# Patient Record
Sex: Female | Born: 1955
Health system: Southern US, Community
[De-identification: ages and names within clinical notes are randomized; demographics above are authoritative.]

## PROBLEM LIST (undated history)

## (undated) DIAGNOSIS — M62830 Muscle spasm of back: Secondary | ICD-10-CM

## (undated) DIAGNOSIS — I10 Essential (primary) hypertension: Secondary | ICD-10-CM

---

## 1984-11-30 DIAGNOSIS — R011 Cardiac murmur, unspecified: Secondary | ICD-10-CM

## 1984-11-30 HISTORY — DX: Cardiac murmur, unspecified: R01.1

## 1999-05-05 ENCOUNTER — Other Ambulatory Visit: Admission: RE | Admit: 1999-05-05 | Discharge: 1999-05-05 | Payer: Self-pay | Admitting: Gynecology

## 1999-07-08 ENCOUNTER — Ambulatory Visit (HOSPITAL_COMMUNITY): Admission: RE | Admit: 1999-07-08 | Discharge: 1999-07-08 | Payer: Self-pay | Admitting: Gynecology

## 1999-07-08 ENCOUNTER — Encounter (INDEPENDENT_AMBULATORY_CARE_PROVIDER_SITE_OTHER): Payer: Self-pay | Admitting: Specialist

## 1999-09-30 ENCOUNTER — Encounter: Payer: Self-pay | Admitting: Cardiology

## 1999-09-30 ENCOUNTER — Encounter: Payer: Self-pay | Admitting: Emergency Medicine

## 1999-09-30 ENCOUNTER — Inpatient Hospital Stay (HOSPITAL_COMMUNITY): Admission: EM | Admit: 1999-09-30 | Discharge: 1999-10-01 | Payer: Self-pay | Admitting: Emergency Medicine

## 2002-05-31 ENCOUNTER — Emergency Department (HOSPITAL_COMMUNITY): Admission: EM | Admit: 2002-05-31 | Discharge: 2002-06-01 | Payer: Self-pay

## 2002-06-22 ENCOUNTER — Other Ambulatory Visit: Admission: RE | Admit: 2002-06-22 | Discharge: 2002-06-22 | Payer: Self-pay | Admitting: Gynecology

## 2002-11-30 DIAGNOSIS — I10 Essential (primary) hypertension: Secondary | ICD-10-CM | POA: Insufficient documentation

## 2003-11-30 ENCOUNTER — Encounter: Payer: Self-pay | Admitting: Internal Medicine

## 2003-11-30 LAB — CONVERTED CEMR LAB

## 2004-10-07 ENCOUNTER — Encounter: Admission: RE | Admit: 2004-10-07 | Discharge: 2005-01-05 | Payer: Self-pay | Admitting: Internal Medicine

## 2004-10-28 ENCOUNTER — Ambulatory Visit: Payer: Self-pay | Admitting: Internal Medicine

## 2004-11-20 ENCOUNTER — Ambulatory Visit: Payer: Self-pay | Admitting: Internal Medicine

## 2005-01-06 ENCOUNTER — Ambulatory Visit: Payer: Self-pay | Admitting: Internal Medicine

## 2005-01-20 ENCOUNTER — Ambulatory Visit: Payer: Self-pay | Admitting: Internal Medicine

## 2005-01-29 ENCOUNTER — Encounter: Admission: RE | Admit: 2005-01-29 | Discharge: 2005-04-29 | Payer: Self-pay | Admitting: Internal Medicine

## 2005-05-19 ENCOUNTER — Ambulatory Visit: Payer: Self-pay | Admitting: Internal Medicine

## 2005-07-01 ENCOUNTER — Ambulatory Visit: Payer: Self-pay | Admitting: Internal Medicine

## 2005-10-05 ENCOUNTER — Ambulatory Visit: Payer: Self-pay | Admitting: Internal Medicine

## 2005-11-18 ENCOUNTER — Ambulatory Visit: Payer: Self-pay | Admitting: Internal Medicine

## 2005-11-30 DIAGNOSIS — E119 Type 2 diabetes mellitus without complications: Secondary | ICD-10-CM | POA: Insufficient documentation

## 2006-03-15 ENCOUNTER — Ambulatory Visit: Payer: Self-pay | Admitting: Internal Medicine

## 2006-04-06 ENCOUNTER — Ambulatory Visit: Payer: Self-pay | Admitting: Internal Medicine

## 2006-05-19 ENCOUNTER — Ambulatory Visit: Payer: Self-pay | Admitting: Internal Medicine

## 2006-05-27 ENCOUNTER — Ambulatory Visit: Payer: Self-pay | Admitting: Professional

## 2006-06-03 ENCOUNTER — Ambulatory Visit (HOSPITAL_COMMUNITY): Admission: RE | Admit: 2006-06-03 | Discharge: 2006-06-03 | Payer: Self-pay | Admitting: Internal Medicine

## 2006-06-03 ENCOUNTER — Ambulatory Visit: Payer: Self-pay | Admitting: Internal Medicine

## 2006-06-14 ENCOUNTER — Ambulatory Visit: Payer: Self-pay | Admitting: Professional

## 2006-06-28 ENCOUNTER — Ambulatory Visit: Payer: Self-pay | Admitting: Professional

## 2006-07-21 ENCOUNTER — Ambulatory Visit: Payer: Self-pay | Admitting: Internal Medicine

## 2006-08-05 ENCOUNTER — Encounter: Admission: RE | Admit: 2006-08-05 | Discharge: 2006-08-05 | Payer: Self-pay | Admitting: Occupational Medicine

## 2006-10-15 ENCOUNTER — Ambulatory Visit: Payer: Self-pay | Admitting: Internal Medicine

## 2006-11-30 DIAGNOSIS — Z780 Asymptomatic menopausal state: Secondary | ICD-10-CM | POA: Insufficient documentation

## 2006-12-01 ENCOUNTER — Ambulatory Visit: Payer: Self-pay | Admitting: Internal Medicine

## 2006-12-09 ENCOUNTER — Ambulatory Visit: Payer: Self-pay | Admitting: Professional

## 2007-01-07 ENCOUNTER — Ambulatory Visit: Payer: Self-pay | Admitting: Internal Medicine

## 2007-01-14 ENCOUNTER — Ambulatory Visit: Payer: Self-pay | Admitting: Internal Medicine

## 2007-01-14 LAB — CONVERTED CEMR LAB: Influenza B Ag: NEGATIVE

## 2007-03-02 ENCOUNTER — Ambulatory Visit: Payer: Self-pay | Admitting: Endocrinology

## 2007-03-02 LAB — CONVERTED CEMR LAB
Basophils Relative: 1.2 % — ABNORMAL HIGH (ref 0.0–1.0)
CO2: 32 meq/L (ref 19–32)
Calcium: 9.7 mg/dL (ref 8.4–10.5)
Chloride: 107 meq/L (ref 96–112)
Creatinine, Ser: 0.4 mg/dL (ref 0.4–1.2)
Eosinophils Relative: 3.2 % (ref 0.0–5.0)
Glucose, Bld: 112 mg/dL — ABNORMAL HIGH (ref 70–99)
Lymphocytes Relative: 40 % (ref 12.0–46.0)
Neutro Abs: 3.3 10*3/uL (ref 1.4–7.7)
Platelets: 274 10*3/uL (ref 150–400)
RBC: 4.45 M/uL (ref 3.87–5.11)
RDW: 13.6 % (ref 11.5–14.6)
TSH: 0.86 microintl units/mL (ref 0.35–5.50)
WBC: 6.8 10*3/uL (ref 4.5–10.5)

## 2007-03-22 ENCOUNTER — Ambulatory Visit: Payer: Self-pay | Admitting: Cardiology

## 2007-04-06 ENCOUNTER — Ambulatory Visit: Payer: Self-pay | Admitting: Internal Medicine

## 2007-05-23 ENCOUNTER — Ambulatory Visit: Payer: Self-pay | Admitting: Internal Medicine

## 2007-05-23 LAB — CONVERTED CEMR LAB
ALT: 15 units/L (ref 0–40)
AST: 24 units/L (ref 0–37)
Albumin: 3.9 g/dL (ref 3.5–5.2)
Bacteria, UA: NEGATIVE
Basophils Relative: 1.4 % — ABNORMAL HIGH (ref 0.0–1.0)
Bilirubin Urine: NEGATIVE
Crystals: NEGATIVE
Eosinophils Relative: 3.3 % (ref 0.0–5.0)
HCT: 36.9 % (ref 36.0–46.0)
Hemoglobin: 12.6 g/dL (ref 12.0–15.0)
Lymphocytes Relative: 43.4 % (ref 12.0–46.0)
Monocytes Absolute: 0.6 10*3/uL (ref 0.2–0.7)
Mucus, UA: NEGATIVE
Neutro Abs: 2.4 10*3/uL (ref 1.4–7.7)
Nitrite: NEGATIVE
RDW: 13.1 % (ref 11.5–14.6)
Specific Gravity, Urine: 1.02 (ref 1.000–1.03)
Total Bilirubin: 1.2 mg/dL (ref 0.3–1.2)
Urine Glucose: NEGATIVE mg/dL
Urobilinogen, UA: 1 (ref 0.0–1.0)
WBC: 5.8 10*3/uL (ref 4.5–10.5)

## 2007-06-07 ENCOUNTER — Encounter: Payer: Self-pay | Admitting: Internal Medicine

## 2007-06-07 DIAGNOSIS — K219 Gastro-esophageal reflux disease without esophagitis: Secondary | ICD-10-CM | POA: Insufficient documentation

## 2007-06-17 ENCOUNTER — Ambulatory Visit: Payer: Self-pay | Admitting: Internal Medicine

## 2007-07-01 ENCOUNTER — Emergency Department (HOSPITAL_COMMUNITY): Admission: EM | Admit: 2007-07-01 | Discharge: 2007-07-02 | Payer: Self-pay | Admitting: Emergency Medicine

## 2007-07-07 ENCOUNTER — Ambulatory Visit: Payer: Self-pay | Admitting: Professional

## 2007-09-07 ENCOUNTER — Encounter: Admission: RE | Admit: 2007-09-07 | Discharge: 2007-09-07 | Payer: Self-pay | Admitting: Family Medicine

## 2007-10-04 ENCOUNTER — Ambulatory Visit: Payer: Self-pay | Admitting: Internal Medicine

## 2007-10-04 DIAGNOSIS — F438 Other reactions to severe stress: Secondary | ICD-10-CM | POA: Insufficient documentation

## 2007-10-06 ENCOUNTER — Ambulatory Visit: Payer: Self-pay | Admitting: Gastroenterology

## 2007-10-07 ENCOUNTER — Telehealth (INDEPENDENT_AMBULATORY_CARE_PROVIDER_SITE_OTHER): Payer: Self-pay | Admitting: *Deleted

## 2007-10-07 LAB — CONVERTED CEMR LAB
BUN: 6 mg/dL (ref 6–23)
Calcium: 10.5 mg/dL (ref 8.4–10.5)
Chloride: 103 meq/L (ref 96–112)
Creatinine,U: 161.2 mg/dL
Direct LDL: 119.6 mg/dL
GFR calc Af Amer: 216 mL/min
GFR calc non Af Amer: 179 mL/min
Glucose, Bld: 98 mg/dL (ref 70–99)
Hgb A1c MFr Bld: 6.5 % — ABNORMAL HIGH (ref 4.6–6.0)
Microalb Creat Ratio: 18 mg/g (ref 0.0–30.0)
Microalb, Ur: 2.9 mg/dL — ABNORMAL HIGH (ref 0.0–1.9)
Total CHOL/HDL Ratio: 3
Triglycerides: 70 mg/dL (ref 0–149)
VLDL: 14 mg/dL (ref 0–40)

## 2007-10-20 ENCOUNTER — Ambulatory Visit: Payer: Self-pay | Admitting: Internal Medicine

## 2007-10-20 DIAGNOSIS — E785 Hyperlipidemia, unspecified: Secondary | ICD-10-CM | POA: Insufficient documentation

## 2007-10-20 DIAGNOSIS — E876 Hypokalemia: Secondary | ICD-10-CM | POA: Insufficient documentation

## 2008-01-19 ENCOUNTER — Emergency Department (HOSPITAL_COMMUNITY): Admission: EM | Admit: 2008-01-19 | Discharge: 2008-01-20 | Payer: Self-pay | Admitting: Emergency Medicine

## 2008-03-02 ENCOUNTER — Encounter: Payer: Self-pay | Admitting: Internal Medicine

## 2008-03-06 ENCOUNTER — Encounter: Payer: Self-pay | Admitting: Internal Medicine

## 2008-04-02 ENCOUNTER — Telehealth: Payer: Self-pay | Admitting: Internal Medicine

## 2008-04-03 ENCOUNTER — Inpatient Hospital Stay (HOSPITAL_COMMUNITY): Admission: EM | Admit: 2008-04-03 | Discharge: 2008-04-05 | Payer: Self-pay | Admitting: Emergency Medicine

## 2008-04-04 ENCOUNTER — Encounter (INDEPENDENT_AMBULATORY_CARE_PROVIDER_SITE_OTHER): Payer: Self-pay | Admitting: Cardiovascular Disease

## 2008-04-04 ENCOUNTER — Ambulatory Visit: Payer: Self-pay | Admitting: Vascular Surgery

## 2008-04-04 ENCOUNTER — Encounter (INDEPENDENT_AMBULATORY_CARE_PROVIDER_SITE_OTHER): Payer: Self-pay | Admitting: Cardiology

## 2008-04-13 ENCOUNTER — Encounter: Payer: Self-pay | Admitting: Internal Medicine

## 2008-06-19 ENCOUNTER — Encounter: Payer: Self-pay | Admitting: Internal Medicine

## 2008-08-23 ENCOUNTER — Ambulatory Visit: Payer: Self-pay | Admitting: Nurse Practitioner

## 2008-08-23 ENCOUNTER — Encounter (INDEPENDENT_AMBULATORY_CARE_PROVIDER_SITE_OTHER): Payer: Self-pay | Admitting: Internal Medicine

## 2008-08-23 DIAGNOSIS — G5602 Carpal tunnel syndrome, left upper limb: Secondary | ICD-10-CM | POA: Insufficient documentation

## 2008-08-23 DIAGNOSIS — G5600 Carpal tunnel syndrome, unspecified upper limb: Secondary | ICD-10-CM | POA: Insufficient documentation

## 2008-08-23 LAB — CONVERTED CEMR LAB: Blood Glucose, Fingerstick: 76

## 2008-08-24 DIAGNOSIS — H269 Unspecified cataract: Secondary | ICD-10-CM | POA: Insufficient documentation

## 2008-08-24 DIAGNOSIS — R011 Cardiac murmur, unspecified: Secondary | ICD-10-CM | POA: Insufficient documentation

## 2008-10-05 ENCOUNTER — Ambulatory Visit: Payer: Self-pay | Admitting: Nurse Practitioner

## 2008-10-05 LAB — CONVERTED CEMR LAB
ALT: 14 units/L (ref 0–35)
Albumin: 4.6 g/dL (ref 3.5–5.2)
Alkaline Phosphatase: 56 units/L (ref 39–117)
Basophils Absolute: 0 10*3/uL (ref 0.0–0.1)
Blood Glucose, Fingerstick: 123
CO2: 26 meq/L (ref 19–32)
Cholesterol, target level: 200 mg/dL
Cholesterol: 152 mg/dL (ref 0–200)
Eosinophils Relative: 4 % (ref 0–5)
Glucose, Bld: 114 mg/dL — ABNORMAL HIGH (ref 70–99)
HCT: 40.8 % (ref 36.0–46.0)
HDL goal, serum: 40 mg/dL
LDL Cholesterol: 71 mg/dL (ref 0–99)
LDL Goal: 100 mg/dL
Lymphocytes Relative: 39 % (ref 12–46)
Lymphs Abs: 1.9 10*3/uL (ref 0.7–4.0)
Neutrophils Relative %: 45 % (ref 43–77)
Platelets: 273 10*3/uL (ref 150–400)
Potassium: 3.6 meq/L (ref 3.5–5.3)
RDW: 14.4 % (ref 11.5–15.5)
Sodium: 142 meq/L (ref 135–145)
TSH: 0.775 microintl units/mL (ref 0.350–4.50)
Total Bilirubin: 1.3 mg/dL — ABNORMAL HIGH (ref 0.3–1.2)
Total Protein: 7.7 g/dL (ref 6.0–8.3)
VLDL: 14 mg/dL (ref 0–40)
WBC: 4.8 10*3/uL (ref 4.0–10.5)

## 2008-10-06 ENCOUNTER — Encounter (INDEPENDENT_AMBULATORY_CARE_PROVIDER_SITE_OTHER): Payer: Self-pay | Admitting: Nurse Practitioner

## 2008-10-06 LAB — CONVERTED CEMR LAB
Creatinine, Urine: 56.7 mg/dL
Microalb, Ur: 0.82 mg/dL (ref 0.00–1.89)

## 2008-12-05 ENCOUNTER — Ambulatory Visit: Payer: Self-pay | Admitting: Nurse Practitioner

## 2008-12-05 ENCOUNTER — Encounter (INDEPENDENT_AMBULATORY_CARE_PROVIDER_SITE_OTHER): Payer: Self-pay | Admitting: Nurse Practitioner

## 2008-12-05 LAB — CONVERTED CEMR LAB
Nitrite: NEGATIVE
Protein, U semiquant: NEGATIVE
Urobilinogen, UA: 0.2
WBC Urine, dipstick: NEGATIVE

## 2008-12-10 ENCOUNTER — Encounter (INDEPENDENT_AMBULATORY_CARE_PROVIDER_SITE_OTHER): Payer: Self-pay | Admitting: Nurse Practitioner

## 2009-01-07 ENCOUNTER — Ambulatory Visit: Payer: Self-pay | Admitting: *Deleted

## 2009-01-14 ENCOUNTER — Ambulatory Visit: Payer: Self-pay | Admitting: Nurse Practitioner

## 2009-01-14 LAB — CONVERTED CEMR LAB
Magnesium: 1.7 mg/dL (ref 1.5–2.5)
Potassium: 4.1 meq/L (ref 3.5–5.3)

## 2009-03-05 ENCOUNTER — Ambulatory Visit: Payer: Self-pay | Admitting: Nurse Practitioner

## 2009-03-05 DIAGNOSIS — M25539 Pain in unspecified wrist: Secondary | ICD-10-CM | POA: Insufficient documentation

## 2009-03-05 LAB — CONVERTED CEMR LAB
Blood Glucose, Fingerstick: 103
Hgb A1c MFr Bld: 7.3 %

## 2009-04-18 ENCOUNTER — Ambulatory Visit: Payer: Self-pay | Admitting: Nurse Practitioner

## 2009-04-18 LAB — CONVERTED CEMR LAB: Potassium: 4.5 meq/L (ref 3.5–5.3)

## 2009-04-22 ENCOUNTER — Encounter (INDEPENDENT_AMBULATORY_CARE_PROVIDER_SITE_OTHER): Payer: Self-pay | Admitting: Nurse Practitioner

## 2009-06-07 ENCOUNTER — Telehealth (INDEPENDENT_AMBULATORY_CARE_PROVIDER_SITE_OTHER): Payer: Self-pay | Admitting: Nurse Practitioner

## 2009-07-08 ENCOUNTER — Ambulatory Visit: Payer: Self-pay | Admitting: Nurse Practitioner

## 2009-07-17 ENCOUNTER — Ambulatory Visit (HOSPITAL_COMMUNITY): Admission: RE | Admit: 2009-07-17 | Discharge: 2009-07-17 | Payer: Self-pay | Admitting: Internal Medicine

## 2009-11-08 ENCOUNTER — Telehealth (INDEPENDENT_AMBULATORY_CARE_PROVIDER_SITE_OTHER): Payer: Self-pay | Admitting: Nurse Practitioner

## 2009-11-13 ENCOUNTER — Ambulatory Visit: Payer: Self-pay | Admitting: Nurse Practitioner

## 2009-11-13 LAB — CONVERTED CEMR LAB
ALT: 13 units/L (ref 0–35)
Blood in Urine, dipstick: NEGATIVE
CO2: 22 meq/L (ref 19–32)
Calcium: 9.7 mg/dL (ref 8.4–10.5)
Chloride: 104 meq/L (ref 96–112)
Cholesterol: 163 mg/dL (ref 0–200)
Creatinine, Ser: 0.39 mg/dL — ABNORMAL LOW (ref 0.40–1.20)
Eosinophils Absolute: 0.2 10*3/uL (ref 0.0–0.7)
Glucose, Urine, Semiquant: NEGATIVE
Hemoglobin: 13.7 g/dL (ref 12.0–15.0)
Lymphs Abs: 2.2 10*3/uL (ref 0.7–4.0)
MCV: 87.8 fL (ref 78.0–100.0)
Monocytes Relative: 6 % (ref 3–12)
Neutrophils Relative %: 50 % (ref 43–77)
Nitrite: NEGATIVE
Protein, U semiquant: 30
RBC: 4.74 M/uL (ref 3.87–5.11)
Sodium: 143 meq/L (ref 135–145)
Total Protein: 7.9 g/dL (ref 6.0–8.3)
Urobilinogen, UA: 0.2
WBC Urine, dipstick: NEGATIVE
WBC: 5.5 10*3/uL (ref 4.0–10.5)
pH: 8

## 2009-12-02 ENCOUNTER — Encounter (INDEPENDENT_AMBULATORY_CARE_PROVIDER_SITE_OTHER): Payer: Self-pay | Admitting: Nurse Practitioner

## 2009-12-16 ENCOUNTER — Ambulatory Visit: Payer: Self-pay | Admitting: Nurse Practitioner

## 2010-01-28 ENCOUNTER — Ambulatory Visit: Payer: Self-pay | Admitting: Nurse Practitioner

## 2010-01-28 LAB — CONVERTED CEMR LAB
Bilirubin Urine: NEGATIVE
Blood Glucose, Fingerstick: 97
Glucose, Urine, Semiquant: NEGATIVE
KOH Prep: NEGATIVE
Ketones, urine, test strip: NEGATIVE
Specific Gravity, Urine: 1.01

## 2010-01-29 LAB — CONVERTED CEMR LAB: OCCULT 1: NEGATIVE

## 2010-01-30 ENCOUNTER — Encounter (INDEPENDENT_AMBULATORY_CARE_PROVIDER_SITE_OTHER): Payer: Self-pay | Admitting: Nurse Practitioner

## 2010-01-30 LAB — CONVERTED CEMR LAB: Pap Smear: NEGATIVE

## 2010-03-27 ENCOUNTER — Telehealth (INDEPENDENT_AMBULATORY_CARE_PROVIDER_SITE_OTHER): Payer: Self-pay | Admitting: Nurse Practitioner

## 2010-06-24 ENCOUNTER — Ambulatory Visit: Payer: Self-pay | Admitting: Nurse Practitioner

## 2010-06-26 ENCOUNTER — Ambulatory Visit: Payer: Self-pay | Admitting: Internal Medicine

## 2010-08-05 ENCOUNTER — Ambulatory Visit: Payer: Self-pay | Admitting: Nurse Practitioner

## 2010-08-05 LAB — CONVERTED CEMR LAB: Rapid HIV Screen: NEGATIVE

## 2010-08-06 LAB — CONVERTED CEMR LAB
Albumin: 4.4 g/dL (ref 3.5–5.2)
CO2: 29 meq/L (ref 19–32)
Calcium: 9.6 mg/dL (ref 8.4–10.5)
Chloride: 103 meq/L (ref 96–112)
Cholesterol: 159 mg/dL (ref 0–200)
Eosinophils Relative: 4 % (ref 0–5)
Glucose, Bld: 115 mg/dL — ABNORMAL HIGH (ref 70–99)
HCT: 39.4 % (ref 36.0–46.0)
Hemoglobin: 13 g/dL (ref 12.0–15.0)
Hgb A1c MFr Bld: 7.3 % — ABNORMAL HIGH (ref <5.7)
Lymphocytes Relative: 38 % (ref 12–46)
Lymphs Abs: 2.2 10*3/uL (ref 0.7–4.0)
Neutro Abs: 2.9 10*3/uL (ref 1.7–7.7)
Platelets: 289 10*3/uL (ref 150–400)
Sodium: 144 meq/L (ref 135–145)
Total Bilirubin: 1.6 mg/dL — ABNORMAL HIGH (ref 0.3–1.2)
Total Protein: 7.1 g/dL (ref 6.0–8.3)
Triglycerides: 87 mg/dL (ref <150)
VLDL: 17 mg/dL (ref 0–40)
WBC: 6 10*3/uL (ref 4.0–10.5)

## 2010-08-07 ENCOUNTER — Encounter (INDEPENDENT_AMBULATORY_CARE_PROVIDER_SITE_OTHER): Payer: Self-pay | Admitting: Nurse Practitioner

## 2010-08-12 ENCOUNTER — Ambulatory Visit (HOSPITAL_COMMUNITY): Admission: RE | Admit: 2010-08-12 | Discharge: 2010-08-12 | Payer: Self-pay | Admitting: Internal Medicine

## 2010-08-18 LAB — CONVERTED CEMR LAB
Bilirubin, Direct: 0.3 mg/dL (ref 0.0–0.3)
Indirect Bilirubin: 1.2 mg/dL — ABNORMAL HIGH (ref 0.0–0.9)
Total Bilirubin: 1.5 mg/dL — ABNORMAL HIGH (ref 0.3–1.2)

## 2010-08-19 ENCOUNTER — Ambulatory Visit: Payer: Self-pay | Admitting: Nurse Practitioner

## 2010-08-20 ENCOUNTER — Encounter (INDEPENDENT_AMBULATORY_CARE_PROVIDER_SITE_OTHER): Payer: Self-pay | Admitting: Nurse Practitioner

## 2010-08-20 LAB — CONVERTED CEMR LAB: Retic Ct Pct: 1.1 % (ref 0.4–3.1)

## 2010-08-27 ENCOUNTER — Telehealth (INDEPENDENT_AMBULATORY_CARE_PROVIDER_SITE_OTHER): Payer: Self-pay | Admitting: Nurse Practitioner

## 2010-10-27 ENCOUNTER — Telehealth (INDEPENDENT_AMBULATORY_CARE_PROVIDER_SITE_OTHER): Payer: Self-pay | Admitting: *Deleted

## 2010-10-29 ENCOUNTER — Encounter (INDEPENDENT_AMBULATORY_CARE_PROVIDER_SITE_OTHER): Payer: Self-pay | Admitting: Nurse Practitioner

## 2010-12-04 ENCOUNTER — Telehealth (INDEPENDENT_AMBULATORY_CARE_PROVIDER_SITE_OTHER): Payer: Self-pay | Admitting: Nurse Practitioner

## 2010-12-21 ENCOUNTER — Encounter: Payer: Self-pay | Admitting: Internal Medicine

## 2010-12-30 NOTE — Letter (Signed)
Summary: *HSN Results Follow up  [facility]  [address] [place], [place] [address]   Phone: [phone]  Fax: [phone]      12/02/2009   Sharon Munoz [address] [place], [place]  [address]   Dear  Ms. [facility],                            ____S.Drinkard,FNP   ____D. Gore,FNP       ____B. McPherson,MD   ____V. Rankins,MD    ____E. Mulberry,MD    __X__N. Daphine Deutscher, FNP  ____D. Reche Dixon, MD    ____K. Philipp Deputy, MD    ____Other     This letter is to inform you that your recent test(s):  _______Pap Smear    ___X____Lab Test     _______X-ray    ____X___ is within acceptable limits  _______ requires a medication change  _______ requires a follow-up lab visit  _______ requires a follow-up visit with your provider   Comments: Labs ok done during recent office visit.       _________________________________________________________ If you have any questions, please contact our office [phone].                    Sincerely,    Lehman Prom FNP [facility]

## 2010-12-30 NOTE — Letter (Signed)
Summary: [place] [employer] Francia Greaves CERTIFICATE/PT TO PICK UP  [facility] [employer] /EXAM CERTIFICATE/PT TO PICK UP   Imported By: Arta Bruce 10/29/2010 09:57:07  _____________________________________________________________________  External Attachment:    Type:   Image     Comment:   External Document

## 2010-12-30 NOTE — Assessment & Plan Note (Signed)
Summary: Complete Physical Exam   Vital Signs:  Patient profile:   55 year old female Menstrual status:  perimenopausal Weight:      143.5 pounds BMI:     26.34 BSA:     1.66 Temp:     97.3 degrees F oral Pulse rate:   59 / minute Pulse rhythm:   regular Resp:     16 per minute BP sitting:   150 / 92  (left arm) Cuff size:   regular  Vitals Entered By: Levon Hedger (January 28, 2010 9:46 AM) CC: CPP, Hypertension Management, Lipid Management Is Patient Diabetic? Yes Pain Assessment Patient in pain? yes     Location: hand CBG Result 97 CBG Device ID A  Does patient need assistance? Functional Status Self care Ambulation Normal  Vision Screening:      Vision Comments: 12/2010     Menstrual Status perimenopausal Last PAP Result  Specimen Adequacy: Satisfactory for evaluation.   Interpretation/Result:Negative for intraepithelial Lesion or Malignancy.      CC:  CPP, Hypertension Management, and Lipid Management.  History of Present Illness:  Pt into the office for a complete physical exam  PAP - done last 1 year ago.  All previous pap have been normal. Postmenopausal. No cervical cancer or ovarian cancer Pt is sexually active.  She has a new partner.  Mammogram - last done on 07/17/2009.   self breast at home no family hx of breast cancer  Optho - last done 2 weeks ago.  Pt has received her Rx for glasses but has not been able to afford them.  no diabetic retinopathy.  Dental - pt has some dentures but she has not been to the dentist recently  Tdap - last done in 2005  Diabetes - doing well.  Checks once daily before meals.  Diabetes Management History:      The patient is a 55 years old female who comes in for evaluation of DM Type 2.  She has not been enrolled in the "Diabetic Education Program".  She states understanding of dietary principles and is following her diet appropriately.  No sensory loss is reported.  Self foot exams are being performed.   She is checking home blood sugars.  She says that she is exercising.  Type of exercise includes: bike.  Duration of exercise is estimated to be 30-60  She is doing this 3 times per week.        Hypoglycemic symptoms are not occurring.  No hyperglycemic symptoms are reported.        There are no symptoms to suggest diabetic complications.  No changes have been made to her treatment plan since last visit.    Hypertension History:      She denies headache, chest pain, and palpitations.  She notes no problems with any antihypertensive medication side effects.  Pt has taken her meds today but is fasting.  Last BP check was ok.        Positive major cardiovascular risk factors include diabetes, hyperlipidemia, and hypertension.  Negative major cardiovascular risk factors include female age less than 21 years old and non-tobacco-user status.        Further assessment for target organ damage reveals no history of ASHD, cardiac end-organ damage (CHF/LVH), stroke/TIA, peripheral vascular disease, renal insufficiency, or hypertensive retinopathy.    Lipid Management History:      Positive NCEP/ATP III risk factors include diabetes and hypertension.  Negative NCEP/ATP III risk factors include female age less  than 45 years old, HDL cholesterol greater than 60, non-tobacco-user status, no ASHD (atherosclerotic heart disease), no prior stroke/TIA, no peripheral vascular disease, and no history of aortic aneurysm.        The patient states that she does not know about the "Therapeutic Lifestyle Change" diet.  The patient does not know about adjunctive measures for cholesterol lowering.  Adjunctive measures started by the patient include ASA.  She expresses no side effects from her lipid-lowering medication.  The patient denies any symptoms to suggest myopathy or liver disease.      Habits & Providers  Alcohol-Tobacco-Diet     Alcohol drinks/day: 0     Tobacco Status: never  Exercise-Depression-Behavior      Does Patient Exercise: yes     Exercise Counseling: not indicated; exercise is adequate     Type of exercise: bike     Exercise (avg: min/session): 30-60     Times/week: 3     Have you felt down or hopeless? no     Have you felt little pleasure in things? no     Depression Counseling: not indicated; screening negative for depression     Drug Use: never  Comments: PHQ- 9 score = 3  Medications Prior to Update: 1)  Norvasc 10 Mg  Tabs (Amlodipine Besylate) .Marland Kitchen.. 1 Tablet By Mouth Daily For Blood Pressure 2)  Aspirin Ec Low Strength 81 Mg  Tbec (Aspirin) .... On By Mouth Once Daily 3)  Glucophage 500 Mg  Tabs (Metformin Hcl) .... One Tablet By Mouth Daily For Blood Sugar 4)  Klor-Con 10 10 Meq Cr-Tabs (Potassium Chloride) .... One Tablet By Mouth Daily 5)  Lisinopril 10 Mg Tabs (Lisinopril) .Marland Kitchen.. 1 Tablet By Mouth Daily For Blood Pressure/kidneys 6)  Atenolol 50 Mg Tabs (Atenolol) .... 1/2 Tablet By Mouth Two Times A Day 7)  Triamcinolone Acetonide 0.1 %  Crea (Triamcinolone Acetonide) .... Apply To Affected Area As Needed Two Times A Day 8)  Glucometer Elite Classic  Kit (Blood Glucose Monitoring Suppl) .... Check Blood Sugar Daily Before Breakfast Dx 250.00 Dispense Glucometer, Lancets and Test Strips 9)  Lac-Hydrin 12 % Lotn (Ammonium Lactate) .... Apply To Dry Skin Daily As Needed  Allergies (verified): 1)  ! Codeine  Review of Systems General:  Denies fever and sleep disorder. Eyes:  Denies blurring. ENT:  Denies earache. CV:  Denies fatigue. Resp:  Denies cough. GI:  Denies abdominal pain, nausea, and vomiting. GU:  Denies discharge; elevated sexual drive. MS:  Denies joint pain. Derm:  Denies rash. Neuro:  Denies headaches. Psych:  Denies anxiety and depression. Endo:  Denies excessive urination.  Physical Exam  General:  alert.   Head:  normocephalic.   Eyes:  vision grossly intact, pupils equal, and pupils round.   Ears:  bil TM with bony landmarks present Nose:  no  nasal discharge.   Mouth:  upper partial pharynx pink and moist.   Neck:  supple.   Chest Wall:  no mass.   Breasts:  skin/areolae normal, no masses, and no abnormal thickening.   Lungs:  normal breath sounds.   Heart:  normal rate and regular rhythm.   Abdomen:  soft, non-tender, and normal bowel sounds.   Rectal:  external hemorrhoid(s).   Msk:  up to the exam table Pulses:  R radial normal and R dorsalis pedis normal.   Extremities:  no edema Neurologic:  alert & oriented X3.   Skin:  color normal.   Psych:  Oriented X3.  Pelvic Exam  Vulva:      normal appearance.   Urethra and Bladder:      Urethra--normal.   Vagina:      physiologic discharge.   Cervix:      midposition.   Adnexa:      nontender bilaterally.   Rectum:      heme negative stool, + external hemorrhoids.    Diabetes Management Exam:    Foot Exam (with socks and/or shoes not present):       Inspection:          Left foot: normal          Right foot: normal       Nails:          Left foot: normal          Right foot: normal    Eye Exam:       Eye Exam done elsewhere          Date: 01/13/2010          Results: normal          Done by: Dr. Benedetto Goad    Impression & Recommendations:  Problem # 1:  ROUTINE GYNECOLOGICAL EXAMINATION (ICD-V72.31) labs reviewed from previous visit PAP done optho up to date rec dental exam colonscopy indications given guaiac negative PHQ-9 score = 3 Orders: KOH/ WET Mount [phone]) Pap Smear, Thin Prep ( Collection of) (O1308) UA Dipstick w/o Micro (manual) (65784) T- GC Chlamydia (69629) Hemoccult Guaiac-1 spec.(in office) (82270)  Problem # 2:  OTHER SCREENING BREAST EXAMINATION (ICD-V76.19) continue self breast exam mammogram not due until august 2011  Problem # 3:  DIABETES MELLITUS, TYPE II (ICD-250.00) stable pt to continue with meds Her updated medication list for this problem includes:    Aspirin Ec Low Strength 81 Mg Tbec (Aspirin) ..... On by  mouth once daily    Glucophage 500 Mg Tabs (Metformin hcl) ..... One tablet by mouth daily for blood sugar    Lisinopril 10 Mg Tabs (Lisinopril) .Marland Kitchen... 1 tablet by mouth daily for blood pressure/kidneys  Orders: Capillary Blood Glucose/CBG (82948) Hemoglobin A1C (83036) T-Urine Microalbumin w/creat. ratio [phone])  Problem # 4:  HYPERTENSION (ICD-401.9) DASH diet continue current meds Her updated medication list for this problem includes:    Norvasc 10 Mg Tabs (Amlodipine besylate) .Marland Kitchen... 1 tablet by mouth daily for blood pressure    Lisinopril 10 Mg Tabs (Lisinopril) .Marland Kitchen... 1 tablet by mouth daily for blood pressure/kidneys    Atenolol 50 Mg Tabs (Atenolol) .Marland Kitchen... 1/2 tablet by mouth two times a day  Orders: T-Urine Microalbumin w/creat. ratio [phone])  Problem # 5:  HYPERLIPIDEMIA (ICD-272.4) stable  Complete Medication List: 1)  Norvasc 10 Mg Tabs (Amlodipine besylate) .Marland Kitchen.. 1 tablet by mouth daily for blood pressure 2)  Aspirin Ec Low Strength 81 Mg Tbec (Aspirin) .... On by mouth once daily 3)  Glucophage 500 Mg Tabs (Metformin hcl) .... One tablet by mouth daily for blood sugar 4)  Klor-con 10 10 Meq Cr-tabs (Potassium chloride) .... One tablet by mouth daily 5)  Lisinopril 10 Mg Tabs (Lisinopril) .Marland Kitchen.. 1 tablet by mouth daily for blood pressure/kidneys 6)  Atenolol 50 Mg Tabs (Atenolol) .... 1/2 tablet by mouth two times a day 7)  Triamcinolone Acetonide 0.1 % Crea (Triamcinolone acetonide) .... Apply to affected area as needed two times a day 8)  Glucometer Elite Classic Kit (Blood glucose monitoring suppl) .... Check blood sugar daily before breakfast dx 250.00 dispense glucometer,  lancets and test strips 9)  Lac-hydrin 12 % Lotn (Ammonium lactate) .... Apply to dry skin daily as needed  Diabetes Management Assessment/Plan:      The following lipid goals have been established for the patient: Total cholesterol goal of 200; LDL cholesterol goal of 100; HDL  cholesterol goal of 40; Triglyceride goal of 150.  Her blood pressure goal is < 130/80.    Hypertension Assessment/Plan:      The patient's hypertensive risk group is category C: Target organ damage and/or diabetes.  Her calculated 10 year risk of coronary heart disease is 9 %.  Today's blood pressure is 150/92.  Her blood pressure goal is < 130/80.  Lipid Assessment/Plan:      Based on NCEP/ATP III, the patient's risk factor category is "history of diabetes".  The patient's lipid goals are as follows: Total cholesterol goal is 200; LDL cholesterol goal is 100; HDL cholesterol goal is 40; Triglyceride goal is 150.     Patient Instructions: 1)  Continue your current medications. 2)  Your diabetes is doing well. 3)  Blood pressure is slightly elevated today but was ok during last check. Continue current medications 4)  Follow up in 6 months for diabetes and blood sugar.  Laboratory Results   Urine Tests  Date/Time Received: January 28, 2010 10:00 AM  Date/Time Reported: January 28, 2010 10:00 AM   Routine Urinalysis   Color: yellow Appearance: Clear Glucose: negative   (Normal Range: Negative) Bilirubin: negative   (Normal Range: Negative) Ketone: negative   (Normal Range: Negative) Spec. Gravity: 1.010   (Normal Range: 1.003-1.035) Blood: negative   (Normal Range: Negative) pH: 7.0   (Normal Range: 5.0-8.0) Protein: 30   (Normal Range: Negative) Urobilinogen: 0.2   (Normal Range: 0-1) Nitrite: negative   (Normal Range: Negative) Leukocyte Esterace: negative   (Normal Range: Negative)     Blood Tests     CBG Random:: 97    Wet Mount/KOH Source: vaginal WBC/hpf: 1-5 Bacteria/hpf: rare Clue cells/hpf: none Yeast/hpf: none Trichomonas/hpf: none  Stool - Occult Blood Hemmoccult #1: negative Date: 01/29/2010   Diabetic Foot Exam Foot Inspection Is there a history of a foot ulcer?              No Is there a foot ulcer now?              No Can the patient see the  bottom of their feet?          Yes Are the shoes appropriate in style and fit?          Yes Is there swelling or an abnormal foot shape?          No Are the toenails long?                No Are the toenails thick?                No Are the toenails ingrown?              No Is there heavy callous build-up?              No Is there pain in the calf muscle (Intermittent claudication) when walking?    NoIs there a claw toe deformity?              No Is there elevated skin temperature?            No Is there limited ankle dorsiflexion?  No Is there foot or ankle muscle weakness?            No  Diabetic Foot Care Education Pulse Check          Right Foot          Left Foot Dorsalis Pedis:        normal            normal  Set Next Diabetic Foot Exam here: 04/30/2010   Prevention & Chronic Care Immunizations   Influenza vaccine: Fluvax 3+  (12/05/2008)    Tetanus booster: 12/01/2003: historical per pt    Pneumococcal vaccine: Pneumovax  (10/05/2008)  Colorectal Screening   Hemoccult: Done  (11/30/2003)   Hemoccult action/deferral: Ordered  (01/28/2010)   Hemoccult due: 01/29/2011    Colonoscopy: Not documented   Colonoscopy action/deferral: patient defers today but will consider in the future  (12/05/2008)  Other Screening   Pap smear:  Specimen Adequacy: Satisfactory for evaluation.   Interpretation/Result:Negative for intraepithelial Lesion or Malignancy.     (12/05/2008)   Pap smear action/deferral: PAP smear done  (12/05/2008)    Mammogram: ASSESSMENT: Negative - BI-RADS 1^MM DIGITAL SCREENING  (07/17/2009)   Mammogram action/deferral: mammogram ordered  (12/05/2008)   Smoking status: never  (01/28/2010)  Diabetes Mellitus   HgbA1C: 6.1  (11/13/2009)   HgbA1C action/deferral: Ordered  (01/28/2010)    Eye exam: normal  (01/13/2010)    Foot exam: yes  (01/28/2010)   Foot exam action/deferral: Do today   High risk foot: Not documented   Foot care education:  Not documented   Foot exam due: 04/30/2010    Urine microalbumin/creatinine ratio: 18.0  (10/04/2007)   Urine microalbumin action/deferral: Ordered  Lipids   Total Cholesterol: 163  (11/13/2009)   LDL: 84  (11/13/2009)   LDL Direct: 119.6  (10/04/2007)   HDL: 64  (11/13/2009)   Triglycerides: 74  (11/13/2009)    SGOT (AST): 21  (11/13/2009)   SGPT (ALT): 13  (11/13/2009)   Alkaline phosphatase: 61  (11/13/2009)   Total bilirubin: 1.3  (11/13/2009)  Hypertension   Last Blood Pressure: 150 / 92  (01/28/2010)   Serum creatinine: 0.39  (11/13/2009)   Serum potassium 3.7  (11/13/2009)  Self-Management Support :    Diabetes self-management support: Not documented    Hypertension self-management support: Not documented    Lipid self-management support: Not documented    Nursing Instructions: CBG today (see order)    Laboratory Results   Urine Tests    Routine Urinalysis   Color: yellow Appearance: Clear Glucose: negative   (Normal Range: Negative) Bilirubin: negative   (Normal Range: Negative) Ketone: negative   (Normal Range: Negative) Spec. Gravity: 1.010   (Normal Range: 1.003-1.035) Blood: negative   (Normal Range: Negative) pH: 7.0   (Normal Range: 5.0-8.0) Protein: 30   (Normal Range: Negative) Urobilinogen: 0.2   (Normal Range: 0-1) Nitrite: negative   (Normal Range: Negative) Leukocyte Esterace: negative   (Normal Range: Negative)     Blood Tests     CBG Random:: 97mg /dL    DIRECTV KOH: Negative

## 2010-12-30 NOTE — Assessment & Plan Note (Signed)
Summary: HTN/Diabetes   Vital Signs:  Patient profile:   55 year old female Menstrual status:  perimenopausal Height:      62 inches Weight:      145.8 pounds Temp:     98.1 degrees F oral Pulse rate:   74 / minute Pulse rhythm:   regular Resp:     18 per minute BP sitting:   140 / 80  (right arm) Cuff size:   regular  Vitals Entered By: Michelle Nasuti (August 05, 2010 8:40 AM) CC: htn and dm follow up, Hypertension Management, Lipid Management, Abdominal Pain Is Patient Diabetic? Yes Pain Assessment Patient in pain? no      CBG Result 113 CBG Device ID [email or web address]  Does patient need assistance? Functional Status Self care Ambulation Normal    CC:  htn and dm follow up, Hypertension Management, Lipid Management, and Abdominal Pain.  History of Present Illness:  Pt into the office for 3 month f/u.  Diabetes Management History:      The patient is a 55 years old female who comes in for evaluation of Type 2 Diabetes Mellitus.  She has not been enrolled in the "Diabetic Education Program".  She states lack of understanding of dietary principles and is not following her diet appropriately.  No sensory loss is reported.  Self foot exams are not being performed.  She is checking home blood sugars.  She says that she is exercising.  Type of exercise includes: bike.  Duration of exercise is estimated to be 30-60  She is doing this 3 times per week.        Hypoglycemic symptoms are not occurring.  No hyperglycemic symptoms are reported.        There are no symptoms to suggest diabetic complications.  No changes have been made to her treatment plan since last visit.    Dyspepsia History:      She has no alarm features of dyspepsia including no history of melena, hematochezia, dysphagia, persistent vomiting, or involuntary weight loss > 5%.  There is a prior history of GERD.  The patient does not have a prior history of documented ulcer disease.  The dominant symptom is not  heartburn or acid reflux.  An H-2 blocker medication is not currently being taken.  She has no history of a positive H. Pylori serology.  No previous upper endoscopy has been done.    Hypertension History:      She denies headache, chest pain, and palpitations.  She notes no problems with any antihypertensive medication side effects.        Positive major cardiovascular risk factors include diabetes, hyperlipidemia, and hypertension.  Negative major cardiovascular risk factors include female age less than 21 years old and non-tobacco-Ibrohim Simmers status.        Further assessment for target organ damage reveals no history of ASHD, cardiac end-organ damage (CHF/LVH), stroke/TIA, peripheral vascular disease, renal insufficiency, or hypertensive retinopathy.    Lipid Management History:      Positive NCEP/ATP III risk factors include diabetes and hypertension.  Negative NCEP/ATP III risk factors include female age less than 70 years old, HDL cholesterol greater than 60, non-tobacco-Hamdan Toscano status, no ASHD (atherosclerotic heart disease), no prior stroke/TIA, no peripheral vascular disease, and no history of aortic aneurysm.        The patient states that she does not know about the "Therapeutic Lifestyle Change" diet.  The patient does not know about adjunctive measures for cholesterol lowering.  Adjunctive measures started by the patient include ASA.  She expresses no side effects from her lipid-lowering medication.  The patient denies any symptoms to suggest myopathy or liver disease.       Habits & Providers  Alcohol-Tobacco-Diet     Alcohol drinks/day: 0     Tobacco Status: never  Exercise-Depression-Behavior     Does Patient Exercise: yes     Exercise Counseling: not indicated; exercise is adequate     Type of exercise: bike     Exercise (avg: min/session): 30-60     Times/week: 3     Have you felt down or hopeless? no     Have you felt little pleasure in things? no     Depression Counseling: not  indicated; screening negative for depression     Drug Use: never  Current Medications (verified): 1)  Norvasc 10 Mg  Tabs (Amlodipine Besylate) .Marland Kitchen.. 1 Tablet By Mouth Daily For Blood Pressure 2)  Aspirin Ec Low Strength 81 Mg  Tbec (Aspirin) .... On By Mouth Once Daily 3)  Glucophage 500 Mg  Tabs (Metformin Hcl) .... One Tablet By Mouth Daily For Blood Sugar 4)  Lisinopril 10 Mg Tabs (Lisinopril) .Marland Kitchen.. 1 Tablet By Mouth Daily For Blood Pressure/kidneys 5)  Atenolol 50 Mg Tabs (Atenolol) .... 1/2 Tablet By Mouth Two Times A Day 6)  Triamcinolone Acetonide 0.1 %  Crea (Triamcinolone Acetonide) .... Apply To Affected Area As Needed Two Times A Day 7)  Glucometer Elite Classic  Kit (Blood Glucose Monitoring Suppl) .... Check Blood Sugar Daily Before Breakfast Dx 250.00 Dispense Glucometer, Lancets and Test Strips 8)  Lac-Hydrin 12 % Lotn (Ammonium Lactate) .... Apply To Dry Skin Daily As Needed 9)  Loratadine 10 Mg Tabs (Loratadine) .... One Tablet By Mouth Daily For Allergies  Allergies (verified): 1)  ! Codeine  Review of Systems General:  Denies fever. CV:  Denies chest pain or discomfort. Resp:  Denies cough. GI:  Denies abdominal pain, nausea, and vomiting. MS:  Denies joint pain.  Physical Exam  General:  alert.   Head:  normocephalic.   Lungs:  normal breath sounds.   Heart:  normal rate and regular rhythm.   holosystolic murmur Abdomen:  normal bowel sounds.   Msk:  normal ROM.   Neurologic:  alert & oriented X3.   Skin:  multiple nevi to face and back Psych:  Oriented X3.    Diabetes Management Exam:    Foot Exam (with socks and/or shoes not present):       Sensory-Monofilament:          Left foot: normal          Right foot: normal       Nails:          Left foot: normal          Right foot: normal   Impression & Recommendations:  Problem # 1:  DIABETES MELLITUS, TYPE II (ICD-250.00) Will check hgba1c today pt will need strips for her glucometer Her updated  medication list for this problem includes:    Aspirin Ec Low Strength 81 Mg Tbec (Aspirin) ..... On by mouth once daily    Glucophage 500 Mg Tabs (Metformin hcl) ..... One tablet by mouth daily for blood sugar    Lisinopril 10 Mg Tabs (Lisinopril) .Marland Kitchen... 1 tablet by mouth daily for blood pressure/kidneys  Orders: T-TSH ([id number]) T-CBC w/Diff ([id number]) T-Comprehensive Metabolic Panel [id number]) T-Lipid Profile [id number]) T- Hemoglobin A1C (44010-27253) Capillary Blood Glucose/CBG (66440)  Problem # 2:  HYPERTENSION (ICD-401.9)  BP stable Continue  DASH diet Her updated medication list for this problem includes:    Norvasc 10 Mg Tabs (Amlodipine besylate) .Marland Kitchen... 1 tablet by mouth daily for blood pressure    Lisinopril 10 Mg Tabs (Lisinopril) .Marland Kitchen... 1 tablet by mouth daily for blood pressure/kidneys    Atenolol 50 Mg Tabs (Atenolol) .Marland Kitchen... 1/2 tablet by mouth two times a day  Orders: Rapid HIV  ([address])  Problem # 3:  HYPERLIPIDEMIA (ICD-272.4) will check labs today Orders: T-Lipid Profile ([id number])  Complete Medication List: 1)  Norvasc 10 Mg Tabs (Amlodipine besylate) .Marland Kitchen.. 1 tablet by mouth daily for blood pressure 2)  Aspirin Ec Low Strength 81 Mg Tbec (Aspirin) .... On by mouth once daily 3)  Glucophage 500 Mg Tabs (Metformin hcl) .... One tablet by mouth daily for blood sugar 4)  Lisinopril 10 Mg Tabs (Lisinopril) .Marland Kitchen.. 1 tablet by mouth daily for blood pressure/kidneys 5)  Atenolol 50 Mg Tabs (Atenolol) .... 1/2 tablet by mouth two times a day 6)  Triamcinolone Acetonide 0.1 % Crea (Triamcinolone acetonide) .... Apply to affected area as needed two times a day 7)  Glucometer Elite Classic Kit (Blood glucose monitoring suppl) .... Check blood sugar daily before breakfast dx 250.00 dispense glucometer, lancets and test strips 8)  Lac-hydrin 12 % Lotn (Ammonium lactate) .... Apply to dry skin daily as needed 9)  Loratadine 10 Mg Tabs (Loratadine) .... One tablet by  mouth daily for allergies 10)  Blood Glucose Test Strp (Glucose blood) .... Use to check blood sugar daily before breakfast  Other Orders: Mammogram (Screening) (Mammo)  Diabetes Management Assessment/Plan:      The following lipid goals have been established for the patient: Total cholesterol goal of 200; LDL cholesterol goal of 100; HDL cholesterol goal of 40; Triglyceride goal of 150.  Her blood pressure goal is < 130/80.    Hypertension Assessment/Plan:      The patient's hypertensive risk group is category C: Target organ damage and/or diabetes.  Her calculated 10 year risk of coronary heart disease is 9 %.  Today's blood pressure is 140/80.  Her blood pressure goal is < 130/80.  Lipid Assessment/Plan:      Based on NCEP/ATP III, the patient's risk factor category is "history of diabetes".  The patient's lipid goals are as follows: Total cholesterol goal is 200; LDL cholesterol goal is 100; HDL cholesterol goal is 40; Triglyceride goal is 150.  Her LDL cholesterol goal has not been met.    Patient Instructions: 1)  Diabetes - your hgba1c will be checked today.  You will be notified of the results.  This is not available today in house. 2)  Keep your appointment for a mammogram 3)  Follow up in 2 months for a nurse visit - Flu vaccine 4)  Follow up in March 2011 for diabetes and high blood pressure.  Diabetic Foot Exam Foot Inspection Is there a history of a foot ulcer?              No Is there a foot ulcer now?              No Can the patient see the bottom of their feet?          Yes Are the shoes appropriate in style and fit?          Yes Is there swelling or an abnormal foot shape?  No Are the toenails long?                No Are the toenails thick?                No Are the toenails ingrown?              No Is there heavy callous build-up?              No Is there pain in the calf muscle (Intermittent claudication) when walking?    NoIs there a claw toe deformity?               No Is there elevated skin temperature?            No Is there limited ankle dorsiflexion?            No Is there foot or ankle muscle weakness?            No  Diabetic Foot Care Education Patient educated on appropriate care of diabetic feet.  Pulse Check          Right Foot          Left Foot Dorsalis Pedis:        normal            normal    10-g (5.07) Semmes-Weinstein Monofilament Test Performed by: Michelle Nasuti          Right Foot          Left Foot Visual Inspection               Prescriptions: LAC-HYDRIN 12 % LOTN (AMMONIUM LACTATE) apply to dry skin daily as needed  #241ml x 1   Entered and Authorized by:   Lehman Prom FNP   Signed by:   Lehman Prom FNP on 08/05/2010   Method used:   Faxed to ...       [facility] (retail)       [address] [place], [place]  [address]       Ph: [phone]       Fax: [phone]   RxID:   [id number] BLOOD GLUCOSE TEST  STRP (GLUCOSE BLOOD) Use to check blood sugar daily before breakfast  #100 x 5   Entered and Authorized by:   Lehman Prom FNP   Signed by:   Lehman Prom FNP on 08/05/2010   Method used:   Faxed to ...       [facility] (retail)       [address] [place], [place]  [address]       Ph: [phone]       Fax: [phone]   RxID:   [id number]    Last LDL:                                                 84 (11/13/2009 9:18:00 PM)        Diabetic Foot Exam Diabetic Foot Care Education :Patient educated on appropriate care of diabetic feet.  Pulse Check          Right Foot          Left Foot Dorsalis Pedis:        normal  normal    10-g (5.07) Semmes-Weinstein Monofilament Test Performed by: Michelle Nasuti          Right Foot          Left Foot Visual Inspection               Test Control      normal         normal Site 1         normal         normal Site 2          normal         normal Site 3         normal         normal Site 4         normal         normal Site 5         normal         normal Site 6         normal         normal Site 7         normal         normal Site 8         normal         normal Site 9         normal         normal Site 10         normal         normal  Impression      normal         normal   Prevention & Chronic Care Immunizations   Influenza vaccine: Fluvax 3+  (12/05/2008)    Tetanus booster: 12/01/2003: historical per pt    Pneumococcal vaccine: Pneumovax  (10/05/2008)  Colorectal Screening   Hemoccult: Done  (11/30/2003)   Hemoccult action/deferral: Ordered  (01/28/2010)   Hemoccult due: 01/29/2011    Colonoscopy: Not documented   Colonoscopy action/deferral: patient defers today but will consider in the future  (12/05/2008)  Other Screening   Pap smear:  Specimen Adequacy: Satisfactory for evaluation.   Interpretation/Result:Negative for intraepithelial Lesion or Malignancy.     (01/28/2010)   Pap smear action/deferral: PAP smear done  (12/05/2008)   Pap smear due: 01/2011    Mammogram: ASSESSMENT: Negative - BI-RADS 1^MM DIGITAL SCREENING  (07/17/2009)   Mammogram action/deferral: mammogram ordered  (12/05/2008)   Mammogram due: 07/17/2010   Smoking status: never  (08/05/2010)  Diabetes Mellitus   HgbA1C: 6.1  (11/13/2009)   HgbA1C action/deferral: Ordered  (01/28/2010)   Hemoglobin A1C due: 11/04/2010    Eye exam: normal  (01/13/2010)    Foot exam: yes  (08/05/2010)   Foot exam action/deferral: Do today   High risk foot: Not documented   Foot care education: Done  (08/05/2010)   Foot exam due: 04/30/2010    Urine microalbumin/creatinine ratio: 18.0  (10/04/2007)   Urine microalbumin action/deferral: Ordered  Lipids   Total Cholesterol: 163  (11/13/2009)   LDL: 84  (11/13/2009)   LDL Direct: 119.6  (10/04/2007)   HDL: 64  (11/13/2009)   Triglycerides: 74  (11/13/2009)    SGOT  (AST): 21  (11/13/2009)   SGPT (ALT): 13  (11/13/2009) CMP ordered    Alkaline phosphatase: 61  (11/13/2009)   Total bilirubin: 1.3  (11/13/2009)  Hypertension   Last Blood Pressure: 140 / 80  (08/05/2010)   Serum creatinine: 0.39  (  11/13/2009)   Serum potassium 3.7  (11/13/2009) CMP ordered   Self-Management Support :   Personal Goals (by the next clinic visit) :     Personal A1C goal: 7  (08/05/2010)     Personal blood pressure goal: 140/90  (08/05/2010)     Personal LDL goal: 70  (08/05/2010)    Patient will work on the following items until the next clinic visit to reach self-care goals:     Medications and monitoring: take my medicines every day, check my blood sugar, check my blood pressure  (08/05/2010)     Eating: eat foods that are low in salt  (08/05/2010)    Diabetes self-management support: Not documented    Hypertension self-management support: Not documented    Lipid self-management support: Not documented    Laboratory Results   Blood Tests     CBG Random:: 113mg /dL  Date/Time Received: August 05, 2010 9:48 AM   Other Tests  Rapid HIV: negative   Appended Document: HTN/Diabetes     Allergies: 1)  ! Codeine   Complete Medication List: 1)  Norvasc 10 Mg Tabs (Amlodipine besylate) .Marland Kitchen.. 1 tablet by mouth daily for blood pressure 2)  Aspirin Ec Low Strength 81 Mg Tbec (Aspirin) .... On by mouth once daily 3)  Glucophage 500 Mg Tabs (Metformin hcl) .... One tablet by mouth daily for blood sugar 4)  Lisinopril 10 Mg Tabs (Lisinopril) .Marland Kitchen.. 1 tablet by mouth daily for blood pressure/kidneys 5)  Atenolol 50 Mg Tabs (Atenolol) .... 1/2 tablet by mouth two times a day 6)  Triamcinolone Acetonide 0.1 % Crea (Triamcinolone acetonide) .... Apply to affected area as needed two times a day 7)  Glucometer Elite Classic Kit (Blood glucose monitoring suppl) .... Check blood sugar daily before breakfast dx 250.00 dispense glucometer, lancets and test  strips 8)  Lac-hydrin 12 % Lotn (Ammonium lactate) .... Apply to dry skin daily as needed 9)  Loratadine 10 Mg Tabs (Loratadine) .... One tablet by mouth daily for allergies 10)  Blood Glucose Test Strp (Glucose blood) .... Use to check blood sugar daily before breakfast   Laboratory Results  Date/Time Received: August 05, 2010 9:55 AM   Other Tests  Rapid HIV: negative

## 2010-12-30 NOTE — Progress Notes (Signed)
Summary: DROPPED OFF CPP FORM  Phone Note Call from Patient Call back at [facility] Phone [phone]   Summary of Call: Sharon Munoz DROPPED OFF HER WORK CPP FORM TO BE FILLED OUT. ITS ON THE SHELVE W/YOUR REFILLS Initial call taken by: Leodis Rains,  October 27, 2010 1:00 PM  Follow-up for Phone Call        form completed  notify pt to pick up make copy for EMR Follow-up by: Lehman Prom FNP,  October 28, 2010 4:09 PM  Additional Follow-up for Phone Call Additional follow up Details #1::        CALLED PT LEFT MESSAGE/FORM READY FOR PICK UP /IN DRAWER FOR PT PICK UP Additional Follow-up by: Arta Bruce,  October 29, 2010 10:02 AM

## 2010-12-30 NOTE — Progress Notes (Signed)
Summary: Not feeling well  Medications Added LORATADINE 10 MG TABS (LORATADINE) One tablet by mouth daily for allergies       Phone Note Call from Patient Call back at [facility] Phone [phone] Call back at [phone]   Summary of Call: The pt don't have voice since saturday and she is under a lot of sore throat and headache.  The early appointment available is on May 6 but she would like to know if the provider can call in some antibiotic. ( [facility] [address]) Daphine Deutscher FNP  Initial call taken by: Manon Hilding,  March 27, 2010 4:15 PM  Follow-up for Phone Call        spoke with pt she states that she got wet on Friday and got sick on Saturday and lost her voice and could not talk sunday she says she started to feel real bad on Monday with cough and headache.  She has had no fever her throat is real scratchy.  She says she has not been around anyone that is sick.  I asked her had she been running her air conditioner and she said yes.  Additional Follow-up for Phone Call Additional follow up Details #1::        pt can call on next week to see if there are earlier appts available advise her to gargle with warm salt water AND  drink tea.  will send loratadine 10mg by mouth to [facility] [address]. will be $4 so pt can start taking as this will help some with congestion and throat irritation Additional Follow-up by: Dmari Schubring Martin FNP,  March 27, 2010 5:22 PM    Additional Follow-up for Phone Call Additional follow up Details #2::    pt was scheduled for and appt today but called and cancelled.  pt states that she is feeling much better.pt said that she would call when she needs to be seen. Follow-up by: Brenda Craddock,  Apr 04, 2010 11:17 AM  New/Updated Medications: LORATADINE 10 MG TABS (LORATADINE) One tablet by mouth daily for allergies Prescriptions: LORATADINE 10 MG TABS (LORATADINE) One tablet by mouth daily for allergies  #30 x 3   Entered and Authorized by:    Adylin Hankey Martin FNP   Signed by:   Achille Xiang Martin FNP on 03/27/2010   Method used:   Electronically to        [facility] [address] [id number] (retail)       [address]       [place], [place]  [address]       Ph: [phone]       Fax: [phone]   RxID:   [id number]

## 2010-12-30 NOTE — Letter (Signed)
Summary: *HSN Results Follow up  [facility]  [address] [place], [place] [address]   Phone: [phone]  Fax: [phone]      01/30/2010   JENEAN ESCANDON [address] [place], [place]  [address]   Dear  Ms. [facility] Tomasetti,                            ____S.Drinkard,FNP   ____D. Gore,FNP       ____B. McPherson,MD   ____V. Rankins,MD    ____E. Mulberry,MD    _X___N. Daphine Deutscher, FNP  ____D. Reche Dixon, MD    ____K. Philipp Deputy, MD    ____Other     This letter is to inform you that your recent test(s):  ___X____Pap Smear    _______Lab Test     _______X-ray    ____X___ is within acceptable limits  _______ requires a medication change  _______ requires a follow-up lab visit  _______ requires a follow-up visit with your provider   Comments: Pap Smear results are normal.       _________________________________________________________ If you have any questions, please contact our office [phone].                    Sincerely,    Lehman Prom FNP [facility]

## 2010-12-30 NOTE — Progress Notes (Signed)
Summary: Office Visit//DEPRESSION SCREENING  Office Visit//DEPRESSION SCREENING   Imported By: Arta Bruce 03/26/2010 15:53:06  _____________________________________________________________________  External Attachment:    Type:   Image     Comment:   External Document

## 2010-12-30 NOTE — Progress Notes (Signed)
Summary: Lab results  Phone Note Outgoing Call   Summary of Call: most recent lab results are ok so her earlier abnormal labs will be something that we will follow.   other labs being ok confirm that there is not a problem with the production of bilirubin she just likely has at times too much in her system. nothing to do about this.  will monitor Initial call taken by: Lehman Prom FNP,  August 27, 2010 2:11 PM  Follow-up for Phone Call        pt informed of above information. Follow-up by: Levon Hedger,  August 27, 2010 3:46 PM

## 2011-01-01 NOTE — Progress Notes (Signed)
Summary: refill on norvasc  Phone Note Call from Patient Call back at Home Phone [phone]   Reason for Call: Refill Medication Summary of Call: MARTIN PT. MS Ho[place]and NEEDS [place] TO CALL [facility] ON [address] FOR HER NORVASC BECAUSE SHE CANNOT USE [facility] UNTIL SHE GET HER CARD RENEWED. Initial call taken by: Leodis Rains,  December 04, 2010 12:55 PM  Follow-up for Phone Call        Refill completed.  Dutch Quint RN  December 04, 2010 4:46 PM     Prescriptions: NORVASC 10 MG  TABS (AMLODIPINE BESYLATE) 1 tablet by mouth daily for blood pressure  #30 x 3   Entered by:   Dutch Quint RN   Authorized by:   Lehman Prom FNP   Signed by:   Dutch Quint RN on 12/04/2010   Method used:   Electronically to        [facility] (retail)       [address]       [place], [place]  [address]       Ph: [phone]       Fax: [phone]   RxID:   [id number]

## 2011-01-22 ENCOUNTER — Telehealth (INDEPENDENT_AMBULATORY_CARE_PROVIDER_SITE_OTHER): Payer: Self-pay | Admitting: Nurse Practitioner

## 2011-01-26 ENCOUNTER — Telehealth (INDEPENDENT_AMBULATORY_CARE_PROVIDER_SITE_OTHER): Payer: Self-pay | Admitting: Nurse Practitioner

## 2011-02-05 NOTE — Progress Notes (Signed)
Summary: LEFT SIDE PAIN   Phone Note Call from Patient Call back at [phone]    Caller: Patient Summary of Call: PT IS BEEN HAVING DISCOMFORT IN [facility] LEFT SIDE Wynelle Link CALL HER  [phone]  OR [phone] THANK YOU  Initial call taken by: Cheryll Dessert,  January 22, 2011 2:54 PM  Follow-up for Phone Call        Cold symptoms started last Wednesday, sore throat, went away after gargling,   Has been drinking hot tea and ate a whole lemon by itself, left side pain started the next morning.  Pain came and went during the day, thought it might have been gas.  Had pain similar a while back. Was on left side only, nonradiating.  Took pepcid and pain resolved, had been there most of day.    Now has cough, productive white or light color mucus, is better than it was.  Cough had been keeping her up at night, better now. Has not been taking loratadine, not aware of available Rx. Denies fever, SOB, headache, nasal drainage or congestion.  Voice is hoarse -- went to church yesterday, is going to meetings.  Advised per cold protocol, to take Coricidan HBP for cough, mucinex for congestion. Can call pharmacy to refill loratadine to use for symptoms if desired  to try before Coricidan and mucinex.  Humidify home, drink plenty of water, not just tea, to take OTC ibuprofen or tylenol for discomfort.  Advised to remain at home until she feels better, limit exposure to large crowds.  Verbalized understanding and agreement.     Appt. for six-month f/u scheduled 02/12/11.  Dutch Quint RN  January 26, 2011 11:31 AM

## 2011-02-05 NOTE — Progress Notes (Signed)
Summary: Med refills needed  Phone Note Call from Patient Call back at [phone]   Summary of Call: pt called pharm and no refills were at pharmacy. Was tols fax would be sent over.requesting refill on meds for allergies [facility] Initial call taken by: Michelle Nasuti,  January 26, 2011 11:59 AM  Follow-up for Phone Call        Rx was originally filled at [facility] -- Erick Alley notified to transfer and fill Rx for pt. per her request.  Dutch Quint RN  January 26, 2011 3:32 PM

## 2011-02-24 ENCOUNTER — Encounter (INDEPENDENT_AMBULATORY_CARE_PROVIDER_SITE_OTHER): Payer: Self-pay | Admitting: Nurse Practitioner

## 2011-02-24 ENCOUNTER — Encounter: Payer: Self-pay | Admitting: Nurse Practitioner

## 2011-02-24 LAB — CONVERTED CEMR LAB
Nitrite: NEGATIVE
Protein, U semiquant: NEGATIVE
Urobilinogen, UA: 0.2
WBC Urine, dipstick: NEGATIVE
pH: 7

## 2011-03-03 NOTE — Assessment & Plan Note (Signed)
Summary: Diabetes   Vital Signs:  Patient profile:   55 year old female Menstrual status:  perimenopausal Weight:      143.3 pounds BMI:     26.30 Temp:     98.0 degrees F oral Pulse rate:   65 / minute Pulse rhythm:   regular Resp:     16 per minute BP sitting:   127 / 86  (left arm) Cuff size:   regular  Vitals Entered By: Levon Hedger (February 24, 2011 9:19 AM)  Nutrition Counseling: Patient's BMI is greater than 25 and therefore counseled on weight management options. CC: follow-up visit DM, Hypertension Management, Lipid Management, Abdominal Pain Is Patient Diabetic? Yes Pain Assessment Patient in pain? no      CBG Result 99  Does patient need assistance? Functional Status Self care Ambulation Normal   CC:  follow-up visit DM, Hypertension Management, Lipid Management, and Abdominal Pain.  History of Present Illness:  Pt into the office for diabetes f/u  Right ear - pt was cleaning ears out about 3-4 days ago with a q-tip and the end of the q-tip dislodged in her ear.  She is requesting provider to look in her ears today   Diabetes Management History:      The patient is a 55 years old female who comes in for evaluation of Type 2 Diabetes Mellitus.  She has not been enrolled in the "Diabetic Education Program".  She states understanding of dietary principles and is following her diet appropriately.  No sensory loss is reported.  Self foot exams are being performed.  She is checking home blood sugars.  She says that she is exercising.  Type of exercise includes: bike.  Duration of exercise is estimated to be 30-60  She is doing this 3 times per week.        Hypoglycemic symptoms are not occurring.  No hyperglycemic symptoms are reported.        No changes have been made to her treatment plan since last visit.    Dyspepsia History:      She has no alarm features of dyspepsia including no history of melena, hematochezia, dysphagia, persistent vomiting, or  involuntary weight loss > 5%.  There is a prior history of GERD.  The patient does not have a prior history of documented ulcer disease.  The dominant symptom is heartburn or acid reflux.  An H-2 blocker medication is not currently being taken.    Hypertension History:      She denies headache, chest pain, and palpitations.  She notes no problems with any antihypertensive medication side effects.        Positive major cardiovascular risk factors include diabetes, hyperlipidemia, and hypertension.  Negative major cardiovascular risk factors include female age less than 30 years old and non-tobacco-user status.        Further assessment for target organ damage reveals no history of ASHD, cardiac end-organ damage (CHF/LVH), stroke/TIA, peripheral vascular disease, renal insufficiency, or hypertensive retinopathy.    Lipid Management History:      Positive NCEP/ATP III risk factors include diabetes and hypertension.  Negative NCEP/ATP III risk factors include female age less than 79 years old, no history of early menopause without estrogen hormone replacement, HDL cholesterol greater than 60, non-tobacco-user status, no ASHD (atherosclerotic heart disease), no prior stroke/TIA, no peripheral vascular disease, and no history of aortic aneurysm.        The patient states that she knows about the "Therapeutic Lifestyle Change" diet.  Her compliance with the TLC diet is fair.  She expresses no side effects from her lipid-lowering medication.  The patient denies any symptoms to suggest myopathy or liver disease.       Habits & Providers  Alcohol-Tobacco-Diet     Alcohol drinks/day: 0     Tobacco Status: never  Exercise-Depression-Behavior     Does Patient Exercise: yes     Exercise Counseling: not indicated; exercise is adequate     Type of exercise: bike     Exercise (avg: min/session): 30-60     Times/week: 3     Depression Counseling: not indicated; screening negative for depression     Drug  Use: never  Allergies (verified): 1)  ! Codeine  Review of Systems ENT:  Complains of earache; ? tip of q-tip in her right ear. GI:  Denies indigestion; Bout with indigestion about 4 weeks ago.  She admits that she had been drinking lots of coffee and peanuts.  She decreased consumption of these foods and symptoms decreased.  Physical Exam  General:  alert.   Head:  normocephalic.   Ears:  right ear - TM intact, no evidence of Q-tip Left ear - TM intact, no inflammation Mouth:  fair dentition.   Lungs:  normal breath sounds.   Heart:  normal rate and regular rhythm.    Diabetes Management Exam:    Foot Exam (with socks and/or shoes not present):       Sensory-Monofilament:          Left foot: normal          Right foot: normal       Nails:          Left foot: normal          Right foot: normal   Impression & Recommendations:  Problem # 1:  DIABETES MELLITUS, TYPE II (ICD-250.00)  Her updated medication list for this problem includes:    Aspirin Ec Low Strength 81 Mg Tbec (Aspirin) ..... On by mouth once daily    Glucophage 500 Mg Tabs (Metformin hcl) ..... One tablet by mouth two times a day for blood sugar **note change in dose**    Lisinopril 10 Mg Tabs (Lisinopril) .Marland Kitchen... 1 tablet by mouth daily for blood pressure/kidneys  Orders: Capillary Blood Glucose/CBG (82948) Hemoglobin A1C (83036)  Problem # 2:  HYPERTENSION (ICD-401.9) Bp is doing well. DASH diet. Her updated medication list for this problem includes:    Norvasc 10 Mg Tabs (Amlodipine besylate) .Marland Kitchen... 1 tablet by mouth daily for blood pressure    Lisinopril 10 Mg Tabs (Lisinopril) .Marland Kitchen... 1 tablet by mouth daily for blood pressure/kidneys    Atenolol 50 Mg Tabs (Atenolol) .Marland Kitchen... 1/2 tablet by mouth two times a day  Orders: UA Dipstick w/o Micro (manual) (44010)  Problem # 3:  GERD (ICD-530.81) stable advised pt of foods to avoid  Problem # 4:  HYPERLIPIDEMIA (ICD-272.4) stable  Complete Medication  List: 1)  Norvasc 10 Mg Tabs (Amlodipine besylate) .Marland Kitchen.. 1 tablet by mouth daily for blood pressure 2)  Aspirin Ec Low Strength 81 Mg Tbec (Aspirin) .... On by mouth once daily 3)  Glucophage 500 Mg Tabs (Metformin hcl) .... One tablet by mouth two times a day for blood sugar **note change in dose** 4)  Lisinopril 10 Mg Tabs (Lisinopril) .Marland Kitchen.. 1 tablet by mouth daily for blood pressure/kidneys 5)  Atenolol 50 Mg Tabs (Atenolol) .... 1/2 tablet by mouth two times a day 6)  Triamcinolone Acetonide  0.1 % Crea (Triamcinolone acetonide) .... Apply to affected area as needed two times a day 7)  Glucometer Elite Classic Kit (Blood glucose monitoring suppl) .... Check blood sugar daily before breakfast dx 250.00 dispense glucometer, lancets and test strips 8)  Lac-hydrin 12 % Lotn (Ammonium lactate) .... Apply to dry skin daily as needed 9)  Loratadine 10 Mg Tabs (Loratadine) .... One tablet by mouth daily for allergies 10)  Blood Glucose Test Strp (Glucose blood) .... Use to check blood sugar daily before breakfast  Diabetes Management Assessment/Plan:      The following lipid goals have been established for the patient: Total cholesterol goal of 200; LDL cholesterol goal of 100; HDL cholesterol goal of 40; Triglyceride goal of 150.  Her blood pressure goal is < 130/80.    Hypertension Assessment/Plan:      The patient's hypertensive risk group is category C: Target organ damage and/or diabetes.  Her calculated 10 year risk of coronary heart disease is 7 %.  Today's blood pressure is 127/86.  Her blood pressure goal is < 130/80.  Lipid Assessment/Plan:      Based on NCEP/ATP III, the patient's risk factor category is "history of diabetes".  The patient's lipid goals are as follows: Total cholesterol goal is 200; LDL cholesterol goal is 100; HDL cholesterol goal is 40; Triglyceride goal is 150.  Her LDL cholesterol goal has not been met.    Diabetic Foot Exam Foot Inspection Is there a history of a foot  ulcer?              No Is there a foot ulcer now?              No Can the patient see the bottom of their feet?          Yes Are the shoes appropriate in style and fit?          Yes Is there swelling or an abnormal foot shape?          No Are the toenails long?                No Are the toenails thick?                No Are the toenails ingrown?              No Is there heavy callous build-up?              No Is there pain in the calf muscle (Intermittent claudication) when walking?    NoIs there a claw toe deformity?              No Is there elevated skin temperature?            No Is there limited ankle dorsiflexion?            No Is there foot or ankle muscle weakness?            No  Diabetic Foot Care Education Patient educated on appropriate care of diabetic feet.  Pulse Check          Right Foot          Left Foot Dorsalis Pedis:        normal            normal  High Risk Feet? No   10-g (5.07) Semmes-Weinstein Monofilament Test Performed by: Levon Hedger          Right Foot  Left Foot Visual Inspection                Patient Instructions: 1)  You have refills on all your medications.  I have sent Glucophage to the pharmacy for you. 2)  Blood pressure - doing well.  Keep up the good work 3)  Diabetes - Hgba1c = 6.2 4)  Blood sugar is doing well.  Keep taking your medications. 5)  Schedule your next appointment for a complete physical exam. You need to come fasting for labs. Prescriptions: TRIAMCINOLONE ACETONIDE 0.1 %  CREA (TRIAMCINOLONE ACETONIDE) apply to affected area as needed two times a day  #30gm x 1   Entered and Authorized by:   Lehman Prom FNP   Signed by:   Lehman Prom FNP on 02/24/2011   Method used:   Faxed to ...       [facility] (retail)       [address] [place], [place]  [address]       Ph: [phone]       Fax: [phone]   RxID:   [id number] LAC-HYDRIN 12 % LOTN (AMMONIUM  LACTATE) apply to dry skin daily as needed  #263ml x 1   Entered and Authorized by:   Lehman Prom FNP   Signed by:   Lehman Prom FNP on 02/24/2011   Method used:   Faxed to ...       [facility] - [facility] (retail)       [address] [place], [place]  [address]       Ph: [phone]       Fax: [phone]   RxID:   [id number] GLUCOPHAGE 500 MG  TABS (METFORMIN HCL) One tablet by mouth two times a day for blood sugar **note change in dose**  #60 x 11   Entered and Authorized by:   Lehman Prom FNP   Signed by:   Lehman Prom FNP on 02/24/2011   Method used:   Faxed to ...       [facility] - [facility] (retail)       [address] [place], [place]  [address]       Ph: [phone]       Fax: [phone]   RxID:   [id number]    Orders Added: 1)  Capillary Blood Glucose/CBG [82948] 2)  Est. Patient Level III [67619] 3)  UA Dipstick w/o Micro (manual) [81002] 4)  Hemoglobin A1C [83036]     Last LDL:                                                 80 (08/05/2010 10:09:00 PM)        Diabetic Foot Exam Diabetic Foot Care Education :Patient educated on appropriate care of diabetic feet.  Pulse Check          Right Foot          Left Foot Dorsalis Pedis:        normal            normal  High Risk Feet? No   10-g (5.07) Semmes-Weinstein Monofilament Test Performed by: Levon Hedger          Right Foot  Left Foot Visual Inspection               Test Control      normal         normal Site 1         normal         normal Site 2         normal         normal Site 3         normal         normal Site 4         normal         normal Site 5         normal         normal Site 6         normal         normal Site 7         normal         normal Site 8         normal         normal Site 9         normal         normal Site 10         normal         normal  Impression       normal         normal   Laboratory Results   Urine Tests  Date/Time Received: February 24, 2011 9:36 AM   Routine Urinalysis   Color: lt. yellow Glucose: negative   (Normal Range: Negative) Bilirubin: negative   (Normal Range: Negative) Ketone: negative   (Normal Range: Negative) Spec. Gravity: 1.010   (Normal Range: 1.003-1.035) Blood: negative   (Normal Range: Negative) pH: 7.0   (Normal Range: 5.0-8.0) Protein: negative   (Normal Range: Negative) Urobilinogen: 0.2   (Normal Range: 0-1) Nitrite: negative   (Normal Range: Negative) Leukocyte Esterace: negative   (Normal Range: Negative)     Blood Tests   Date/Time Received: February 24, 2011 9:56 AM   HGBA1C: 6.2%   (Normal Range: Non-Diabetic - 3-6%   Control Diabetic - 6-8%) CBG Random:: 99mg /dL

## 2011-04-14 NOTE — Consult Note (Signed)
Sharon Munoz, Sharon Munoz              ACCOUNT NO.:  [id number]   MEDICAL RECORD NO.:  [id number]          PATIENT TYPE:  INP   LOCATION:  4743                         FACILITY:  [facility]   PHYSICIAN:  Pramod P. Pearlean Brownie, MD    DATE OF BIRTH:  September 24, 1956   DATE OF CONSULTATION:  04/03/2008  DATE OF DISCHARGE:                                 CONSULTATION   REFERRING PHYSICIAN:  Madaline Savage, M.D.   REASON FOR REFERRAL:  Speech disturbance and blurred vision.   HISTORY OF PRESENT ILLNESS:  Sharon Munoz is a 55 year old African  American lady who developed sudden onset of chest discomfort, some  speech difficulties, and blurred vision yesterday afternoon.  This  occurred in the setting of elevated blood pressure of 170/100.  When she  has seen at [facility], she however, was also found to be  tachycardiac with a heart rate of 136 per minute.  She was admitted for  evaluation for cardiac ischemia.  Cardiac workup has essentially been  negative.  She has even undergone coronary angiogram, which has shown no  significant obstructive coronary disease.  She states she has had a  similar episode about 5 years ago, at that time, she was found to have  low potassium.  She denies any other focal symptoms suggestive of stroke  or TIA.   PAST MEDICAL HISTORY:  Significant for:  1. Diabetes.  2. Hypertension.  3. Hyperlipidemia.   HOME MEDICATIONS:  1. Glucophage.  2. Lisinopril.  3. Norvasc.  4. Potassium.   MEDICATION/ALLERGIES:  CODEINE.   SOCIAL HISTORY:  The patient is single.  She does not smoke or drink.  She works part-time.   REVIEW OF SYSTEMS:  Positive for chest pain, blurred vision, and slurred  speech.  No fever, diarrhea, or shortness of breath.   PHYSICAL EXAM:  GENERAL:  Reveals a pleasant African American middle-  aged lady who is at present not in distress.  VITAL SIGNS:  She is afebrile today with a pulse rate of 82 per minute  and regular, respiratory rate  16 per minute.  Afebrile.  HEAD:  Nontraumatic.  NECK:  Supple without bruit.  ENT:  Unremarkable.  CARDIAC:  Regular heart sounds.  LUNGS:  Clear to auscultation.  ABDOMEN:  Soft and nontender.  NEUROLOGICAL EXAM:  She is pleasant, awake, alert and cooperative.  There is no aphasia, apraxia, or dysarthria.  Pupils equal and reactive.  Eye movements are full range.  Face is asymmetric.  Palatal movements  are normal.  Tongue is midline.  MOTOR SYSTEM:  No upper extremity drift, symmetric strength tone and  reflexes, coordination and sensation.  Her gait was not tested.   DATA REVIEWED:  An MRI scan of the brain done today is normal without  any acute infarct.  MRI of the brain shows no large vessel stenosis of  the anterior circulation.  There is a congenitally small posterior  circulation.   IMPRESSION:  A 55 year old patient with transient episode of blurred  vision and some speech difficulties in the setting of acute chest pain.  I  doubt this represents a transient ischemic attack or stroke.  The  history is also not suggestive of her typical migraine either.   PLAN:  I would recommend antiplatelet agents, aspirin, as well as  aggressive treatment of diabetes, hypertension and hyperlipidemia.  Check carotid ultrasound, fasting lipid profile, hemoglobin A1c and  homocysteine.  I would be happy to follow up the patient on consult.  Kindly call for further questions.           ______________________________  Sunny Schlein. Pearlean Brownie, MD     PPS/MEDQ  D:  04/03/2008  T:  04/04/2008  Job:  [id number]

## 2011-04-14 NOTE — Assessment & Plan Note (Signed)
[facility]                                 ON-CALL NOTE   Sharon Munoz, Sharon Munoz                       MRN:          [id number]  DATE:01/19/2008                            DOB:          05-03-1956    Phone number [phone].  Age 55.  Date of call is January 19, 2008.   Patient of Dr. Artist Pais   SUBJECTIVE:  New onset chest pain. The patient is a 55 year old  diabetic with high blood pressure who began having chest pain earlier  this evening.  She reports some gas and bloating.  She denies any past  heart disease history.   ASSESSMENT/PLAN:  Given chest pain and risk factors, recommended  evaluation in the emergency room.  If she is unable to have someone take  her to the emergency room, it was recommended for her to call EMS.     Kerby Nora, MD  Electronically Signed    AB/MedQ  DD: 01/19/2008  DT: 01/20/2008  Job #: [id number]

## 2011-04-17 NOTE — Discharge Summary (Signed)
NAMECAYLYNN, MINCHEW NO.:  [id number]   MEDICAL RECORD NO.:  [id number]          PATIENT TYPE:  INP   LOCATION:  4743                         FACILITY:  [facility]   PHYSICIAN:  Nicki Guadalajara, M.D.     DATE OF BIRTH:  09-25-1956   DATE OF ADMISSION:  04/02/2008  DATE OF DISCHARGE:  04/05/2008                               DISCHARGE SUMMARY   Ms. Strada is a 55 year old African-American female with prior history  of hypertension and dyslipidemia.  She came to the emergency room with  complaints of chest pain, slurred speech, and elevated blood pressure.  She experienced some fleeting anterior substernal chest pain about 4  a.m.  She apparently had been feeling ill the previous day.  She went to  [facility].  Her blood pressure was 170/100, heart rate was 136.  She had  continued mild chest fullness.  She was given some sublingual  nitroglycerin and transferred to [facility].  Her EKG showed sinus  tachycardia, questionable old anterior infarct.  IV nitroglycerin was  started.  Initial point-of-care markers showed an elevated troponin.  Thus, she was admitted, put on IV heparin, and plans were made for her  to undergo cardiac catheterization.  She underwent cath on Apr 03, 2008  by Dr. Chanda Busing.  Her EF was 60%.  She had normal coronary  arteries.  It showed normal renal arteries also.  Stroke consult was  called because of her episode of blurred vision for 30 minutes and MRI  was ordered.  The MRI revealed no acute intracranial abnormalities.  She  had minimal right maxillary ethmoid sinus disease, thought to be  chronic.  She had no significant proximal stenosis, aneurysmal branch or  fascial occlusion.  It was felt that she may need a TEE to evaluate if  she has a PFO and some carotid Dopplers.  She was seen by Dr. Pearlean Brownie who  recommended aggressive treatment of her diabetes, hypertension, and  hyperlipidemia.  Thus on Apr 05, 2008, she was seen by Dr. Tresa Endo, felt  stable to be discharge to home, and she will need an outpatient TEE.   LABORATORY DATA:  Hemoglobin 12.6, hematocrit 38, platelets 239, and  WBCs 7.5.  Sodium on admission was 142, potassium 3.9.  On May 5, it was  141/2.9 and on repeat it was 141/3.7.  Her BUN was 3, creatinine 0.48.  Her magnesium was 1.7, hemoglobin A1c was 6.4.  CK-MB number one was  151/2.8, troponin of 0.31, number two 185/5.9, troponin of 1.10, and  number three 196/4.8, troponin of 0.68.  Total cholesterol was 140,  triglycerides 22, HDL 55, LDL was 81.  TSH was 1.911.  Homocystine 4.3.  Carotid Dopplers revealed mild soft plaques throughout bilaterally.  Vertebral artery flow was antegrade bilaterally.  No significant right  ICA stenosis and no significant left ICA stenosis.   DISCHARGE MEDICATIONS:  1. Glucophage.  She can restart on Thursday afternoon.  2. Lisinopril 10 mg a day.  3. Potassium chloride 10 mEq a day.  4. Amlodipine 10 mg a day.  5. Aspirin 81 mg a  day.  6. Atenolol 25 mg 3 times per day.  7. Magnesium oxide 400 mg twice per day x3 days and then daily.  8. Lopressor 50 mg twice per day.   DISCHARGE INSTRUCTIONS:  She should have a blood drawn on Apr 10, 2008.  She will follow up with Dr. Jacinto Halim.  Our office will call with an  appointment for her TEE.   DISCHARGE DIAGNOSES:  1. Elevated cardiac enzymes, specifically troponins, unknown source.  2. Questionable transient ischemic attack.  3. History of headaches.  4. Status post cardiac catheterization with normal coronary arteries      and normal left ventricular function of 60% and normal renal      arteries.  5. Normal MRI/MRA of brain.  6. Normal carotid Dopplers.  7. Hypertension.  8. Non-insulin-dependent diabetes mellitus.  9. Electrolyte abnormality, replaced in the hospital.      Lezlie Octave, N.P.    ______________________________  Nicki Guadalajara, M.D.    BB/MEDQ  D:  05/07/2008  T:  05/08/2008  Job:  [id number]   cc:    Pramod P. Pearlean Brownie, MD

## 2011-04-17 NOTE — Assessment & Plan Note (Signed)
[facility]                                 ON-CALL NOTE   LOVENIA, DEBRULER                       MRN:          [id number]  DATE:04/05/2007                            DOB:          May 30, 1956    Time received 9:24 p.m. 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   The patient reports that she has intermittent over the past 3 days of  her right temporal feeling jumping and numb.  She has had no other  symptoms whatsoever over the past week, specifically no other neurologic  deficits, no fever, no headaches, etc.  All of these sensations appear  limited to a very specific spot on her right temporal.  She has had a  history of frequent headaches recently, although not over the past week.  She has had a CT scan of her head done as well as laboratories.  She has  not heard a report back on them yet, but she is due to meet a  neurologist on Thursday of this week for a evaluation and to go over all  these results.  My response is to reassure her since her symptoms are so  mild and limited to such a small area, I think she would be fine to wait  until tomorrow.  Certainly she should see the neurologist on Thursday as  scheduled.  I did ask her to contact Dr. Artist Pais tomorrow to let him know  what was going on.  If her symptoms change or worsen in any other way  tonight she can call me back or go to the emergency room.     Tera Mater. Clent Ridges, MD  Electronically Signed    SAF/MedQ  DD: 04/05/2007  DT: 04/06/2007  Job #: [id number]

## 2011-08-21 LAB — POCT I-STAT CREATININE
Creatinine, Ser: 0.5
Operator id: 161631

## 2011-08-21 LAB — I-STAT 8, (EC8 V) (CONVERTED LAB)
Chloride: 106
Glucose, Bld: 139 — ABNORMAL HIGH
HCT: 43
Potassium: 3 — ABNORMAL LOW
Sodium: 141
pH, Ven: 7.36 — ABNORMAL HIGH

## 2011-08-21 LAB — POCT CARDIAC MARKERS: Troponin i, poc: <0.05

## 2011-08-26 LAB — CBC
HCT: 36.9
HCT: 38
Hemoglobin: 12.4
Hemoglobin: 12.6
MCV: 88.5
RBC: 4.18
RBC: 4.29
RDW: 13.6
WBC: 7.5
WBC: 8.1

## 2011-08-26 LAB — COMPREHENSIVE METABOLIC PANEL
AST: 29
Albumin: 3.7
Alkaline Phosphatase: 49
Chloride: 106
Creatinine, Ser: 0.48
Potassium: 2.9 — ABNORMAL LOW
Total Bilirubin: 2.2 — ABNORMAL HIGH

## 2011-08-26 LAB — TSH: TSH: 1.911

## 2011-08-26 LAB — CK TOTAL AND CKMB (NOT AT ARMC)
CK, MB: 4.8 — ABNORMAL HIGH
CK, MB: 5.9 — ABNORMAL HIGH
Relative Index: 1.9
Total CK: 185 — ABNORMAL HIGH

## 2011-08-26 LAB — HEMOGLOBIN A1C
Hgb A1c MFr Bld: 6.4 — ABNORMAL HIGH
Hgb A1c MFr Bld: 6.4 — ABNORMAL HIGH
Mean Plasma Glucose: 151
Mean Plasma Glucose: 151

## 2011-08-26 LAB — B-NATRIURETIC PEPTIDE (CONVERTED LAB): Pro B Natriuretic peptide (BNP): 40

## 2011-08-26 LAB — POCT CARDIAC MARKERS
Operator id: 277751
Troponin i, poc: 0.2 — ABNORMAL HIGH

## 2011-08-26 LAB — PROTIME-INR: Prothrombin Time: 14.3

## 2011-08-26 LAB — BASIC METABOLIC PANEL
CO2: 23
Chloride: 109
Creatinine, Ser: 0.38 — ABNORMAL LOW
GFR calc Af Amer: >60

## 2011-08-26 LAB — POCT I-STAT, CHEM 8
Calcium, Ion: 1.21
Chloride: 104
HCT: 42
Hemoglobin: 14.3

## 2011-08-26 LAB — TROPONIN I: Troponin I: 0.31 — ABNORMAL HIGH

## 2011-08-26 LAB — LIPID PANEL
HDL: 55
LDL Cholesterol: 67
Total CHOL/HDL Ratio: 2.5
Triglycerides: 22
VLDL: 14
VLDL: 4

## 2011-08-26 LAB — HEPARIN LEVEL (UNFRACTIONATED): Heparin Unfractionated: <0.1 — ABNORMAL LOW

## 2011-09-01 ENCOUNTER — Other Ambulatory Visit (HOSPITAL_COMMUNITY): Payer: Self-pay | Admitting: Family Medicine

## 2011-09-01 DIAGNOSIS — Z1231 Encounter for screening mammogram for malignant neoplasm of breast: Secondary | ICD-10-CM

## 2011-09-11 ENCOUNTER — Ambulatory Visit (HOSPITAL_COMMUNITY)
Admission: RE | Admit: 2011-09-11 | Discharge: 2011-09-11 | Disposition: A | Discharge status: Home / Self Care | Payer: Self-pay | Source: Ambulatory Visit | Attending: Family Medicine | Admitting: Family Medicine

## 2011-09-11 DIAGNOSIS — Z1231 Encounter for screening mammogram for malignant neoplasm of breast: Secondary | ICD-10-CM | POA: Insufficient documentation

## 2011-10-21 ENCOUNTER — Other Ambulatory Visit: Payer: Self-pay

## 2011-10-21 ENCOUNTER — Emergency Department (INDEPENDENT_AMBULATORY_CARE_PROVIDER_SITE_OTHER)
Admission: EM | Admit: 2011-10-21 | Discharge: 2011-10-21 | Disposition: A | Discharge status: Home / Self Care | Payer: Self-pay | Source: Home / Self Care | Attending: Family Medicine | Admitting: Family Medicine

## 2011-10-21 DIAGNOSIS — R51 Headache: Secondary | ICD-10-CM

## 2011-10-21 DIAGNOSIS — M542 Cervicalgia: Secondary | ICD-10-CM

## 2011-10-21 DIAGNOSIS — M79609 Pain in unspecified limb: Secondary | ICD-10-CM

## 2011-10-21 DIAGNOSIS — IMO0001 Reserved for inherently not codable concepts without codable children: Secondary | ICD-10-CM

## 2011-10-21 HISTORY — DX: Essential (primary) hypertension: I10

## 2011-10-21 MED ORDER — IBUPROFEN 800 MG PO TABS
800.0000 mg | ORAL_TABLET | Freq: Once | ORAL | Status: AC
  Administered 2011-10-21: 800 mg via ORAL

## 2011-10-21 MED ORDER — IBUPROFEN 800 MG PO TABS
ORAL_TABLET | ORAL | Status: AC
  Filled 2011-10-21: qty 1
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
## 2015-11-07 ENCOUNTER — Encounter (HOSPITAL_COMMUNITY): Payer: Self-pay | Admitting: Emergency Medicine

## 2015-11-07 ENCOUNTER — Emergency Department (HOSPITAL_COMMUNITY)
Admission: EM | Admit: 2015-11-07 | Discharge: 2015-11-07 | Disposition: A | Discharge status: Home / Self Care | Payer: Self-pay | Attending: Emergency Medicine | Admitting: Emergency Medicine

## 2015-11-07 DIAGNOSIS — E119 Type 2 diabetes mellitus without complications: Secondary | ICD-10-CM | POA: Insufficient documentation

## 2015-11-07 DIAGNOSIS — L299 Pruritus, unspecified: Secondary | ICD-10-CM | POA: Insufficient documentation

## 2015-11-07 DIAGNOSIS — I10 Essential (primary) hypertension: Secondary | ICD-10-CM | POA: Insufficient documentation

## 2015-11-07 DIAGNOSIS — Z79899 Other long term (current) drug therapy: Secondary | ICD-10-CM | POA: Insufficient documentation

## 2015-11-07 MED ORDER — DIPHENHYDRAMINE HCL 25 MG PO CAPS
25.0000 mg | ORAL_CAPSULE | Freq: Once | ORAL | Status: AC
  Administered 2015-11-07: 25 mg via ORAL
  Filled 2015-11-07: qty 1

## 2015-11-07 MED ORDER — DEXAMETHASONE 4 MG PO TABS
12.0000 mg | ORAL_TABLET | Freq: Once | ORAL | Status: AC
  Administered 2015-11-07: 12 mg via ORAL
  Filled 2015-11-07: qty 3

## 2015-11-07 NOTE — ED Provider Notes (Signed)
CSN: [id number]     Arrival date & time 11/07/15  1150 History   First MD Initiated Contact with Patient 11/07/15 1517     Chief Complaint  Patient presents with  . Facial Swelling     (Consider location/radiation/quality/duration/timing/severity/associated sxs/prior Treatment) HPI   59 year old female with intermittent swelling and itching. Ongoing for several weeks now. 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 She reports being evaluated by her PCP. She was on lisinopril and had stopped. She reports finishing a course of prednisone. Her symptoms improved while on prednisone but then began shortly after discontinuing them. Intermittent diffuse itching, worse around her eyes. Mild swelling periorbitally. Denies any rash. No cough. No wheezing. No nausea, vomiting, diarrhea or abdominal pain. No contacts with similar symptoms. No exposures that she is aware of.  Past Medical History  Diagnosis Date  . Diabetes mellitus   . Hypertension    History reviewed. No pertinent past surgical history. No family history on file. Social History  Substance Use Topics  . Smoking status: Never Smoker   . Smokeless tobacco: None  . Alcohol Use: No   OB History    No data available     Review of Systems  All systems reviewed and negative, other than as noted in HPI.   Allergies  Ace inhibitors and Codeine  Home Medications   Prior to Admission medications   Medication Sig Start Date End Date Taking? Authorizing Provider  amLODipine (NORVASC) 10 MG tablet Take 10 mg by mouth daily.      Historical Provider, MD  atenolol (TENORMIN) 25 MG tablet Take 25 mg by mouth 2 (two) times daily.      Historical Provider, MD  lisinopril (PRINIVIL,ZESTRIL) 10 MG tablet Take 10 mg by mouth daily.      Historical Provider, MD  metFORMIN (GLUCOPHAGE) 500 MG tablet Take 500 mg by mouth 2 (two) times daily with a meal.      Historical Provider, MD   BP 135/77 mmHg  Pulse 84   Temp(Src) 98.3 F (36.8 C) (Oral)  Resp 20  Ht  (1.651 m)  Wt 143 lb (64.864 kg)  BMI 23.80 kg/m2  SpO2 95% Physical Exam  Constitutional: She appears well-developed and well-nourished. No distress.  HENT:  Head: Normocephalic and atraumatic.  May be some mild facial puffiness. No overt edema. Posterior pharynx is clear. Handling secretions. Normal sounding voice. Neck is supple. No stridor.  Eyes: Conjunctivae are normal. Right eye exhibits no discharge. Left eye exhibits no discharge. No scleral icterus.  Neck: Neck supple.  Cardiovascular: Normal rate, regular rhythm and normal heart sounds.  Exam reveals no gallop and no friction rub.   No murmur heard. Pulmonary/Chest: Effort normal and breath sounds normal. No respiratory distress.  Normal work of breathing. Speaks in complete sentences.  Abdominal: Soft. She exhibits no distension. There is no tenderness.  Musculoskeletal: She exhibits no edema or tenderness.  No lower extremity edema  Neurological: She is alert.  Skin: Skin is warm and dry. Rash noted.  No rash appreciated.  Psychiatric: She has a normal mood and affect. Her behavior is normal. Thought content normal.  Nursing note and vitals reviewed.   ED Course  Procedures (including critical care time) Labs Review Labs Reviewed - No data to display  Imaging Review No results found. I have personally reviewed and evaluated these images and lab results as part of my medical decision-making.   EKG Interpretation None  MDM   Final diagnoses:  Itching    59 year old female with continued itching and swelling. Recently stopped lisinopril. I'm not convinced that this is medication induced. No icterus. Get the sense that this is more a mild generalized reaction. This may potentially be related to reaction to something in her new work environment. Of note, she is also on Norvasc which can cause peripheral edema. Her exam is pretty unremarkable though. She  has no respiratory complaints. No GI symptoms. She is hemodynamically stable. No further changes to her home medications at this time. We'll give her dose of Decadron. As needed Benadryl. Outpatient follow-up with her PCP.  Raeford RazorStephen Derisha Funderburke, MD 11/09/15 [id number]

## 2015-11-07 NOTE — ED Notes (Signed)
Pt states she seen her PCP 2 weeks ago after having swelling in her lips and face. Pt states her PCP stopped her lisinopril 2 weeks ago. Pt states seen she has still had off and on swelling. Around 3am pt states swelling in her lips and right eye returned. Pt denies any shortness of breath of trouble swallowing. No swelling in tongue. Breathing is unlabored. Pt is warm and dry.

## 2015-11-07 NOTE — ED Notes (Signed)
Pt reports swelling in hands, lip, and around her eyes for the past few weeks, despite stopping lisinopril over 2 weeks as advised by her doctor. Pt reports lower lip swelling that she woke up with this am, denies sob or feeling of swelling in her mouth or throat, reports itching on face and bilateral hands.

## 2015-11-07 NOTE — Discharge Instructions (Signed)
Pruritus °Pruritus is an itching feeling. There are many different conditions and factors that can make your skin itchy. Dry skin is one of the most common causes of itching. Most cases of itching do not require medical attention. Itchy skin can turn into a rash.  °HOME CARE INSTRUCTIONS  °Watch your pruritus for any changes. Take these steps to help with your condition:  °Skin Care °· Moisturize your skin as needed. A moisturizer that contains petroleum jelly is best for keeping moisture in your skin. °· Take or apply medicines only as directed by your health care provider. This may include: °¨ Corticosteroid cream. °¨ Anti-itch lotions. °¨ Oral anti-histamines. °· Apply cool compresses to the affected areas. °· Try taking a bath with: °¨ Epsom salts. Follow the instructions on the packaging. You can get these at your local pharmacy or grocery store. °¨ Baking soda. Pour a small amount into the bath as directed by your health care provider. °¨ Colloidal oatmeal. Follow the instructions on the packaging. You can get this at your local pharmacy or grocery store. °· Try applying baking soda paste to your skin. Stir water into baking soda until it reaches a paste-like consistency.   °· Do not scratch your skin. °· Avoid hot showers or baths, which can make itching worse. A cold shower may help with itching as long as you use a moisturizer after. °· Avoid scented soaps, detergents, and perfumes. Use gentle soaps, detergents, perfumes, and other cosmetic products. °General Instructions °· Avoid wearing tight clothes. °· Keep a journal to help track what causes your itch. Write down: °¨ What you eat. °¨ What cosmetic products you use. °¨ What you drink. °¨ What you wear. This includes jewelry. °· Use a humidifier. This keeps the air moist, which helps to prevent dry skin. °SEEK MEDICAL CARE IF: °· The itching does not go away after several days. °· You sweat at night. °· You have weight loss. °· You are unusually  thirsty. °· You urinate more than normal. °· You are more tired than normal. °· You have abdominal pain. °· Your skin tingles. °· You feel weak. °· Your skin or the whites of your eyes look yellow (jaundice). °· Your skin feels numb. °  °This information is not intended to replace advice given to you by your health care provider. Make sure you discuss any questions you have with your health care provider. °  °Document Released: 07/29/2011 Document Revised: 04/02/2015 Document Reviewed: 11/12/2014 °Elsevier Interactive Patient Education ©2016 [employer] ° °

## 2015-11-26 ENCOUNTER — Ambulatory Visit: Payer: Self-pay | Admitting: Allergy and Immunology

## 2015-12-09 ENCOUNTER — Ambulatory Visit: Payer: Self-pay | Admitting: Pediatrics

## 2015-12-09 ENCOUNTER — Ambulatory Visit: Payer: Self-pay | Admitting: Allergy and Immunology

## 2015-12-10 ENCOUNTER — Ambulatory Visit: Payer: Self-pay | Admitting: Allergy and Immunology

## 2015-12-10 ENCOUNTER — Encounter (INDEPENDENT_AMBULATORY_CARE_PROVIDER_SITE_OTHER): Payer: Self-pay

## 2015-12-10 ENCOUNTER — Ambulatory Visit (INDEPENDENT_AMBULATORY_CARE_PROVIDER_SITE_OTHER): Payer: BLUE CROSS/BLUE SHIELD | Admitting: Allergy and Immunology

## 2015-12-10 ENCOUNTER — Encounter: Payer: Self-pay | Admitting: Allergy and Immunology

## 2015-12-10 VITALS — BP 160/90 | HR 88 | Temp 98.3°F | Resp 16 | Ht 62.99 in | Wt 142.0 lb

## 2015-12-10 DIAGNOSIS — L500 Allergic urticaria: Secondary | ICD-10-CM

## 2015-12-10 DIAGNOSIS — T783XXD Angioneurotic edema, subsequent encounter: Secondary | ICD-10-CM

## 2015-12-10 DIAGNOSIS — J310 Chronic rhinitis: Secondary | ICD-10-CM | POA: Insufficient documentation

## 2015-12-10 DIAGNOSIS — T783XXA Angioneurotic edema, initial encounter: Secondary | ICD-10-CM | POA: Insufficient documentation

## 2015-12-10 DIAGNOSIS — K2970 Gastritis, unspecified, without bleeding: Secondary | ICD-10-CM

## 2015-12-10 MED ORDER — FLUTICASONE PROPIONATE 50 MCG/ACT NA SUSP: NASAL | Status: DC

## 2015-12-10 MED ORDER — LEVOCETIRIZINE DIHYDROCHLORIDE 5 MG PO TABS: ORAL_TABLET | ORAL | Status: DC

## 2015-12-10 NOTE — Assessment & Plan Note (Signed)
Angioedema occurs in up to 50% of chronic urticaria cases.  She was appropriately taken off of ACE inhibitor  Months ago, however there has been no change in symptoms.  Treatment plan as outlined above.

## 2015-12-10 NOTE — Patient Instructions (Addendum)
Chronic urticaria Chronic urticaria. There is no obvious etiology identified. Skin tests to select food allergens were negative today. NSAIDs and emotional stress commonly exacerbate urticaria but are not the underlying etiology in this case. Physical urticarias are negative by history (i.e. pressure-induced, temperature, vibration, solar, etc.). History and lesions are not consistent with urticaria pigmentosa so I am not suspicious for mastocytosis. There are no concomitant symptoms concerning for anaphylaxis or constitutional symptoms worrisome for an underlying malignancy. We will rule out other potential etiologies with labs. For symptom relief, patient is to take oral antihistamines as directed.  The following labs have been ordered: FCeRI antibody, TSH, anti-thyroglobulin antibody, thyroid peroxidase antibody, tryptase, C4, urea breath test, CBC, CMP, ESR, ANA, and galactose-alpha-1,3-galactose IgE level.  The patient will be called with further recommendations after lab results have returned.  Instructions have been provided and discussed for H1/H2 receptor blockade with titration to find lowest effective dose.  A prescription has been provided for levocetirizine, 5 mg daily as needed.  To jumpstart symptom relief, prednisone has been provided, 20 mg x 4 days, 10 mg x1 day, then stop. The patient has been asked to carefully monitor blood glucose levels while on prednisone.  She has verbalized understanding and has agreed to do so.  A journal is to be kept recording any foods eaten, beverages consumed, medications taken within a 6 hour period prior to the onset of symptoms, as well as record activities being performed, and environmental conditions. For any symptoms concerning for anaphylaxis, 911 is to be called immediately.  Angioedema Angioedema occurs in up to 50% of chronic urticaria cases.  She was appropriately taken off of ACE inhibitor  Months ago, however there has been no change in  symptoms.  Treatment plan as outlined above.  Chronic rhinitis Non-allergic rhinitis.  All seasonal and perennial aeroallergen skin tests are negative despite a positive histamine control.  Intranasal steroids and intranasal antihistamines are effective for symptoms associated with non-allergic rhinitis, whereas second generation antihistamines such as cetirizine, loratadine and fexofenadine have been found to be ineffective for this condition.  A prescription has been provided for fluticasone nasal spray, one spray per nostril 1-2 times daily as needed. Proper nasal spray technique has been discussed and demonstrated.    Return in about 4 weeks (around 01/07/2016).  Urticaria (Hives)  . Levocetirizine (Xyzal) 5 mg in morning and Cetirizine (Zyrtec) 33m at night and ranitidine (Zantac) 150 mg twice a day. If no symptoms for 7-14 days then decrease to. . Levocetirizine (Xyzal) 5 mg in morning and Cetirizine (Zyrtec) 119mat night and ranitidine (Zantac) 150 mg once a day.  If no symptoms for 7-14 days then decrease to. . Levocetirizine (Xyzal) 5 mg in morning and Cetirizine (Zyrtec) 1053mt night.  If no symptoms for 7-14 days then decrease to. . Levocetirizine (Xyzal) 5 mg once a day.  May use Benadryl (diphenhydramine) as needed for breakthrough symptoms       If symptoms return, then step up dosage

## 2015-12-10 NOTE — Progress Notes (Signed)
New Patient Note  RE: Sharon Munoz MRN: [id number] DOB: 11/07/1956 Date of Office Visit: 12/10/2015  Referring provider: Foye Spurling, MD Primary care provider: Foye Spurling, MD  Chief Complaint: Pruritis; Urticaria; and Angioedema   History of present illness: HPI Comments: Sharon Munoz is a 60 y.o. female who presents today for her initial consultation of hives. Over the past 2 months, Sharon Munoz has experienced recurrent episodes of hives. Typical distribution includes her entire body.  The lesions are described as erythematous, raised, and pruritic.  Individual hives last less than 24 hours without leaving residual pigmentation or bruising.  On a few occasions she has experienced associated eyelid, lip, and/or tongue angioedema.  However, she denies concomitant cardiopulmonary or other GI symptoms.  She had been on lisinopril which was discontinued 2 months ago without perceived benefit.  She has not experienced unexpected weight loss, recurrent fevers or drenching night sweats. No specific medication, food or environmental triggers have been identified. The symptoms do not seem to correlate with NSAIDs or emotional stress.  She does not recall having signs or symptoms of infection at the time of symptom onset. Sharon Munoz has tried to control symptoms with diphenhydramine with minimal temporary relief.  She has been evaluated and treated in the emergency department for these symptoms. Skin biopsy has not been performed.  Sharon Munoz experiences frequent nasal congestion. No significant seasonal symptom variation has been noted nor have specific environmental triggers been identified.    Assessment and plan: Chronic urticaria Chronic urticaria. There is no obvious etiology identified. Skin tests to select food allergens were negative today. NSAIDs and emotional stress commonly exacerbate urticaria but are not the underlying etiology in this case. Physical urticarias are negative by  history (i.e. pressure-induced, temperature, vibration, solar, etc.). History and lesions are not consistent with urticaria pigmentosa so I am not suspicious for mastocytosis. There are no concomitant symptoms concerning for anaphylaxis or constitutional symptoms worrisome for an underlying malignancy. We will rule out other potential etiologies with labs. For symptom relief, patient is to take oral antihistamines as directed.  The following labs have been ordered: FCeRI antibody, TSH, anti-thyroglobulin antibody, thyroid peroxidase antibody, tryptase, C4, urea breath test, CBC, CMP, ESR, ANA, and galactose-alpha-1,3-galactose IgE level.  The patient will be called with further recommendations after lab results have returned.  Instructions have been provided and discussed for H1/H2 receptor blockade with titration to find lowest effective dose.  A prescription has been provided for levocetirizine, 5 mg daily as needed.  To jumpstart symptom relief, prednisone has been provided, 20 mg x 4 days, 10 mg x1 day, then stop. The patient has been asked to carefully monitor blood glucose levels while on prednisone.  She has verbalized understanding and has agreed to do so.  A journal is to be kept recording any foods eaten, beverages consumed, medications taken within a 6 hour period prior to the onset of symptoms, as well as record activities being performed, and environmental conditions. For any symptoms concerning for anaphylaxis, 911 is to be called immediately.  Angioedema Angioedema occurs in up to 50% of chronic urticaria cases.  She was appropriately taken off of ACE inhibitor  Months ago, however there has been no change in symptoms.  Treatment plan as outlined above.  Chronic rhinitis Non-allergic rhinitis.  All seasonal and perennial aeroallergen skin tests are negative despite a positive histamine control.  Intranasal steroids and intranasal antihistamines are effective for symptoms associated  with non-allergic rhinitis, whereas second generation antihistamines such  as cetirizine, loratadine and fexofenadine have been found to be ineffective for this condition.  A prescription has been provided for fluticasone nasal spray, one spray per nostril 1-2 times daily as needed. Proper nasal spray technique has been discussed and demonstrated.    Meds ordered this encounter  Medications  . levocetirizine (XYZAL) 5 MG tablet    Sig: TAKE ONE TABLET ONCE DAILY AS DIRECTED    Dispense:  30 tablet    Refill:  5  . fluticasone (FLONASE) 50 MCG/ACT nasal spray    Sig: USE ONE SPRAY IN EACH NOSTRIL ONCE OR TWICE DAILY AS NEEDED FOR STUFFY NOSE OR DRAINAGE    Dispense:  16 g    Refill:  5    Diagnositics: Environmental skin testing: Negative despite a positive histamine control. Food allergen skin testing: Negative despite a positive histamine control.    Physical examination: Blood pressure 160/90, pulse 88, temperature 98.3 F (36.8 C), temperature source Oral, resp. rate 16, height 5' 2.99" (1.6 m), weight 141 lb 15.6 oz (64.4 kg).  General: Alert, interactive, in no acute distress. HEENT: TMs pearly gray, turbinates moderately edematous without discharge, post-pharynx mildly erythematous. Neck: Supple without lymphadenopathy. Lungs: Clear to auscultation without wheezing, rhonchi or rales. CV: Normal S1, S2 without murmurs. Abdomen: Nondistended, nontender. Skin: Scattered erythematous urticarial type lesions primarily located on the upper right arm and lower back , nonvesicular. Extremities:  No clubbing, cyanosis or edema. Neuro:   Grossly intact.  Review of systems: Review of Systems  Constitutional: Negative for fever, chills and weight loss.  HENT: Positive for congestion. Negative for nosebleeds.   Eyes: Negative for blurred vision.  Respiratory: Negative for cough, hemoptysis, shortness of breath and wheezing.   Cardiovascular: Negative for chest pain.    Gastrointestinal: Negative for diarrhea and constipation.  Genitourinary: Negative for dysuria.  Musculoskeletal: Negative for myalgias and joint pain.  Skin: Positive for itching and rash.  Neurological: Negative for dizziness.  Endo/Heme/Allergies: Does not bruise/bleed easily.    Past medical history: Past Medical History  Diagnosis Date  . Diabetes mellitus   . Hypertension     Past surgical history: History reviewed. No pertinent past surgical history.  Family history: Family History  Problem Relation Age of Onset  . Hypertension Mother   . Asthma Father   . Hypertension Father     Social history: Social History   Social History  . Marital Status: Legally Separated    Spouse Name: N/A  . Number of Children: N/A  . Years of Education: N/A   Occupational History  . Not on file.   Social History Main Topics  . Smoking status: Never Smoker   . Smokeless tobacco: Not on file  . Alcohol Use: No  . Drug Use: Not on file  . Sexual Activity: Not on file   Other Topics Concern  . Not on file   Social History Narrative   Environmental History:  Sharon Munoz lives in a 71-year-old house with carpeting in the bedroom and central air/heat.  There is a dog in the house which does not have access to her bedroom.  She is a nonsmoker.    Medication List       This list is accurate as of: 12/10/15  7:51 PM.  Always use your most recent med list.               amLODipine 10 MG tablet  Commonly known as:  NORVASC  Take 10 mg by mouth daily.  atenolol 50 MG tablet  Commonly known as:  TENORMIN  Take 50 mg by mouth 2 (two) times daily.     fluticasone 50 MCG/ACT nasal spray  Commonly known as:  FLONASE  USE ONE SPRAY IN EACH NOSTRIL ONCE OR TWICE DAILY AS NEEDED FOR STUFFY NOSE OR DRAINAGE     glimepiride 4 MG tablet  Commonly known as:  AMARYL  Take 4 mg by mouth daily with breakfast.     levocetirizine 5 MG tablet  Commonly known as:  XYZAL  TAKE ONE  TABLET ONCE DAILY AS DIRECTED     lisinopril 10 MG tablet  Commonly known as:  PRINIVIL,ZESTRIL  Take 10 mg by mouth daily.     metFORMIN 750 MG 24 hr tablet  Commonly known as:  GLUCOPHAGE-XR  Take 750 mg by mouth 2 (two) times daily.        Known medication allergies: Allergies  Allergen Reactions  . Ace Inhibitors Swelling  . Codeine     I appreciate the opportunity to take part in this Sharon Munoz's care. Please do not hesitate to contact me with questions.  Sincerely,   R. Edgar Frisk, MD

## 2015-12-10 NOTE — Assessment & Plan Note (Addendum)
Chronic urticaria. There is no obvious etiology identified. Skin tests to select food allergens were negative today. NSAIDs and emotional stress commonly exacerbate urticaria but are not the underlying etiology in this case. Physical urticarias are negative by history (i.e. pressure-induced, temperature, vibration, solar, etc.). History and lesions are not consistent with urticaria pigmentosa so I am not suspicious for mastocytosis. There are no concomitant symptoms concerning for anaphylaxis or constitutional symptoms worrisome for an underlying malignancy. We will rule out other potential etiologies with labs. For symptom relief, patient is to take oral antihistamines as directed.  The following labs have been ordered: FCeRI antibody, TSH, anti-thyroglobulin antibody, thyroid peroxidase antibody, tryptase, C4, urea breath test, CBC, CMP, ESR, ANA, and galactose-alpha-1,3-galactose IgE level.  The patient will be called with further recommendations after lab results have returned.  Instructions have been provided and discussed for H1/H2 receptor blockade with titration to find lowest effective dose.  A prescription has been provided for levocetirizine, 5 mg daily as needed.  To jumpstart symptom relief, prednisone has been provided, 20 mg x 4 days, 10 mg x1 day, then stop. The patient has been asked to carefully monitor blood glucose levels while on prednisone.  She has verbalized understanding and has agreed to do so.  A journal is to be kept recording any foods eaten, beverages consumed, medications taken within a 6 hour period prior to the onset of symptoms, as well as record activities being performed, and environmental conditions. For any symptoms concerning for anaphylaxis, 911 is to be called immediately.

## 2015-12-10 NOTE — Assessment & Plan Note (Addendum)
Non-allergic rhinitis.  All seasonal and perennial aeroallergen skin tests are negative despite a positive histamine control.  Intranasal steroids and intranasal antihistamines are effective for symptoms associated with non-allergic rhinitis, whereas second generation antihistamines such as cetirizine, loratadine and fexofenadine have been found to be ineffective for this condition.  A prescription has been provided for fluticasone nasal spray, one spray per nostril 1-2 times daily as needed. Proper nasal spray technique has been discussed and demonstrated. 

## 2015-12-11 LAB — C4 COMPLEMENT: Complement C4, Serum: 43 mg/dL (ref 14–44)

## 2015-12-12 LAB — H. PYLORI BREATH TEST: H. pylori UBiT: POSITIVE — AB

## 2015-12-16 ENCOUNTER — Ambulatory Visit: Payer: Self-pay | Admitting: Allergy and Immunology

## 2015-12-17 ENCOUNTER — Telehealth: Payer: Self-pay

## 2015-12-17 NOTE — Telephone Encounter (Signed)
Final lab results have not been returned yet.

## 2015-12-17 NOTE — Telephone Encounter (Signed)
Patient is still sick, she feels like she is getting worse with her hives. I made her for an apt for tomorrow to see Dr. Nunzio Cobbs, but she would still like to talk to a nurse. Also she is wanting to know if her lab results have come back .

## 2015-12-17 NOTE — Telephone Encounter (Signed)
Checked [facility] site several labs still pending, advised Dr Nunzio Cobbs and LM for patient to call to advised same

## 2015-12-17 NOTE — Telephone Encounter (Signed)
Patient advised having large hives on stomach yesterday and even hives on soles of feet yesterday.  I did advise her to continue meds Dr Rod Can had instructed to use and can add Benadryl every 4 hours prn.  I did advise her that her labs had some pending but Dr Nunzio Cobbs would discuss her H Pylori positive test and treatment at her appt tomorrow

## 2015-12-18 ENCOUNTER — Ambulatory Visit (INDEPENDENT_AMBULATORY_CARE_PROVIDER_SITE_OTHER): Payer: BLUE CROSS/BLUE SHIELD | Admitting: Allergy and Immunology

## 2015-12-18 ENCOUNTER — Encounter: Payer: Self-pay | Admitting: Allergy and Immunology

## 2015-12-18 VITALS — BP 128/70 | HR 80 | Temp 98.3°F | Resp 20

## 2015-12-18 DIAGNOSIS — L500 Allergic urticaria: Secondary | ICD-10-CM | POA: Diagnosis not present

## 2015-12-18 DIAGNOSIS — E0800 Diabetes mellitus due to underlying condition with hyperosmolarity without nonketotic hyperglycemic-hyperosmolar coma (NKHHC): Secondary | ICD-10-CM

## 2015-12-18 DIAGNOSIS — J310 Chronic rhinitis: Secondary | ICD-10-CM | POA: Diagnosis not present

## 2015-12-18 MED ORDER — RANITIDINE HCL 150 MG PO TABS: 150.0000 mg | ORAL_TABLET | Freq: Two times a day (BID) | ORAL | Status: DC

## 2015-12-18 NOTE — Assessment & Plan Note (Signed)
   Sharon Munoz has been asked to carefully monitor blood glucose while taking prednisone.

## 2015-12-18 NOTE — Telephone Encounter (Signed)
Noted  

## 2015-12-18 NOTE — Assessment & Plan Note (Addendum)
   Prednisone has been provided: 10 mg daily 5 days.  H1/H2 receptor blockade have been verbalized and provided in written form with instructions for titrating to the lowest effective dose.  Continue to keep a detailed symptom exposure journal.  When the remaining lab results have returned, Sharon Munoz will be called with further recommendations and follow up instructions.

## 2015-12-18 NOTE — Assessment & Plan Note (Signed)
   Continue fluticasone nasal spray, one spray per nostril 1-2 times daily as needed. 

## 2015-12-18 NOTE — Patient Instructions (Addendum)
Chronic urticaria  Prednisone has been provided: 10 mg daily 5 days.  H1/H2 receptor blockade have been verbalized and provided in written form with instructions for titrating to the lowest effective dose.  Continue to keep a detailed symptom exposure journal.  When the remaining lab results have returned, Sharon Munoz will be called with further recommendations and follow up instructions.  Diabetes mellitus (HCC)  [name] has been asked to carefully monitor blood glucose while taking prednisone.  Chronic rhinitis  Continue fluticasone nasal spray, one spray per nostril 1-2 times daily as needed.    Urticaria (Hives)  . Levocetirizine (Xyzal) 5 mg in morning and Cetirizine (Zyrtec)  at night and ranitidine (Zantac) 150 mg twice a day. If no symptoms for 7-14 days then decrease to. . Levocetirizine (Xyzal) 5 mg in morning and Cetirizine (Zyrtec)  at night and ranitidine (Zantac) 150 mg once a day.  If no symptoms for 7-14 days then decrease to. . Levocetirizine (Xyzal) 5 mg in morning and Cetirizine (Zyrtec)  at night.  If no symptoms for 7-14 days then decrease to. . Levocetirizine (Xyzal) 5 mg once a day.  May use Benadryl (diphenhydramine) as needed for breakthrough symptoms       If symptoms return, then step up dosage Return in about 6 weeks (around 01/29/2016), or if symptoms worsen or fail to improve.

## 2015-12-18 NOTE — Progress Notes (Signed)
Follow-up Note  RE: SECILIA APPS MRN: [id number] DOB: 10/17/1956 Date of Office Visit: 12/18/2015  Primary care provider: Laurena Slimmer, MD Referring provider: Laurena Slimmer, MD  History of present illness: HPI Comments: Sharon Munoz is a 60 y.o. female with urticaria with associated angioedema as well as non-allergic rhinitis who presents today for a sick visit. She reports that her hives are "out of control." She has been taking levocetirizine 5 mg daily in the morning but has not been taking cetirizine in the evening or ranitidine 150 mg twice daily as recommended when she was initially seen in this clinic last week. She has not experienced angioedema over the past week or concomitant cardiopulmonary or GI symptoms. She has no nasal symptom complaints today.    Assessment and plan: Chronic urticaria  Prednisone has been provided: 10 mg daily 5 days.  H1/H2 receptor blockade have been verbalized and provided in written form with instructions for titrating to the lowest effective dose.  Continue to keep a detailed symptom exposure journal.  When the remaining lab results have returned, Zinnia will be called with further recommendations and follow up instructions.  Diabetes mellitus (HCC)  Chantrice has been asked to carefully monitor blood glucose while taking prednisone.  Chronic rhinitis  Continue fluticasone nasal spray, one spray per nostril 1-2 times daily as needed.     Meds ordered this encounter  Medications  . ranitidine (ZANTAC) 150 MG tablet    Sig: Take 1 tablet (150 mg total) by mouth 2 (two) times daily.    Dispense:  60 tablet    Refill:  5      Physical examination: Blood pressure 128/70, pulse 80, temperature 98.3 F (36.8 C), temperature source Oral, resp. rate 20.  General: Alert, interactive, in no acute distress. HEENT: TMs pearly gray, turbinates mildly edematous without discharge, post-pharynx mildly erythematous. Neck: Supple  without lymphadenopathy. Lungs: Clear to auscultation without wheezing, rhonchi or rales. CV: Normal S1, S2 without murmurs. Skin: Scattered erythematous urticarial type lesions primarily located on the abdomen, back, and arms , nonvesicular.  The following portions of the patient's history were reviewed and updated as appropriate: allergies, current medications, past family history, past medical history, past social history, past surgical history and problem list.    Medication List       This list is accurate as of: 12/18/15  9:05 PM.  Always use your most recent med list.               amLODipine 10 MG tablet  Commonly known as:  NORVASC  Take 10 mg by mouth daily. Reported on 12/18/2015     atenolol 50 MG tablet  Commonly known as:  TENORMIN  Take 50 mg by mouth 2 (two) times daily.     fluticasone 50 MCG/ACT nasal spray  Commonly known as:  FLONASE  USE ONE SPRAY IN EACH NOSTRIL ONCE OR TWICE DAILY AS NEEDED FOR STUFFY NOSE OR DRAINAGE     glimepiride 4 MG tablet  Commonly known as:  AMARYL  Take 4 mg by mouth daily with breakfast.     levocetirizine 5 MG tablet  Commonly known as:  XYZAL  TAKE ONE TABLET ONCE DAILY AS DIRECTED     lisinopril 10 MG tablet  Commonly known as:  PRINIVIL,ZESTRIL  Take 10 mg by mouth daily. Reported on 12/18/2015     metFORMIN 750 MG 24 hr tablet  Commonly known as:  GLUCOPHAGE-XR  Take 750 mg by  mouth 2 (two) times daily.     ranitidine 150 MG tablet  Commonly known as:  ZANTAC  Take 1 tablet (150 mg total) by mouth 2 (two) times daily.        Allergies  Allergen Reactions  . Ace Inhibitors Swelling  . Codeine Nausea And Vomiting    Excessive vomiting  . Percocet [Oxycodone-Acetaminophen]     Disoriented, difficulty waking up and hard to focus, vomiting  . Propoxyphene Other (See Comments)    Darvocet (Propoxyphene-Acetaminophen).  Causes Disorientation, difficulty waking up, difficulty focusing, and vomiting.    I  appreciate the opportunity to take part in this Angelea's care. Please do not hesitate to contact me with questions.  Sincerely,   R. Jorene Guest, MD

## 2015-12-20 ENCOUNTER — Telehealth: Payer: Self-pay

## 2015-12-20 NOTE — Telephone Encounter (Signed)
Left message for to call the office.  Dr. Nunzio Cobbs wanted to make sure that the patient is monitoring her blood sugars while on the prednisone.

## 2015-12-23 NOTE — Telephone Encounter (Signed)
Patient notified. Advised patient that blood work was in but is waiting for Dr Nunzio Cobbs to review. Advised that we will notify her when we hear back from Dr Nunzio Cobbs.

## 2015-12-23 NOTE — Telephone Encounter (Signed)
Pt called about her blood work [phone].

## 2015-12-23 NOTE — Telephone Encounter (Signed)
Many lab results have not been returned, please call the lab to check on this. Also, please inform patient that urea breath test was positive. For H. pylori eradication, we will prescribe omeprazole 20 mg twice daily x 14 days, amoxicillin 1000 mg twice daily x 14 days, and clarithromycin 500 mg twice daily x 14 days.  Thanks.

## 2015-12-24 MED ORDER — CLARITHROMYCIN 500 MG PO TABS: 500.0000 mg | ORAL_TABLET | Freq: Two times a day (BID) | ORAL | Status: AC

## 2015-12-24 MED ORDER — OMEPRAZOLE 20 MG PO CPDR: 20.0000 mg | DELAYED_RELEASE_CAPSULE | Freq: Two times a day (BID) | ORAL | Status: DC

## 2015-12-24 MED ORDER — AMOXICILLIN 500 MG PO TABS: 1000.0000 mg | ORAL_TABLET | Freq: Two times a day (BID) | ORAL | Status: AC

## 2015-12-24 NOTE — Telephone Encounter (Signed)
Left message for patient to call the office and sent scripts in for H. Pylori to [facility].

## 2015-12-24 NOTE — Telephone Encounter (Signed)
Patient informed of positive H. Pylori and antibiotic sent into pharmacy. Told patient to call with update at the end of antibiotic course. Also told patient waiting on couple of results and we got those back will call her back with results.

## 2015-12-24 NOTE — Telephone Encounter (Signed)
Called [facility] and spoke with [place] about pending blood work and they state the Alpha Gal and the Chronic Urticaria panel about both send out blood test and could take longer to get back. Karen Kitchens states that they are sending out Alpha Gal and the Chronic Urticaria panel to the out source lab. She stated they don't know if they have enough blood to do the 2 panels and if not they will comment the test out and let us know.

## 2015-12-24 NOTE — Addendum Note (Signed)
Addended by: Clifton James on: 12/24/2015 02:36 PM   Modules accepted: Orders

## 2016-01-02 LAB — COMPREHENSIVE METABOLIC PANEL
ALT: 14 IU/L (ref 0–32)
AST: 20 IU/L (ref 0–40)
Albumin/Globulin Ratio: 1.4 (ref 1.1–2.5)
Albumin: 4.5 g/dL (ref 3.5–5.5)
Alkaline Phosphatase: 76 IU/L (ref 39–117)
BUN/Creatinine Ratio: 21 (ref 9–23)
BUN: 9 mg/dL (ref 6–24)
Bilirubin Total: 0.9 mg/dL (ref 0.0–1.2)
CO2: 23 mmol/L (ref 18–29)
Calcium: 10.2 mg/dL (ref 8.7–10.2)
Chloride: 96 mmol/L (ref 96–106)
Creatinine, Ser: 0.42 mg/dL — ABNORMAL LOW (ref 0.57–1.00)
GFR calc Af Amer: 130 mL/min/{1.73_m2} (ref >59)
GFR calc non Af Amer: 113 mL/min/{1.73_m2} (ref >59)
Globulin, Total: 3.2 g/dL (ref 1.5–4.5)
Glucose: 181 mg/dL — ABNORMAL HIGH (ref 65–99)
Potassium: 3.2 mmol/L — ABNORMAL LOW (ref 3.5–5.2)
Sodium: 140 mmol/L (ref 134–144)
Total Protein: 7.7 g/dL (ref 6.0–8.5)

## 2016-01-02 LAB — CBC WITH DIFFERENTIAL/PLATELET
Basophils Absolute: 0 10*3/uL (ref 0.0–0.2)
Basos: 0 %
EOS (ABSOLUTE): 0.2 10*3/uL (ref 0.0–0.4)
Eos: 2 %
Hematocrit: 40.1 % (ref 34.0–46.6)
Hemoglobin: 13.3 g/dL (ref 11.1–15.9)
Immature Grans (Abs): 0 10*3/uL (ref 0.0–0.1)
Immature Granulocytes: 0 %
Lymphocytes Absolute: 2.8 10*3/uL (ref 0.7–3.1)
Lymphs: 33 %
MCH: 28.9 pg (ref 26.6–33.0)
MCHC: 33.2 g/dL (ref 31.5–35.7)
MCV: 87 fL (ref 79–97)
Monocytes Absolute: 0.5 10*3/uL (ref 0.1–0.9)
Monocytes: 6 %
Neutrophils Absolute: 5 10*3/uL (ref 1.4–7.0)
Neutrophils: 59 %
Platelets: 338 10*3/uL (ref 150–379)
RBC: 4.6 x10E6/uL (ref 3.77–5.28)
RDW: 13.8 % (ref 12.3–15.4)
WBC: 8.4 10*3/uL (ref 3.4–10.8)

## 2016-01-02 LAB — ALPHA-GAL PANEL
Alpha Gal IgE*: <0.1 kU/L (ref <0.35)
Beef (Bos spp) IgE: <0.1 kU/L (ref <0.35)
Class Interpretation: 0
Class Interpretation: 0
Class Interpretation: 0
Lamb/Mutton (Ovis spp) IgE: <0.1 kU/L (ref <0.35)
Pork (Sus spp) IgE: <0.1 kU/L (ref <0.35)

## 2016-01-02 LAB — ANA W/REFLEX IF POSITIVE: Anti Nuclear Antibody(ANA): NEGATIVE

## 2016-01-02 LAB — CHRONIC URTICARIA: cu index: 3.9 (ref <10)

## 2016-01-02 LAB — TRYPTASE: Tryptase: 4.3 ug/L (ref 2.2–13.2)

## 2016-01-02 LAB — SEDIMENTATION RATE: Sed Rate: 40 mm/hr (ref 0–40)

## 2016-01-08 ENCOUNTER — Ambulatory Visit: Payer: BLUE CROSS/BLUE SHIELD | Admitting: Allergy and Immunology

## 2016-01-09 ENCOUNTER — Other Ambulatory Visit: Payer: Self-pay | Admitting: Neurology

## 2016-01-09 MED ORDER — OMEPRAZOLE 20 MG PO CPDR: 20.0000 mg | DELAYED_RELEASE_CAPSULE | Freq: Every day | ORAL | Status: DC

## 2016-01-21 ENCOUNTER — Other Ambulatory Visit: Payer: Self-pay | Admitting: Orthopedic Surgery

## 2016-01-21 DIAGNOSIS — M542 Cervicalgia: Secondary | ICD-10-CM

## 2016-01-29 ENCOUNTER — Ambulatory Visit (INDEPENDENT_AMBULATORY_CARE_PROVIDER_SITE_OTHER): Payer: BLUE CROSS/BLUE SHIELD | Admitting: Allergy and Immunology

## 2016-01-29 ENCOUNTER — Encounter: Payer: Self-pay | Admitting: Allergy and Immunology

## 2016-01-29 VITALS — BP 130/78 | HR 76 | Temp 97.9°F | Resp 16

## 2016-01-29 DIAGNOSIS — L500 Allergic urticaria: Secondary | ICD-10-CM | POA: Diagnosis not present

## 2016-01-29 DIAGNOSIS — B9681 Helicobacter pylori [H. pylori] as the cause of diseases classified elsewhere: Secondary | ICD-10-CM

## 2016-01-29 DIAGNOSIS — A048 Other specified bacterial intestinal infections: Secondary | ICD-10-CM | POA: Insufficient documentation

## 2016-01-29 DIAGNOSIS — K219 Gastro-esophageal reflux disease without esophagitis: Secondary | ICD-10-CM

## 2016-01-29 DIAGNOSIS — J310 Chronic rhinitis: Secondary | ICD-10-CM | POA: Diagnosis not present

## 2016-01-29 MED ORDER — OMEPRAZOLE 20 MG PO CPDR
20.0000 mg | DELAYED_RELEASE_CAPSULE | Freq: Every day | ORAL | Status: DC
Start: 2016-01-29 — End: 2020-03-08

## 2016-01-29 MED ORDER — LEVOCETIRIZINE DIHYDROCHLORIDE 5 MG PO TABS: ORAL_TABLET | ORAL | Status: DC

## 2016-01-29 MED ORDER — AMOXICILLIN 500 MG PO CAPS: 1000.0000 mg | ORAL_CAPSULE | Freq: Two times a day (BID) | ORAL | Status: DC

## 2016-01-29 MED ORDER — CLARITHROMYCIN ER 500 MG PO TB24: 500.0000 mg | ORAL_TABLET | Freq: Two times a day (BID) | ORAL | Status: DC

## 2016-01-29 NOTE — Assessment & Plan Note (Signed)
   A refill prescription has been provided for omeprazole 20 mg daily prior to meal.  Continue appropriate lifestyle modifications.

## 2016-01-29 NOTE — Assessment & Plan Note (Signed)
   Continue fluticasone nasal spray, one spray per nostril 1-2 times daily as needed. 

## 2016-01-29 NOTE — Progress Notes (Signed)
Follow-up Note  RE: Sharon Munoz MRN: [id number] DOB: June 24, 1956 Date of Office Visit: 01/29/2016  Primary care provider: Laurena Slimmer, MD Referring provider: Laurena Slimmer, MD  History of present illness: HPI Comments: Sharon Munoz is a 60 y.o. female with chronic urticaria and associated angioedema, nonallergic rhinitis, and gastroesophageal reflux disease presents today for follow up.  She reports that her urticaria had been well-controlled while on levocetirizine in the morning, cetirizine in the evening, and ranitidine 150 mg twice a day.  However, over this past week she ran out of levocetirizine and experienced recurrence of hives on her lower back and right buttock over the weekend.  She did not experience angioedema or concomitant cardiopulmonary or GI symptoms.  She believes that the urticaria flares up with the consumption of grapes or oranges, therefore she has eliminated these from her diet.  Urea breath test was positive.   Assessment and plan: Chronic urticaria Urea breath test positive.  For H. pylori eradication, we will prescribe omeprazole 20 mg twice daily x 14 days, amoxicillin 1000 mg twice daily x 14 days, and clarithromycin 500 mg twice daily x 14 days.   A refill prescription has been provided for levocetirizine 5 mg daily as needed.  Continue H1/H2 receptor blockade, titrating to the lowest effective dose necessary to suppress urticaria.  Should significant symptoms or new symptoms occur, a journal is to be kept recording any foods eaten, beverages consumed, medications taken, activities performed, and environmental conditions within a 6 hour time period prior to the onset of symptoms. For any symptoms concerning for anaphylaxis, 911 is to be called immediately.   As her symptoms seem to be exacerbated by salicylate containing food, an information sheet regarding salicylate-free diet has been provided and discussed.   GERD  A refill prescription  has been provided for omeprazole 20 mg daily prior to meal.  Continue appropriate lifestyle modifications.  Chronic rhinitis  Continue fluticasone nasal spray, one spray per nostril 1-2 times daily as needed.   H. pylori infection  Prescriptions and instructions have been provided for triple therapy (as above).    Meds ordered this encounter  Medications  . levocetirizine (XYZAL) 5 MG tablet    Sig: TAKE ONE TABLET ONCE DAILY AS DIRECTED    Dispense:  30 tablet    Refill:  5  . omeprazole (PRILOSEC) 20 MG capsule    Sig: Take 1 capsule (20 mg total) by mouth daily.    Dispense:  30 capsule    Refill:  0  . amoxicillin (AMOXIL) 500 MG capsule    Sig: Take 2 capsules (1,000 mg total) by mouth 2 (two) times daily.    Dispense:  56 capsule    Refill:  0  . clarithromycin (BIAXIN XL) 500 MG 24 hr tablet    Sig: Take 1 tablet (500 mg total) by mouth 2 (two) times daily.    Dispense:  28 tablet    Refill:  0   Diagnositcs:  Urea breath test was positive, non-fasted blood glucose was 181, serum creatinine was 0.42, and potassium was 3.2.  All other labs were negative/within normal limits.  Physical examination: Blood pressure 130/78, pulse 76, temperature 97.9 F (36.6 C), temperature source Oral, resp. rate 16.  General: Alert, interactive, in no acute distress. HEENT: TMs pearly gray, turbinates mildly edematous without discharge, post-pharynx mildly erythematous. Neck: Supple without lymphadenopathy. Lungs: Clear to auscultation without wheezing, rhonchi or rales. CV: Normal S1, S2 without murmurs. Skin:  Warm and dry, without lesions or rashes.  The following portions of the patient's history were reviewed and updated as appropriate: allergies, current medications, past family history, past medical history, past social history, past surgical history and problem list.    Medication List       This list is accurate as of: 01/29/16  7:10 PM.  Always use your most recent med  list.               amLODipine 10 MG tablet  Commonly known as:  NORVASC  Take 10 mg by mouth daily. Reported on 12/18/2015     amoxicillin 500 MG capsule  Commonly known as:  AMOXIL  Take 2 capsules (1,000 mg total) by mouth 2 (two) times daily.     atenolol 50 MG tablet  Commonly known as:  TENORMIN  Take 50 mg by mouth 2 (two) times daily.     cetirizine 10 MG chewable tablet  Commonly known as:  ZYRTEC  Chew 10 mg by mouth 2 (two) times daily.     clarithromycin 500 MG 24 hr tablet  Commonly known as:  BIAXIN XL  Take 1 tablet (500 mg total) by mouth 2 (two) times daily.     diphenhydrAMINE 25 MG tablet  Commonly known as:  BENADRYL  Take 25 mg by mouth. 1-2 tablets twice daily as needed.     fluticasone 50 MCG/ACT nasal spray  Commonly known as:  FLONASE  USE ONE SPRAY IN EACH NOSTRIL ONCE OR TWICE DAILY AS NEEDED FOR STUFFY NOSE OR DRAINAGE     glimepiride 4 MG tablet  Commonly known as:  AMARYL  Take 4 mg by mouth daily with breakfast.     levocetirizine 5 MG tablet  Commonly known as:  XYZAL  TAKE ONE TABLET ONCE DAILY AS DIRECTED     lisinopril 10 MG tablet  Commonly known as:  PRINIVIL,ZESTRIL  Take 10 mg by mouth daily. Reported on 01/29/2016     metFORMIN 750 MG 24 hr tablet  Commonly known as:  GLUCOPHAGE-XR  Take 750 mg by mouth 2 (two) times daily.     omeprazole 20 MG capsule  Commonly known as:  PRILOSEC  Take 1 capsule (20 mg total) by mouth 2 (two) times daily before a meal.     omeprazole 20 MG capsule  Commonly known as:  PRILOSEC  Take 1 capsule (20 mg total) by mouth daily.     omeprazole 20 MG capsule  Commonly known as:  PRILOSEC  Take 1 capsule (20 mg total) by mouth daily.     ranitidine 150 MG tablet  Commonly known as:  ZANTAC  Take 1 tablet (150 mg total) by mouth 2 (two) times daily.        Allergies  Allergen Reactions  . Ace Inhibitors Swelling  . Codeine Nausea And Vomiting    Excessive vomiting  . Percocet  [Oxycodone-Acetaminophen]     Disoriented, difficulty waking up and hard to focus, vomiting  . Propoxyphene Other (See Comments)    Darvocet (Propoxyphene-Acetaminophen).  Causes Disorientation, difficulty waking up, difficulty focusing, and vomiting.   Review of systems: Constitutional: Negative for fever, chills and weight loss.  HENT: Negative for nosebleeds.   Positive for nasal congestion. Eyes: Negative for blurred vision.  Respiratory: Negative for hemoptysis.   Cardiovascular: Negative for chest pain.  Gastrointestinal: Negative for diarrhea and constipation.  Genitourinary: Negative for dysuria.  Musculoskeletal: Negative for myalgias and joint pain.  Neurological: Negative for dizziness.  Endo/Heme/Allergies: Does not bruise/bleed easily.  Cutaneous: Positive for urticaria.  Past Medical History  Diagnosis Date  . Diabetes mellitus   . Hypertension     Family History  Problem Relation Age of Onset  . Hypertension Mother   . Asthma Father   . Hypertension Father   . Kidney cancer Father   . Asthma Brother   . Eczema Neg Hx   . Immunodeficiency Neg Hx   . Urticaria Neg Hx     Social History   Social History  . Marital Status: Legally Separated    Spouse Name: N/A  . Number of Children: N/A  . Years of Education: N/A   Occupational History  . Not on file.   Social History Main Topics  . Smoking status: Never Smoker   . Smokeless tobacco: Never Used  . Alcohol Use: No  . Drug Use: Not on file  . Sexual Activity: Not on file   Other Topics Concern  . Not on file   Social History Narrative    I appreciate the opportunity to take part in this Kristene's care. Please do not hesitate to contact me with questions.  Sincerely,   R. Jorene Guest, MD

## 2016-01-29 NOTE — Assessment & Plan Note (Signed)
   Prescriptions and instructions have been provided for triple therapy (as above).

## 2016-01-29 NOTE — Patient Instructions (Addendum)
Chronic urticaria Urea breath test positive.  For H. pylori eradication, we will prescribe omeprazole 20 mg twice daily x 14 days, amoxicillin 1000 mg twice daily x 14 days, and clarithromycin 500 mg twice daily x 14 days.   A refill prescription has been provided for levocetirizine 5 mg daily as needed.  Continue H1/H2 receptor blockade, titrating to the lowest effective dose necessary to suppress urticaria.  Should significant symptoms or new symptoms occur, a journal is to be kept recording any foods eaten, beverages consumed, medications taken, activities performed, and environmental conditions within a 6 hour time period prior to the onset of symptoms. For any symptoms concerning for anaphylaxis, 911 is to be called immediately.   As her symptoms seem to be exacerbated by salicylate containing food, an information sheet regarding salicylate-free diet has been provided and discussed.   GERD  A refill prescription has been provided for omeprazole 20 mg daily prior to meal.  Continue appropriate lifestyle modifications.  Chronic rhinitis  Continue fluticasone nasal spray, one spray per nostril 1-2 times daily as needed.   H. pylori infection  Prescriptions and instructions have been provided for triple therapy (as above).    Return in about 4 months (around 05/30/2016), or if symptoms worsen or fail to improve.

## 2016-01-29 NOTE — Assessment & Plan Note (Addendum)
Urea breath test positive.  For H. pylori eradication, we will prescribe omeprazole 20 mg twice daily x 14 days, amoxicillin 1000 mg twice daily x 14 days, and clarithromycin 500 mg twice daily x 14 days.   A refill prescription has been provided for levocetirizine 5 mg daily as needed.  Continue H1/H2 receptor blockade, titrating to the lowest effective dose necessary to suppress urticaria.  Should significant symptoms or new symptoms occur, a journal is to be kept recording any foods eaten, beverages consumed, medications taken, activities performed, and environmental conditions within a 6 hour time period prior to the onset of symptoms. For any symptoms concerning for anaphylaxis, 911 is to be called immediately.   As her symptoms seem to be exacerbated by salicylate containing food, an information sheet regarding salicylate-free diet has been provided and discussed.

## 2016-01-31 ENCOUNTER — Ambulatory Visit
Admission: RE | Admit: 2016-01-31 | Discharge: 2016-01-31 | Disposition: A | Discharge status: Home / Self Care | Payer: BLUE CROSS/BLUE SHIELD | Source: Ambulatory Visit | Attending: Orthopedic Surgery | Admitting: Orthopedic Surgery

## 2016-01-31 DIAGNOSIS — M542 Cervicalgia: Secondary | ICD-10-CM

## 2016-02-04 ENCOUNTER — Telehealth: Payer: Self-pay

## 2016-02-04 NOTE — Telephone Encounter (Signed)
Lm for pt to call us back to ask her what questions she has

## 2016-02-04 NOTE — Telephone Encounter (Signed)
Patient was seen on last Wednesday by Dr. Nunzio CobbsBobbitt, and she has some questions from the AVS she received.  Please Advise  Thanks

## 2016-02-05 NOTE — Telephone Encounter (Signed)
Left voice message to call office back.

## 2016-02-05 NOTE — Telephone Encounter (Signed)
Have her avoid any foods that she suspects as a trigger and see if symptoms continue to occur despite having eliminated those from the diet.  Thanks.

## 2016-02-05 NOTE — Telephone Encounter (Signed)
Pt is keeping her journal and believes she has narrowed down her reactions to peaches, eggs, grapes, and did have a reaction after eatting a protein bar. I informed her that the protein bar could contain grapes or dried peaches.

## 2016-02-10 NOTE — Telephone Encounter (Signed)
Patient had received instructions in regards to Dr. Theodoro ParmaBobbits message om [phone]

## 2016-02-12 ENCOUNTER — Ambulatory Visit (INDEPENDENT_AMBULATORY_CARE_PROVIDER_SITE_OTHER): Payer: BLUE CROSS/BLUE SHIELD | Admitting: Neurology

## 2016-02-12 DIAGNOSIS — M79641 Pain in right hand: Secondary | ICD-10-CM | POA: Diagnosis not present

## 2016-02-12 DIAGNOSIS — G5602 Carpal tunnel syndrome, left upper limb: Secondary | ICD-10-CM

## 2016-02-12 DIAGNOSIS — G5601 Carpal tunnel syndrome, right upper limb: Secondary | ICD-10-CM | POA: Diagnosis not present

## 2016-02-12 DIAGNOSIS — E0800 Diabetes mellitus due to underlying condition with hyperosmolarity without nonketotic hyperglycemic-hyperosmolar coma (NKHHC): Secondary | ICD-10-CM

## 2016-02-12 DIAGNOSIS — M79642 Pain in left hand: Secondary | ICD-10-CM | POA: Diagnosis not present

## 2016-02-12 DIAGNOSIS — G5603 Carpal tunnel syndrome, bilateral upper limbs: Secondary | ICD-10-CM

## 2016-02-12 MED ORDER — GABAPENTIN 100 MG PO CAPS: 300.0000 mg | ORAL_CAPSULE | Freq: Three times a day (TID) | ORAL | Status: AC

## 2016-02-12 NOTE — Procedures (Signed)
   NCS (NERVE CONDUCTION STUDY) WITH EMG (ELECTROMYOGRAPHY) REPORT   STUDY DATE: February 12 2016 PATIENT NAME: Sharon Munoz DOB: 12/10/1955 MRN: [id number]    TECHNOLOGIST: Gearldine ShownLorraine Jones ELECTROMYOGRAPHER: Levert FeinsteinYan, Carmencita Cusic M.D.  CLINICAL INFORMATION:  60 years old right-handed female, with history of carpal tunnel syndrome, presenting with a year history of worsening bilateral hands paresthesia, right worse than left  FINDINGS: NERVE CONDUCTION STUDY: Bilateral ulnar sensory and motor responses were normal. Bilateral median sensory response showed mildly prolonged peak latency was well preserved snap amplitude. Bilateral median motor responses showed mildly prolonged distal latency, with normal C map amplitude, conduction velocity.  NEEDLE ELECTROMYOGRAPHY: Selective needle examinations were performed at right upper extremity muscles, right cervical paraspinal muscles, left abductor pollicis brevis  Bilateral abductor pollicis brevis: Normal insertion activity, no spontaneous activity, mildly enlarged motor unit potential, with mildly decreased recruitment patterns.  Selected needle examination of right pronator teres, biceps, triceps, deltoid, first dorsal interossei was normal.  There was no spontaneous activity at right cervical paraspinal muscles, right C5-6 and 7.  IMPRESSION:   This is an abnormal study. There is electrodiagnostic evidence of median neuropathy across the wrist, consistent with moderate bilateral carpal tunnel syndromes. There is no evidence of right cervical radiculopathy.   INTERPRETING PHYSICIAN:   Levert FeinsteinYan, Hillary Schwegler M.D. Ph.D. [facility] [address], [address] [place], [place] [id number] [phone]

## 2016-02-12 NOTE — Progress Notes (Signed)
PATIENT: Sharon Munoz DOB: [birth year]  No chief complaint on file.    HISTORICAL  Titilayo R Channell 60 years old right-handed female,, seen in refer by Dr. Frederico Hamman for evaluation of bilateral hands paresthesia  She had a history of hypertension, diabetes, known history of carpal tunnel syndromes, previously was treated by wrist splint, and the wrist area injection, last injection was more than 10 years ago.  She works as a Architectural technologist now, while typing, writing on the backboard, she noticed worsening bilateral hands paresthesia in past 6 months, most involvement of bilateral first 3 fingers, right worse than left, mild subjective weakness due to pain, her bilateral hands paresthesia also triggered by driving, holding a phone, she has frequent nocturnal paresthesia, awakening, she has to take 2 weeks off because of her bilateral hand symptoms, she has tried different over-the-counter medications, ibuprofen, Tylenol, with limited help, she has been wearing wrist splint all the time with no significant improvement.  She also complains of recent development of hives, intermittent neck pain, bilateral hands pain also radiating up from the volar surface of the forearm to above elbow level, She denies bilateral feet paresthesia, no gait difficulty, no bowel and bladder incontinence.  Today's electrodiagnostic study showed no evidence of right cervical radiculopathy, there is evidence of moderate bilateral carpal tunnel syndromes.  We also personally reviewed MRI of the cervical spine in March 2017: Multilevel degenerative disc disease, most prominent abnormalities at C6-7, there is a broad-based disc herniation, with extension into both neural foraminal, with mild to moderate bilateral foraminal stenosis, no cord signal changes.  Laboratory since January 2017, normal CMP, with exception of elevated glucose 181, normal CBC, negative ANA  REVIEW OF SYSTEMS: Full 14 system review of  systems performed and notable only for as above  ALLERGIES: Allergies  Allergen Reactions  . Ace Inhibitors Swelling  . Codeine Nausea And Vomiting    Excessive vomiting  . Percocet [Oxycodone-Acetaminophen]     Disoriented, difficulty waking up and hard to focus, vomiting  . Propoxyphene Other (See Comments)    Darvocet (Propoxyphene-Acetaminophen).  Causes Disorientation, difficulty waking up, difficulty focusing, and vomiting.    HOME MEDICATIONS: Current Outpatient Prescriptions  Medication Sig Dispense Refill  . amLODipine (NORVASC) 10 MG tablet Take 10 mg by mouth daily. Reported on 12/18/2015    . amoxicillin (AMOXIL) 500 MG capsule Take 2 capsules (1,000 mg total) by mouth 2 (two) times daily. 56 capsule 0  . atenolol (TENORMIN) 50 MG tablet Take 50 mg by mouth 2 (two) times daily.    . cetirizine (ZYRTEC) 10 MG chewable tablet Chew 10 mg by mouth 2 (two) times daily.    . clarithromycin (BIAXIN XL) 500 MG 24 hr tablet Take 1 tablet (500 mg total) by mouth 2 (two) times daily. 28 tablet 0  . diphenhydrAMINE (BENADRYL) 25 MG tablet Take 25 mg by mouth. 1-2 tablets twice daily as needed.    . fluticasone (FLONASE) 50 MCG/ACT nasal spray USE ONE SPRAY IN EACH NOSTRIL ONCE OR TWICE DAILY AS NEEDED FOR STUFFY NOSE OR DRAINAGE 16 g 5  . glimepiride (AMARYL) 4 MG tablet Take 4 mg by mouth daily with breakfast.    . levocetirizine (XYZAL) 5 MG tablet TAKE ONE TABLET ONCE DAILY AS DIRECTED 30 tablet 5  . lisinopril (PRINIVIL,ZESTRIL) 10 MG tablet Take 10 mg by mouth daily. Reported on 01/29/2016    . metFORMIN (GLUCOPHAGE-XR) 750 MG 24 hr tablet Take 750 mg by mouth 2 (  two) times daily.    Marland Kitchen. omeprazole (PRILOSEC) 20 MG capsule Take 1 capsule (20 mg total) by mouth 2 (two) times daily before a meal. 28 capsule 0  . omeprazole (PRILOSEC) 20 MG capsule Take 1 capsule (20 mg total) by mouth daily. (Patient not taking: Reported on 01/29/2016) 30 capsule 5  . omeprazole (PRILOSEC) 20 MG capsule  Take 1 capsule (20 mg total) by mouth daily. 30 capsule 0  . ranitidine (ZANTAC) 150 MG tablet Take 1 tablet (150 mg total) by mouth 2 (two) times daily. 60 tablet 5   No current facility-administered medications for this visit.    PAST MEDICAL HISTORY: Past Medical History  Diagnosis Date  . Diabetes mellitus   . Hypertension     PAST SURGICAL HISTORY: No past surgical history on file.  FAMILY HISTORY: Family History  Problem Relation Age of Onset  . Hypertension Mother   . Asthma Father   . Hypertension Father   . Kidney cancer Father   . Asthma Brother   . Eczema Neg Hx   . Immunodeficiency Neg Hx   . Urticaria Neg Hx     SOCIAL HISTORY:  Social History   Social History  . Marital Status: Legally Separated    Spouse Name: N/A  . Number of Children: N/A  . Years of Education: N/A   Occupational History   teacher    Social History Main Topics  . Smoking status: Never Smoker   . Smokeless tobacco: Never Used  . Alcohol Use: No  . Drug Use: Not on file  . Sexual Activity: Not on file   Other Topics Concern  . Not on file   Social History Narrative     PHYSICAL EXAM   There were no vitals filed for this visit.  Not recorded      There is no weight on file to calculate BMI.  PHYSICAL EXAMNIATION:  Gen: NAD, conversant, well nourised, obese, well groomed                     Cardiovascular: Regular rate rhythm, no peripheral edema, warm, nontender. Eyes: Conjunctivae clear without exudates or hemorrhage Neck: Supple, no carotid bruise. Pulmonary: Clear to auscultation bilaterally   NEUROLOGICAL EXAM:  MENTAL STATUS: Speech:    Speech is normal; fluent and spontaneous with normal comprehension.  Cognition:     Orientation to time, place and person     Normal recent and remote memory     Normal Attention span and concentration     Normal Language, naming, repeating,spontaneous speech     Fund of knowledge   CRANIAL NERVES: CN II: Visual  fields are full to confrontation. Fundoscopic exam is normal with sharp discs and no vascular changes. Pupils are round equal and briskly reactive to light. CN III, IV, VI: extraocular movement are normal. No ptosis. CN V: Facial sensation is intact to pinprick in all 3 divisions bilaterally. Corneal responses are intact.  CN VII: Face is symmetric with normal eye closure and smile. CN VIII: Hearing is normal to rubbing fingers CN IX, X: Palate elevates symmetrically. Phonation is normal. CN XI: Head turning and shoulder shrug are intact CN XII: Tongue is midline with normal movements and no atrophy.  MOTOR: There is no pronator drift of out-stretched arms. Muscle bulk and tone are normal. She has bilateral wrist Tinel signs, mild bilateral abductor pollicis brevis, and opponens weakness  REFLEXES: Reflexes are 2+ and symmetric at the biceps, triceps, knees, and  ankles. Plantar responses are flexor.  SENSORY: Intact to light touch, pinprick, position sense, and vibration sense are intact in fingers and toes.  COORDINATION: Rapid alternating movements and fine finger movements are intact. There is no dysmetria on finger-to-nose and heel-knee-shin.    GAIT/STANCE: Posture is normal. Gait is steady with normal steps, base, arm swing, and turning. Heel and toe walking are normal. Tandem gait is normal.  Romberg is absent.   DIAGNOSTIC DATA (LABS, IMAGING, TESTING) - I reviewed patient records, labs, notes, testing and imaging myself where available.   ASSESSMENT AND PLAN  Peyson R Merlino is a 60 y.o. female   Bilateral carpal tunnel syndromes  Tried and failed NSAIDs, Tylenol, wrist splint  Will try gabapentin 100 mg titrating to 300 mg 3 tablets 3 times a day.   Levert Feinstein, M.D. Ph.D.  [facility] [address] [place], [place] [address] Ph: [phone] Fax: [phone]  CC: Frederico Hamman, MD

## 2016-03-02 DIAGNOSIS — I10 Essential (primary) hypertension: Secondary | ICD-10-CM | POA: Diagnosis not present

## 2016-03-02 DIAGNOSIS — M150 Primary generalized (osteo)arthritis: Secondary | ICD-10-CM | POA: Diagnosis not present

## 2016-03-02 DIAGNOSIS — E119 Type 2 diabetes mellitus without complications: Secondary | ICD-10-CM | POA: Diagnosis not present

## 2016-03-02 DIAGNOSIS — L509 Urticaria, unspecified: Secondary | ICD-10-CM | POA: Diagnosis not present

## 2016-04-02 DIAGNOSIS — J029 Acute pharyngitis, unspecified: Secondary | ICD-10-CM | POA: Diagnosis not present

## 2016-04-02 DIAGNOSIS — J309 Allergic rhinitis, unspecified: Secondary | ICD-10-CM | POA: Diagnosis not present

## 2016-04-02 DIAGNOSIS — E119 Type 2 diabetes mellitus without complications: Secondary | ICD-10-CM | POA: Diagnosis not present

## 2016-04-02 DIAGNOSIS — I10 Essential (primary) hypertension: Secondary | ICD-10-CM | POA: Diagnosis not present

## 2016-06-01 ENCOUNTER — Ambulatory Visit: Payer: BLUE CROSS/BLUE SHIELD | Admitting: Allergy and Immunology

## 2016-06-03 ENCOUNTER — Ambulatory Visit: Payer: BLUE CROSS/BLUE SHIELD | Admitting: Allergy and Immunology

## 2016-06-08 DIAGNOSIS — E119 Type 2 diabetes mellitus without complications: Secondary | ICD-10-CM | POA: Diagnosis not present

## 2016-06-08 DIAGNOSIS — I10 Essential (primary) hypertension: Secondary | ICD-10-CM | POA: Diagnosis not present

## 2016-06-23 ENCOUNTER — Ambulatory Visit: Payer: BLUE CROSS/BLUE SHIELD | Admitting: Allergy and Immunology

## 2016-07-02 DIAGNOSIS — I10 Essential (primary) hypertension: Secondary | ICD-10-CM | POA: Diagnosis not present

## 2016-07-02 DIAGNOSIS — E119 Type 2 diabetes mellitus without complications: Secondary | ICD-10-CM | POA: Diagnosis not present

## 2016-07-02 DIAGNOSIS — E1165 Type 2 diabetes mellitus with hyperglycemia: Secondary | ICD-10-CM | POA: Diagnosis not present

## 2016-07-16 DIAGNOSIS — E1165 Type 2 diabetes mellitus with hyperglycemia: Secondary | ICD-10-CM | POA: Diagnosis not present

## 2016-09-26 ENCOUNTER — Other Ambulatory Visit: Payer: Self-pay | Admitting: Allergy and Immunology

## 2016-09-26 DIAGNOSIS — L500 Allergic urticaria: Secondary | ICD-10-CM

## 2017-02-03 DIAGNOSIS — I10 Essential (primary) hypertension: Secondary | ICD-10-CM | POA: Diagnosis not present

## 2017-02-03 DIAGNOSIS — E1165 Type 2 diabetes mellitus with hyperglycemia: Secondary | ICD-10-CM | POA: Diagnosis not present

## 2017-02-23 ENCOUNTER — Other Ambulatory Visit: Payer: Self-pay | Admitting: Neurology

## 2017-06-01 DIAGNOSIS — E1165 Type 2 diabetes mellitus with hyperglycemia: Secondary | ICD-10-CM | POA: Diagnosis not present

## 2017-06-01 DIAGNOSIS — E559 Vitamin D deficiency, unspecified: Secondary | ICD-10-CM | POA: Diagnosis not present

## 2017-06-01 DIAGNOSIS — E119 Type 2 diabetes mellitus without complications: Secondary | ICD-10-CM | POA: Diagnosis not present

## 2017-06-01 DIAGNOSIS — I10 Essential (primary) hypertension: Secondary | ICD-10-CM | POA: Diagnosis not present

## 2017-06-18 IMAGING — MR MR CERVICAL SPINE W/O CM
4 of 5 series · 28 of 48 positions shown · non-contrast
Comparison: None.

CLINICAL DATA: Severe pain in the right arm with numbness and
tingling, 2 months duration.

EXAM:
MRI CERVICAL SPINE WITHOUT CONTRAST
TECHNIQUE: Multiplanar, multisequence MR imaging of the cervical spine was
performed. No intravenous contrast was administered.

[Series 3: T2 · sagittal · 3.0mm · 0.66mm/px · 6 of 13 slices shown (1 of 2)]
[im 1/13]
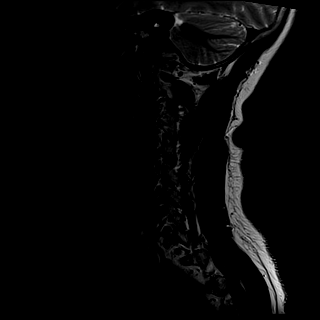
[im 3/13]
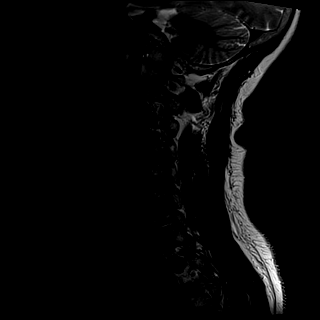
[im 5/13]
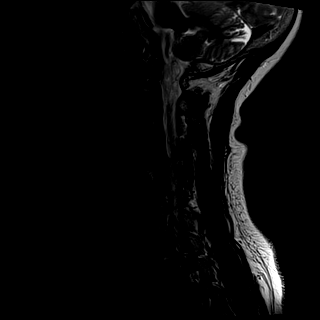
[im 8/13]
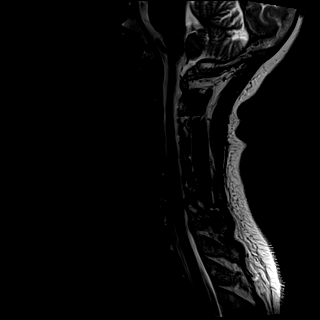
[im 10/13]
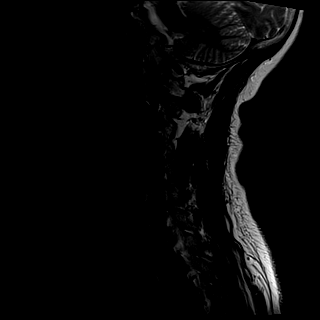
[im 13/13]
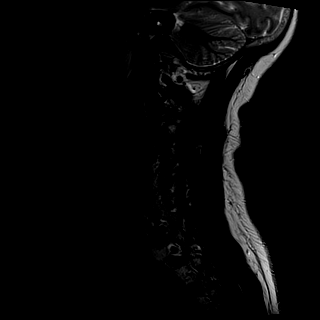

[Series 4: T1 · sagittal · 3.0mm · 0.41mm/px · 7 of 13 slices shown]
[im 1/13]
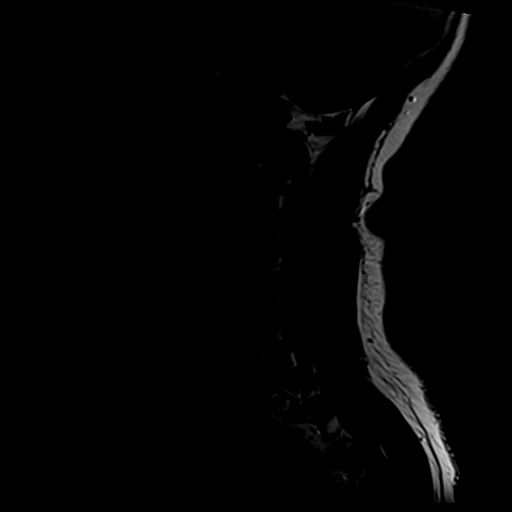
[im 3/13]
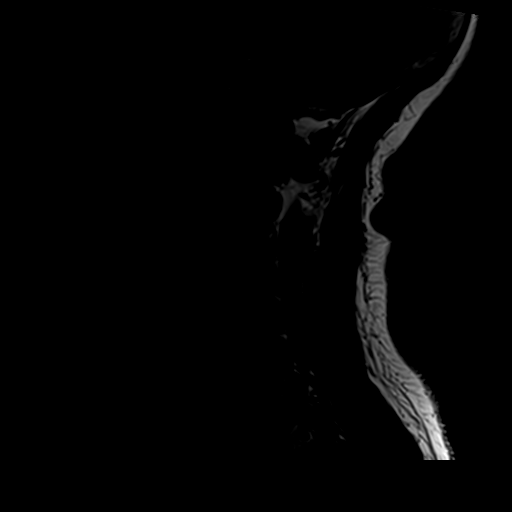
[im 5/13]
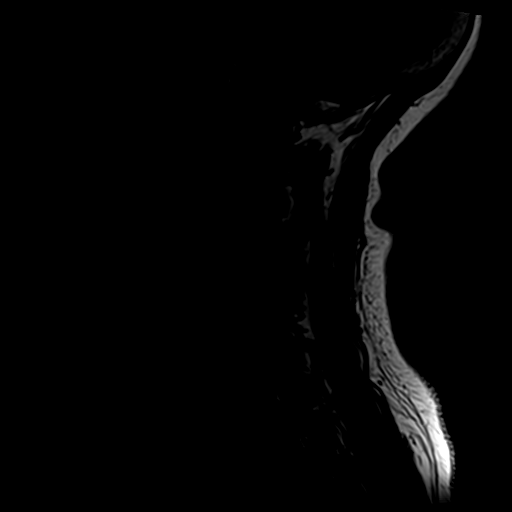
[im 7/13]
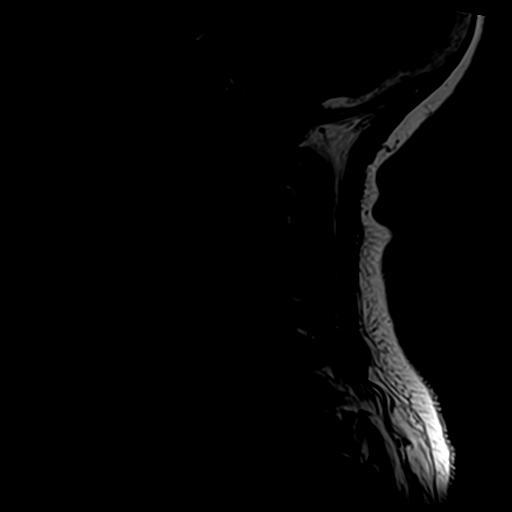
[im 9/13]
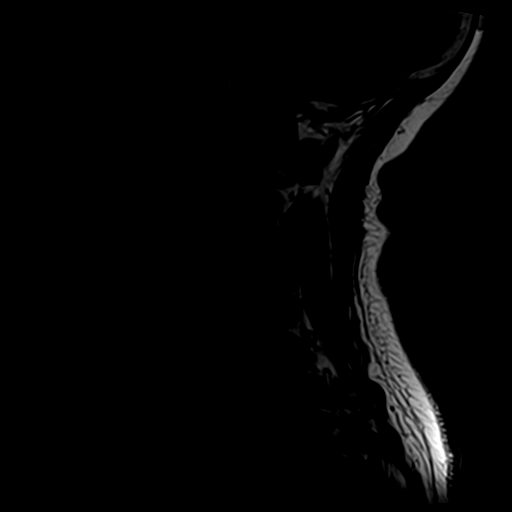
[im 11/13]
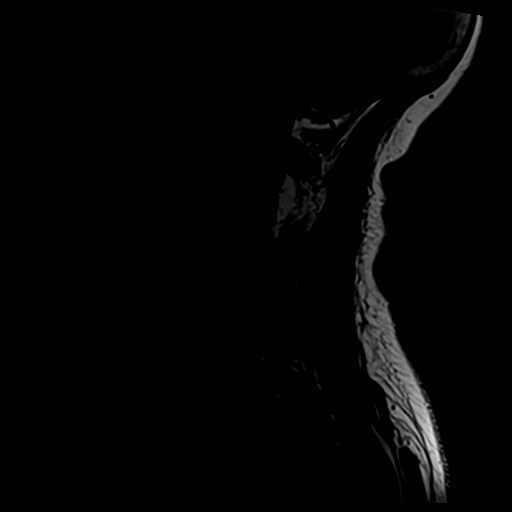
[im 13/13]
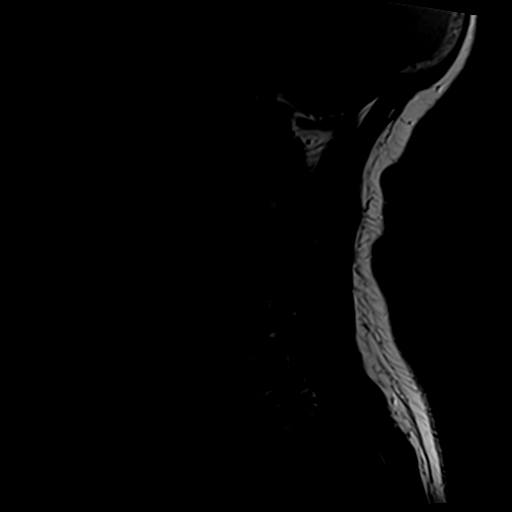

[Series 5: tir sag · sagittal · 3.0mm · 0.41mm/px · 7 of 13 slices shown]
[im 1/13]
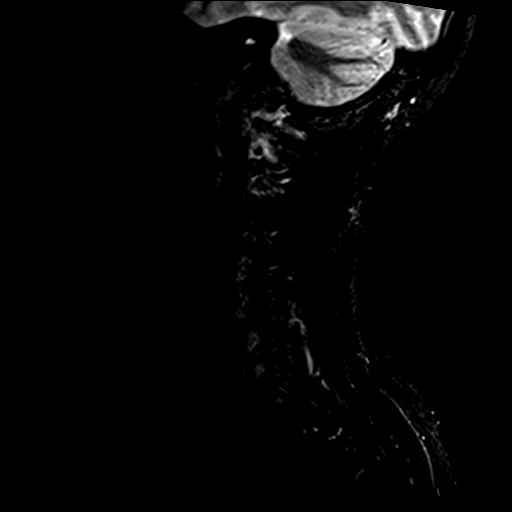
[im 3/13]
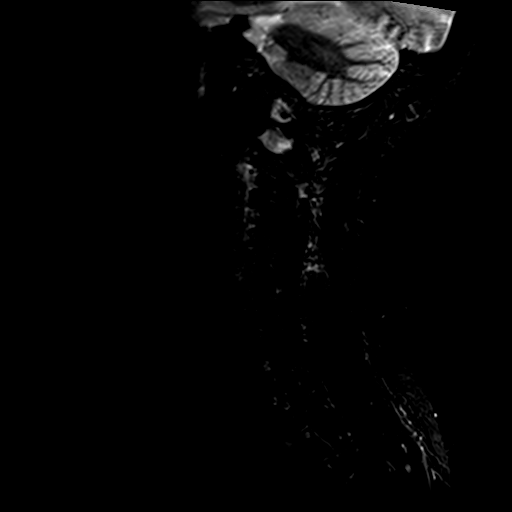
[im 5/13]
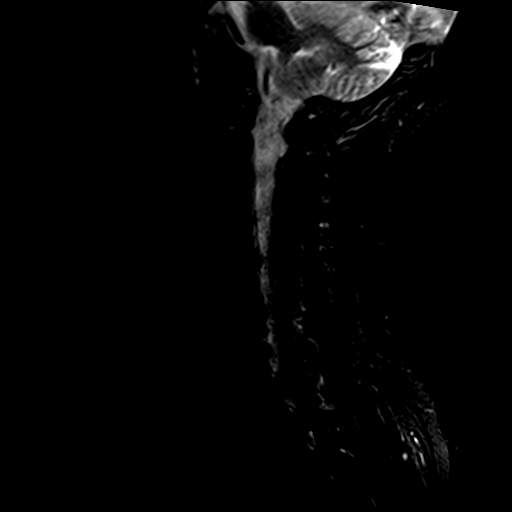
[im 7/13]
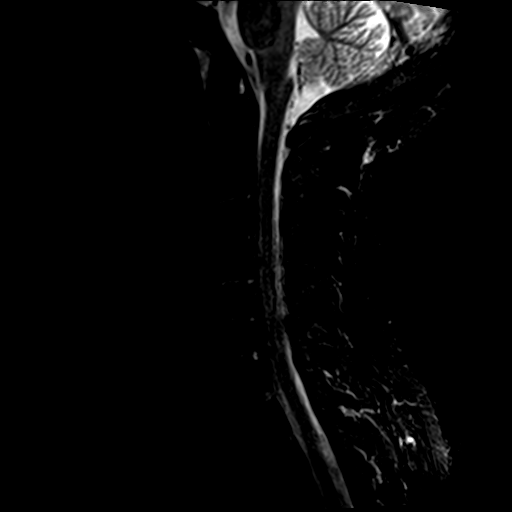
[im 9/13]
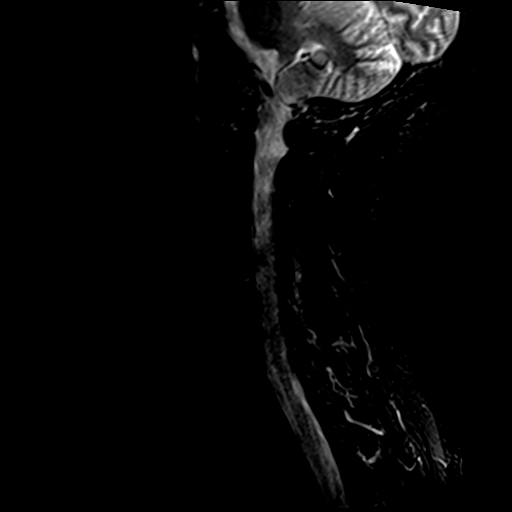
[im 11/13]
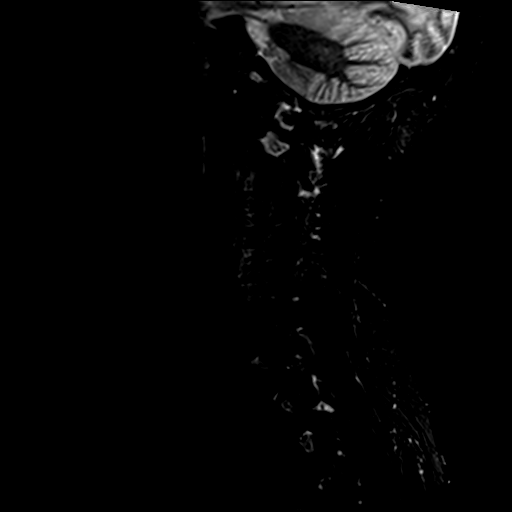
[im 13/13]
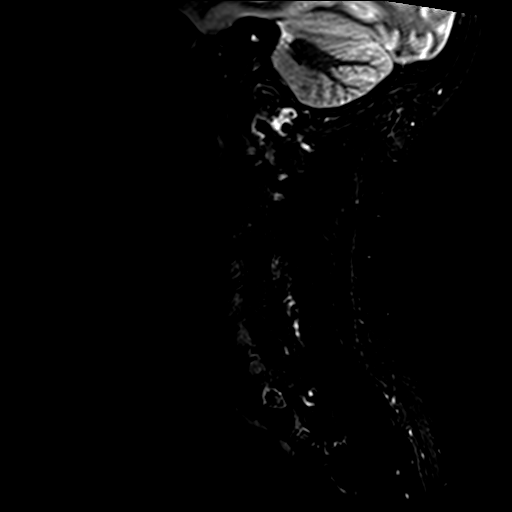

[Series 7: T2 · axial · 3.0mm · 0.70mm/px · z∈[-48,+41]mm · 8 of 28 slices shown (2 of 2)]
[im 1/28]
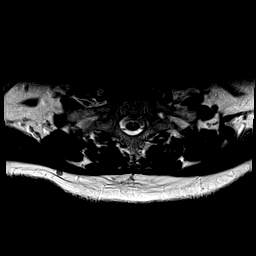
[im 5/28]
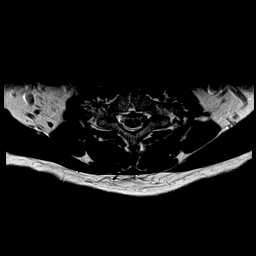
[im 9/28]
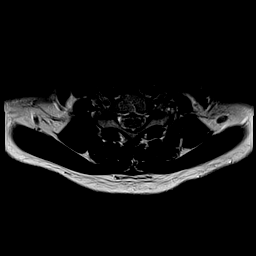
[im 13/28]
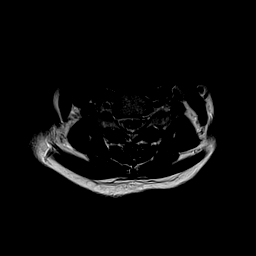
[im 15/28]
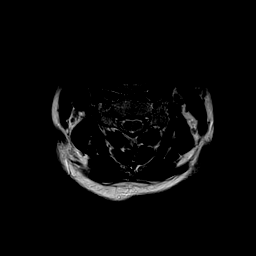
[im 19/28]
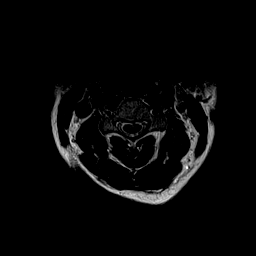
[im 23/28]
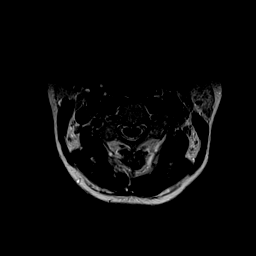
[im 28/28]
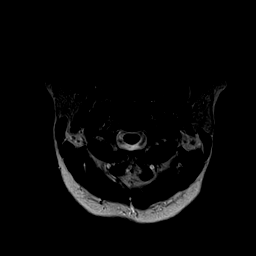

[28 of 48 positions shown; findings below may reference images not displayed]

FINDINGS: The examination suffers from motion degradation.

No abnormality at the foramen magnum, C1-2, C2-3 or C3-4.

C4-5: Mild bulging of the disc. Mild foraminal narrowing bilaterally
without visible neural compression.

C5-6: Moderate bulging of the disc. Mild foraminal narrowing
bilaterally without visible neural compression.

C6-7: Broad-based disc herniation effaces the ventral subarachnoid
space and contacts the ventral cord. Foraminal encroachment
bilaterally could affect either or both C7 nerve roots. This is
slightly more pronounced on the right.

C7-T1: Shallow disc herniation in the right posterior lateral
direction. No central canal stenosis. Mild foraminal encroachment on
the right.
IMPRESSION: The most prominent abnormality is at C6-7 where there is a
broad-based disc herniation with extension into both neural foramina
that could compress either or both C7 nerve roots. This appears
slightly more pronounced on the right.

Shallow right posterior lateral disc herniation at C7-T1 extends
towards the foramen on the right. This could possibly irritate the
right C8 nerve root, but distinct compression is not demonstrated.

Disc bulges at C4-5 and C5-6 with mild foraminal narrowing
bilaterally at those levels.

## 2017-08-25 DIAGNOSIS — E119 Type 2 diabetes mellitus without complications: Secondary | ICD-10-CM | POA: Diagnosis not present

## 2017-08-25 DIAGNOSIS — H5203 Hypermetropia, bilateral: Secondary | ICD-10-CM | POA: Diagnosis not present

## 2017-09-13 DIAGNOSIS — E119 Type 2 diabetes mellitus without complications: Secondary | ICD-10-CM | POA: Diagnosis not present

## 2017-09-13 DIAGNOSIS — Z23 Encounter for immunization: Secondary | ICD-10-CM | POA: Diagnosis not present

## 2017-09-13 DIAGNOSIS — E1165 Type 2 diabetes mellitus with hyperglycemia: Secondary | ICD-10-CM | POA: Diagnosis not present

## 2017-09-13 DIAGNOSIS — D2230 Melanocytic nevi of unspecified part of face: Secondary | ICD-10-CM | POA: Diagnosis not present

## 2017-09-13 DIAGNOSIS — E876 Hypokalemia: Secondary | ICD-10-CM | POA: Diagnosis not present

## 2017-09-13 DIAGNOSIS — I10 Essential (primary) hypertension: Secondary | ICD-10-CM | POA: Diagnosis not present

## 2017-11-01 DIAGNOSIS — E1165 Type 2 diabetes mellitus with hyperglycemia: Secondary | ICD-10-CM | POA: Diagnosis not present

## 2017-11-01 DIAGNOSIS — I10 Essential (primary) hypertension: Secondary | ICD-10-CM | POA: Diagnosis not present

## 2017-11-19 DIAGNOSIS — L821 Other seborrheic keratosis: Secondary | ICD-10-CM | POA: Diagnosis not present

## 2018-02-14 DIAGNOSIS — E119 Type 2 diabetes mellitus without complications: Secondary | ICD-10-CM | POA: Diagnosis not present

## 2018-02-14 DIAGNOSIS — Z1231 Encounter for screening mammogram for malignant neoplasm of breast: Secondary | ICD-10-CM | POA: Diagnosis not present

## 2018-02-14 DIAGNOSIS — Z1211 Encounter for screening for malignant neoplasm of colon: Secondary | ICD-10-CM | POA: Diagnosis not present

## 2018-02-14 DIAGNOSIS — I10 Essential (primary) hypertension: Secondary | ICD-10-CM | POA: Diagnosis not present

## 2018-02-18 DIAGNOSIS — L720 Epidermal cyst: Secondary | ICD-10-CM | POA: Diagnosis not present

## 2018-02-18 DIAGNOSIS — L700 Acne vulgaris: Secondary | ICD-10-CM | POA: Diagnosis not present

## 2018-03-17 ENCOUNTER — Telehealth: Payer: Self-pay | Admitting: Cardiovascular Disease

## 2018-03-17 NOTE — Telephone Encounter (Signed)
Received OV and labs from [facility] on 03/17/18, Appt on 04/06/18 @ 11:15 am, [place]

## 2018-04-06 ENCOUNTER — Ambulatory Visit: Payer: BLUE CROSS/BLUE SHIELD | Admitting: Cardiovascular Disease

## 2018-05-10 ENCOUNTER — Ambulatory Visit (INDEPENDENT_AMBULATORY_CARE_PROVIDER_SITE_OTHER): Payer: BLUE CROSS/BLUE SHIELD | Admitting: Cardiovascular Disease

## 2018-05-10 ENCOUNTER — Encounter: Payer: Self-pay | Admitting: Cardiovascular Disease

## 2018-05-10 VITALS — BP 130/72 | HR 76 | Ht 62.0 in | Wt 140.0 lb

## 2018-05-10 DIAGNOSIS — R011 Cardiac murmur, unspecified: Secondary | ICD-10-CM

## 2018-05-10 DIAGNOSIS — R002 Palpitations: Secondary | ICD-10-CM | POA: Diagnosis not present

## 2018-05-10 NOTE — Assessment & Plan Note (Signed)
History of hyperlipidemia not on statin therapy. 

## 2018-05-10 NOTE — Patient Instructions (Signed)
Medication Instructions:   NO CHANGE  Testing/Procedures:  Your physician has requested that you have an echocardiogram. Echocardiography is a painless test that uses sound waves to create images of your heart. It provides your doctor with information about the size and shape of your heart and how well your heart's chambers and valves are working. This procedure takes approximately one hour. There are no restrictions for this procedure.    Follow-Up:  Your physician recommends that you schedule a follow-up appointment in: AS NEEDED PENDING TEST RESULTS      

## 2018-05-10 NOTE — Assessment & Plan Note (Signed)
Sharon Munoz has a soft outflow tract murmur.  We will check a 2D echocardiogram.

## 2018-05-10 NOTE — Assessment & Plan Note (Signed)
History of essential hypertension her blood pressure measured today 130/72.  She is on atenolol, amlodipine and lisinopril.  Continue current meds at current dosing.

## 2018-05-10 NOTE — Assessment & Plan Note (Signed)
Ms. Sharon Munoz was referred by Dr. Nehemiah SettlePolite for evaluation of palpitations.  This began after starting a diabetes medication which she has since discontinued and the palpitations have resolved spontaneously.  No further work-up is required at this time.

## 2018-05-10 NOTE — Progress Notes (Signed)
05/10/2018 Sharon Munoz   May 21, 1956  [id number]  Primary Physician Sharon Slimmer, MD Primary Cardiologist: Sharon Gess MD Sharon Munoz  HPI:  Sharon Munoz is a 62 y.o. moderately overweight divorced American female mother of 1, grandmother of 2 grandchildren referred by Sharon Munoz for evaluation of palpitations.  She does have a history of treated hypertension and diabetes.  She is a former patient of Sharon Munoz.  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  She is never had a heart attack or stroke.  She denies chest pain or shortness of breath.  Her PCP apparently began her on a diabetes medication which resulted in palpitations.  This has since been discontinued and her palpitations have resolved spontaneously.  She does have a soft outflow tract murmur.   Current Meds  Medication Sig  . amLODipine (NORVASC) 10 MG tablet Take 10 mg by mouth daily. Reported on 12/18/2015  . amoxicillin (AMOXIL) 500 MG capsule Take 2 capsules (1,000 mg total) by mouth 2 (two) times daily.  Marland Kitchen atenolol (TENORMIN) 50 MG tablet Take 50 mg by mouth 2 (two) times daily.  . cetirizine (ZYRTEC) 10 MG chewable tablet Chew 10 mg by mouth 2 (two) times daily.  . clarithromycin (BIAXIN XL) 500 MG 24 hr tablet Take 1 tablet (500 mg total) by mouth 2 (two) times daily.  . diphenhydrAMINE (BENADRYL) 25 MG tablet Take 25 mg by mouth. 1-2 tablets twice daily as needed.  . fluticasone (FLONASE) 50 MCG/ACT nasal spray USE ONE SPRAY IN EACH NOSTRIL ONCE OR TWICE DAILY AS NEEDED FOR STUFFY NOSE OR DRAINAGE  . gabapentin (NEURONTIN) 100 MG capsule Take 3 capsules (300 mg total) by mouth 3 (three) times daily.  Marland Kitchen glimepiride (AMARYL) 4 MG tablet Take 4 mg by mouth daily with breakfast.  . levocetirizine (XYZAL) 5 MG tablet TAKE ONE TABLET ONCE DAILY AS DIRECTED  . lisinopril (PRINIVIL,ZESTRIL) 10 MG tablet Take 10 mg by mouth daily. Reported on 01/29/2016  . metFORMIN (GLUCOPHAGE-XR) 750 MG  24 hr tablet Take 750 mg by mouth 2 (two) times daily.  Marland Kitchen omeprazole (PRILOSEC) 20 MG capsule Take 1 capsule (20 mg total) by mouth daily.  Marland Kitchen omeprazole (PRILOSEC) 20 MG capsule Take 1 capsule (20 mg total) by mouth daily.  . ranitidine (ZANTAC) 150 MG tablet Take 1 tablet (150 mg total) by mouth 2 (two) times daily.     Allergies  Allergen Reactions  . Ace Inhibitors Swelling  . Codeine Nausea And Vomiting    Excessive vomiting  . Lisinopril Hives  . Percocet [Oxycodone-Acetaminophen]     Disoriented, difficulty waking up and hard to focus, vomiting  . Propoxyphene Other (See Comments)    Darvocet (Propoxyphene-Acetaminophen).  Causes Disorientation, difficulty waking up, difficulty focusing, and vomiting.  Alma Friendly [Sitagliptin] Itching and Palpitations    Social History   Socioeconomic History  . Marital status: Legally Separated    Spouse name: Not on file  . Number of children: Not on file  . Years of education: Not on file  . Highest education level: Not on file  Occupational History  . Not on file  Social Needs  . Financial resource strain: Not on file  . Food insecurity:    Worry: Not on file    Inability: Not on file  . Transportation needs:    Medical: Not on file    Non-medical: Not on file  Tobacco Use  . Smoking  status: Never Smoker  . Smokeless tobacco: Never Used  Substance and Sexual Activity  . Alcohol use: No    Alcohol/week: 0.0 oz  . Drug use: Not on file  . Sexual activity: Not on file  Lifestyle  . Physical activity:    Days per week: Not on file    Minutes per session: Not on file  . Stress: Not on file  Relationships  . Social connections:    Talks on phone: Not on file    Gets together: Not on file    Attends religious service: Not on file    Active member of club or organization: Not on file    Attends meetings of clubs or organizations: Not on file    Relationship status: Not on file  . Intimate partner violence:    Fear of  current or ex partner: Not on file    Emotionally abused: Not on file    Physically abused: Not on file    Forced sexual activity: Not on file  Other Topics Concern  . Not on file  Social History Narrative  . Not on file     Review of Systems: General: negative for chills, fever, night sweats or weight changes.  Cardiovascular: negative for chest pain, dyspnea on exertion, edema, orthopnea, palpitations, paroxysmal nocturnal dyspnea or shortness of breath Dermatological: negative for rash Respiratory: negative for cough or wheezing Urologic: negative for hematuria Abdominal: negative for nausea, vomiting, diarrhea, bright red blood per rectum, melena, or hematemesis Neurologic: negative for visual changes, syncope, or dizziness All other systems reviewed and are otherwise negative except as noted above.    Blood pressure 130/72, pulse 76, height 5\' 2"  (1.575 m), weight 140 lb (63.5 kg).  General appearance: alert and no distress Neck: no adenopathy, no carotid bruit, no JVD, supple, symmetrical, trachea midline and thyroid not enlarged, symmetric, no tenderness/mass/nodules Lungs: clear to auscultation bilaterally Heart: regular rate and rhythm, S1, S2 normal, no murmur, click, rub or gallop Extremities: extremities normal, atraumatic, no cyanosis or edema Pulses: 2+ and symmetric Skin: Skin color, texture, turgor normal. No rashes or lesions Neurologic: Alert and oriented X 3, normal strength and tone. Normal symmetric reflexes. Normal coordination and gait  EKG sinus rhythm at 76 minimal criteria for LVH and septal Q waves.  I personally reviewed this EKG.  ASSESSMENT AND PLAN:   Cardiac murmur Sharon Munoz has a soft outflow tract murmur.  We will check a 2D echocardiogram.  HYPERLIPIDEMIA History of hyperlipidemia not on statin therapy.  HYPERTENSION History of essential hypertension her blood pressure measured today 130/72.  She is on atenolol, amlodipine and lisinopril.   Continue current meds at current dosing.  Palpitations Sharon Munoz was referred by Dr. Nehemiah SettlePolite for evaluation of palpitations.  This began after starting a diabetes medication which she has since discontinued and the palpitations have resolved spontaneously.  No further work-up is required at this time.      Sharon GessJonathan J. Dhani Imel MD FACP,FACC,FAHA, [facility] 05/10/2018 10:06 AM

## 2018-05-10 NOTE — Progress Notes (Signed)
ECHO

## 2018-05-12 DIAGNOSIS — Z1211 Encounter for screening for malignant neoplasm of colon: Secondary | ICD-10-CM | POA: Diagnosis not present

## 2018-05-12 DIAGNOSIS — K621 Rectal polyp: Secondary | ICD-10-CM | POA: Diagnosis not present

## 2018-05-12 DIAGNOSIS — K648 Other hemorrhoids: Secondary | ICD-10-CM | POA: Diagnosis not present

## 2018-05-12 DIAGNOSIS — Z800 Family history of malignant neoplasm of digestive organs: Secondary | ICD-10-CM | POA: Diagnosis not present

## 2018-05-17 ENCOUNTER — Ambulatory Visit (HOSPITAL_COMMUNITY): Payer: BLUE CROSS/BLUE SHIELD

## 2018-05-17 DIAGNOSIS — K621 Rectal polyp: Secondary | ICD-10-CM | POA: Diagnosis not present

## 2018-05-17 DIAGNOSIS — Z1211 Encounter for screening for malignant neoplasm of colon: Secondary | ICD-10-CM | POA: Diagnosis not present

## 2018-06-17 ENCOUNTER — Telehealth (HOSPITAL_COMMUNITY): Payer: Self-pay | Admitting: Cardiovascular Disease

## 2018-06-20 NOTE — Telephone Encounter (Signed)
User: Sharon Munoz, Saxon Crosby A Date/time: 06/17/18 9:53 AM  Comment: Called pt and lmsg for her to CB to confirm echo appt on 7/23.  Context:  Outcome: Left Message  Phone number: [phone] Phone Type: Mobile  Comm. type: Telephone Call type: Outgoing  Contact: Shon BatonBrooks, Verma R Relation to patient: Self

## 2018-06-21 ENCOUNTER — Ambulatory Visit (HOSPITAL_COMMUNITY): Payer: BLUE CROSS/BLUE SHIELD | Attending: Cardiovascular Disease

## 2018-06-29 ENCOUNTER — Encounter (HOSPITAL_COMMUNITY): Payer: Self-pay | Admitting: Radiology

## 2018-07-21 DIAGNOSIS — E7800 Pure hypercholesterolemia, unspecified: Secondary | ICD-10-CM | POA: Diagnosis not present

## 2018-07-21 DIAGNOSIS — I10 Essential (primary) hypertension: Secondary | ICD-10-CM | POA: Diagnosis not present

## 2018-07-21 DIAGNOSIS — F321 Major depressive disorder, single episode, moderate: Secondary | ICD-10-CM | POA: Diagnosis not present

## 2018-07-21 DIAGNOSIS — E1169 Type 2 diabetes mellitus with other specified complication: Secondary | ICD-10-CM | POA: Diagnosis not present

## 2018-07-29 ENCOUNTER — Telehealth: Payer: Self-pay | Admitting: Cardiovascular Disease

## 2018-07-29 NOTE — Telephone Encounter (Signed)
Please advise from Dr.Berry. Thank you!

## 2018-07-29 NOTE — Telephone Encounter (Signed)
° ° °  Patient is requesting a name of general practioner from Dr Allyson SabalBerry. Patient is currently not satisfied with her PCP .

## 2018-08-02 NOTE — Telephone Encounter (Signed)
Spoke with pt, medical doctor recommendations given to the patient.

## 2018-08-09 ENCOUNTER — Other Ambulatory Visit: Payer: Self-pay | Admitting: Cardiovascular Disease

## 2018-08-09 DIAGNOSIS — R011 Cardiac murmur, unspecified: Secondary | ICD-10-CM

## 2018-08-11 ENCOUNTER — Ambulatory Visit (HOSPITAL_COMMUNITY): Payer: BLUE CROSS/BLUE SHIELD | Attending: Cardiology

## 2018-08-11 ENCOUNTER — Other Ambulatory Visit: Payer: Self-pay

## 2018-08-11 DIAGNOSIS — R011 Cardiac murmur, unspecified: Secondary | ICD-10-CM | POA: Insufficient documentation

## 2018-08-11 DIAGNOSIS — E119 Type 2 diabetes mellitus without complications: Secondary | ICD-10-CM | POA: Diagnosis not present

## 2018-08-11 DIAGNOSIS — I10 Essential (primary) hypertension: Secondary | ICD-10-CM | POA: Diagnosis not present

## 2018-08-23 DIAGNOSIS — Z0000 Encounter for general adult medical examination without abnormal findings: Secondary | ICD-10-CM | POA: Insufficient documentation

## 2018-08-30 DIAGNOSIS — Z23 Encounter for immunization: Secondary | ICD-10-CM | POA: Diagnosis not present

## 2018-08-30 DIAGNOSIS — F324 Major depressive disorder, single episode, in partial remission: Secondary | ICD-10-CM | POA: Diagnosis not present

## 2018-08-30 DIAGNOSIS — I10 Essential (primary) hypertension: Secondary | ICD-10-CM | POA: Diagnosis not present

## 2018-08-30 DIAGNOSIS — M25531 Pain in right wrist: Secondary | ICD-10-CM | POA: Diagnosis not present

## 2018-08-30 DIAGNOSIS — E1169 Type 2 diabetes mellitus with other specified complication: Secondary | ICD-10-CM | POA: Diagnosis not present

## 2018-09-01 ENCOUNTER — Ambulatory Visit: Payer: BLUE CROSS/BLUE SHIELD

## 2018-09-08 ENCOUNTER — Ambulatory Visit: Payer: BLUE CROSS/BLUE SHIELD

## 2018-09-13 DIAGNOSIS — E119 Type 2 diabetes mellitus without complications: Secondary | ICD-10-CM | POA: Diagnosis not present

## 2018-09-13 DIAGNOSIS — I519 Heart disease, unspecified: Secondary | ICD-10-CM | POA: Diagnosis not present

## 2018-09-13 DIAGNOSIS — Z1389 Encounter for screening for other disorder: Secondary | ICD-10-CM | POA: Diagnosis not present

## 2018-09-13 DIAGNOSIS — I10 Essential (primary) hypertension: Secondary | ICD-10-CM | POA: Diagnosis not present

## 2018-09-13 DIAGNOSIS — G5603 Carpal tunnel syndrome, bilateral upper limbs: Secondary | ICD-10-CM | POA: Diagnosis not present

## 2018-09-15 ENCOUNTER — Ambulatory Visit: Payer: BLUE CROSS/BLUE SHIELD

## 2018-09-27 ENCOUNTER — Ambulatory Visit: Payer: BLUE CROSS/BLUE SHIELD | Admitting: *Deleted

## 2018-09-29 DIAGNOSIS — H5203 Hypermetropia, bilateral: Secondary | ICD-10-CM | POA: Diagnosis not present

## 2018-09-29 DIAGNOSIS — E119 Type 2 diabetes mellitus without complications: Secondary | ICD-10-CM | POA: Diagnosis not present

## 2018-10-14 DIAGNOSIS — Z6825 Body mass index (BMI) 25.0-25.9, adult: Secondary | ICD-10-CM | POA: Diagnosis not present

## 2018-10-14 DIAGNOSIS — R05 Cough: Secondary | ICD-10-CM | POA: Diagnosis not present

## 2018-10-14 DIAGNOSIS — G5601 Carpal tunnel syndrome, right upper limb: Secondary | ICD-10-CM | POA: Diagnosis not present

## 2018-10-14 DIAGNOSIS — J309 Allergic rhinitis, unspecified: Secondary | ICD-10-CM | POA: Diagnosis not present

## 2018-10-18 DIAGNOSIS — Z01419 Encounter for gynecological examination (general) (routine) without abnormal findings: Secondary | ICD-10-CM | POA: Diagnosis not present

## 2018-10-18 DIAGNOSIS — Z6825 Body mass index (BMI) 25.0-25.9, adult: Secondary | ICD-10-CM | POA: Diagnosis not present

## 2018-10-21 DIAGNOSIS — M25511 Pain in right shoulder: Secondary | ICD-10-CM | POA: Diagnosis not present

## 2018-10-21 DIAGNOSIS — G5601 Carpal tunnel syndrome, right upper limb: Secondary | ICD-10-CM | POA: Diagnosis not present

## 2018-10-21 DIAGNOSIS — G5602 Carpal tunnel syndrome, left upper limb: Secondary | ICD-10-CM | POA: Diagnosis not present

## 2018-10-21 DIAGNOSIS — M7541 Impingement syndrome of right shoulder: Secondary | ICD-10-CM | POA: Diagnosis not present

## 2018-10-21 DIAGNOSIS — G5603 Carpal tunnel syndrome, bilateral upper limbs: Secondary | ICD-10-CM | POA: Diagnosis not present

## 2018-11-18 DIAGNOSIS — Z13820 Encounter for screening for osteoporosis: Secondary | ICD-10-CM | POA: Diagnosis not present

## 2018-11-18 DIAGNOSIS — Z1231 Encounter for screening mammogram for malignant neoplasm of breast: Secondary | ICD-10-CM | POA: Diagnosis not present

## 2018-12-02 DIAGNOSIS — G5601 Carpal tunnel syndrome, right upper limb: Secondary | ICD-10-CM | POA: Diagnosis not present

## 2018-12-02 DIAGNOSIS — G5602 Carpal tunnel syndrome, left upper limb: Secondary | ICD-10-CM | POA: Diagnosis not present

## 2019-03-07 DIAGNOSIS — Z0000 Encounter for general adult medical examination without abnormal findings: Secondary | ICD-10-CM | POA: Diagnosis not present

## 2019-03-07 DIAGNOSIS — E119 Type 2 diabetes mellitus without complications: Secondary | ICD-10-CM | POA: Diagnosis not present

## 2019-03-14 DIAGNOSIS — Z0000 Encounter for general adult medical examination without abnormal findings: Secondary | ICD-10-CM | POA: Diagnosis not present

## 2019-03-14 DIAGNOSIS — J309 Allergic rhinitis, unspecified: Secondary | ICD-10-CM | POA: Diagnosis not present

## 2019-03-14 DIAGNOSIS — E1165 Type 2 diabetes mellitus with hyperglycemia: Secondary | ICD-10-CM | POA: Insufficient documentation

## 2019-03-14 DIAGNOSIS — M810 Age-related osteoporosis without current pathological fracture: Secondary | ICD-10-CM | POA: Diagnosis not present

## 2019-03-14 DIAGNOSIS — I10 Essential (primary) hypertension: Secondary | ICD-10-CM | POA: Diagnosis not present

## 2019-04-03 ENCOUNTER — Encounter (HOSPITAL_COMMUNITY): Payer: Self-pay

## 2019-04-03 ENCOUNTER — Other Ambulatory Visit: Payer: Self-pay

## 2019-04-03 ENCOUNTER — Ambulatory Visit (HOSPITAL_COMMUNITY)
Admission: EM | Admit: 2019-04-03 | Discharge: 2019-04-03 | Disposition: A | Discharge status: Home / Self Care | Payer: BLUE CROSS/BLUE SHIELD | Attending: Internal Medicine | Admitting: Internal Medicine

## 2019-04-03 DIAGNOSIS — L720 Epidermal cyst: Secondary | ICD-10-CM | POA: Diagnosis not present

## 2019-04-03 MED ORDER — BACITRACIN ZINC 500 UNIT/GM EX OINT
TOPICAL_OINTMENT | CUTANEOUS | Status: AC
  Filled 2019-04-03: qty 0.9

## 2019-04-03 MED ORDER — MUPIROCIN 2 % EX OINT: 1.0000 "application " | TOPICAL_OINTMENT | Freq: Two times a day (BID) | CUTANEOUS | 0 refills | Status: AC

## 2019-04-03 MED ORDER — CEPHALEXIN 500 MG PO CAPS: 500.0000 mg | ORAL_CAPSULE | Freq: Two times a day (BID) | ORAL | 0 refills | Status: DC

## 2019-04-03 NOTE — ED Provider Notes (Signed)
[facility]    CSN: [id number] Arrival date & time: 04/03/19  1848     History   Chief Complaint Chief Complaint  Patient presents with  . cyst on her forehead    HPI Sharon Munoz is a 63 y.o. female.   She presents today with a sore red bump on her left forehead, started about 2 days ago.  This is draining scant material.  Today, she felt like there was a little bit of redness and puffiness extending down towards her left eye.  No fever, no malaise.  Does have a history of diabetes.    HPI  Past Medical History:  Diagnosis Date  . Diabetes mellitus   . Hypertension     Patient Active Problem List   Diagnosis Date Noted  . Palpitations 05/10/2018  . Bilateral hand pain 02/12/2016  . H. pylori infection 01/29/2016  . Chronic urticaria 12/10/2015  . Angioedema 12/10/2015  . Chronic rhinitis 12/10/2015  . WRIST PAIN, RIGHT 03/05/2009  . HYPOMAGNESEMIA 10/05/2008  . CATARACTS, BILATERAL 08/24/2008  . Cardiac murmur 08/24/2008  . CARPAL TUNNEL SYNDROME, BILATERAL 08/23/2008  . HYPERLIPIDEMIA 10/20/2007  . HYPOKALEMIA 10/20/2007  . OTHER ACUTE REACTIONS TO STRESS 10/04/2007  . GERD 06/07/2007  . POSTMENOPAUSAL STATUS 11/30/2006  . Diabetes mellitus (HCC) 11/30/2005  . HYPERTENSION 11/30/2002    History reviewed. No pertinent surgical history.   Home Medications    Prior to Admission medications   Medication Sig Start Date End Date Taking? Authorizing Provider  amLODipine (NORVASC) 10 MG tablet Take 10 mg by mouth daily. Reported on 12/18/2015    [provider]  amoxicillin (AMOXIL) 500 MG capsule Take 2 capsules (1,000 mg total) by mouth 2 (two) times daily. 01/29/16   Bobbitt, Heywood Iles, MD  atenolol (TENORMIN) 50 MG tablet Take 50 mg by mouth 2 (two) times daily.    [provider]  cephALEXin (KEFLEX) 500 MG capsule Take 1 capsule (500 mg total) by mouth 2 (two) times daily. 04/03/19   Isa Rankin, MD  cetirizine  (ZYRTEC) 10 MG chewable tablet Chew 10 mg by mouth 2 (two) times daily.    [provider]  clarithromycin (BIAXIN XL) 500 MG 24 hr tablet Take 1 tablet (500 mg total) by mouth 2 (two) times daily. 01/29/16   Bobbitt, Heywood Iles, MD  diphenhydrAMINE (BENADRYL) 25 MG tablet Take 25 mg by mouth. 1-2 tablets twice daily as needed.    [provider]  fluticasone (FLONASE) 50 MCG/ACT nasal spray USE ONE SPRAY IN EACH NOSTRIL ONCE OR TWICE DAILY AS NEEDED FOR STUFFY NOSE OR DRAINAGE 12/10/15   Bobbitt, Heywood Iles, MD  gabapentin (NEURONTIN) 100 MG capsule Take 3 capsules (300 mg total) by mouth 3 (three) times daily. 02/12/16   Levert Feinstein, MD  glimepiride (AMARYL) 4 MG tablet Take 4 mg by mouth daily with breakfast.    [provider]  levocetirizine (XYZAL) 5 MG tablet TAKE ONE TABLET ONCE DAILY AS DIRECTED 09/28/16   Bobbitt, Heywood Iles, MD  lisinopril (PRINIVIL,ZESTRIL) 10 MG tablet Take 10 mg by mouth daily. Reported on 01/29/2016    [provider]  metFORMIN (GLUCOPHAGE-XR) 750 MG 24 hr tablet Take 750 mg by mouth 2 (two) times daily.    [provider]  mupirocin ointment (BACTROBAN) 2 % Apply 1 application topically 2 (two) times daily for 7 days. 04/03/19 04/10/19  Isa Rankin, MD  omeprazole (PRILOSEC) 20 MG capsule Take 1 capsule (20 mg  total) by mouth 2 (two) times daily before a meal. 12/24/15 01/07/16  Bobbitt, Heywood Ilesalph Carter, MD  omeprazole (PRILOSEC) 20 MG capsule Take 1 capsule (20 mg total) by mouth daily. 01/09/16   Bobbitt, Heywood Ilesalph Carter, MD  omeprazole (PRILOSEC) 20 MG capsule Take 1 capsule (20 mg total) by mouth daily. 01/29/16   Bobbitt, Heywood Ilesalph Carter, MD  ranitidine (ZANTAC) 150 MG tablet Take 1 tablet (150 mg total) by mouth 2 (two) times daily. 12/18/15   Bobbitt, Heywood Ilesalph Carter, MD    Family History Family History  Problem Relation Age of Onset  . Asthma Father   . Hypertension Father   . Kidney cancer Father   . Asthma Brother   .  Hypertension Mother   . Eczema Neg Hx   . Immunodeficiency Neg Hx   . Urticaria Neg Hx     Social History Social History   Tobacco Use  . Smoking status: Never Smoker  . Smokeless tobacco: Never Used  Substance Use Topics  . Alcohol use: No    Alcohol/week: 0.0 standard drinks  . Drug use: Not on file     Allergies   Ace inhibitors; Codeine; Lisinopril; Percocet [oxycodone-acetaminophen]; Propoxyphene; and Januvia [sitagliptin]   Review of Systems Review of Systems  All other systems reviewed and are negative.    Physical Exam Triage Vital Signs ED Triage Vitals [04/03/19 1903]  Enc Vitals Group     BP (!) 164/103     Pulse Rate 96     Resp 18     Temp 98.1 F (36.7 C)     Temp Source Oral     SpO2 98 %     Weight 142 lb (64.4 kg)     Height      Pain Score 6     Pain Loc    Updated Vital Signs BP (!) 164/103 (BP Location: Right Arm)   Pulse 96   Temp 98.1 F (36.7 C) (Oral)   Resp 18   Wt 64.4 kg   SpO2 98%   BMI 25.97 kg/m  Physical Exam Vitals signs and nursing note reviewed.  Constitutional:      General: She is not in acute distress.    Comments: Alert, nicely groomed  HENT:     Head: Atraumatic.  Eyes:     Comments: Conjugate gaze, no eye redness/drainage  Neck:     Musculoskeletal: Neck supple.  Cardiovascular:     Rate and Rhythm: Normal rate.  Pulmonary:     Effort: No respiratory distress.  Abdominal:     General: There is no distension.  Musculoskeletal: Normal range of motion.     Comments: No leg swelling  Skin:    General: Skin is warm and dry.     Comments: No cyanosis 1.5 inch elevated tender cystic lesion with a central necrotic area and some underlying fluctuance.  Little bit red.  Quite tender.  Minimal puffiness/erythema of the left upper lid.  Neurological:     Mental Status: She is alert and oriented to person, place, and time.      UC Treatments / Results   Procedures Incision and Drainage Date/Time:  04/03/2019 7:48 PM Performed by: Isa RankinMurray, Leenah Seidner Wilson, MD Authorized by: Isa RankinMurray, Adia Crammer Wilson, MD   Consent:    Consent obtained:  Verbal   Consent given by:  Patient Location:    Type:  Cyst   Size:  1.5"   Location:  Head   Head/neck location: left forehead. Pre-procedure details:  Skin preparation:  Hibiclens Anesthesia (see MAR for exact dosages):    Anesthesia method: cryospray. Procedure type:    Complexity:  Simple Procedure details:    Scalpel blade:  11   Techniques: debrided central necrotic area, removed scab and expressed hard/cystic center with scant amount of purulent debris.     Drainage amount:  Scant   Wound treatment:  Wound left open (antibiotic ointment and bandaid applied.)   Final Clinical Impressions(s) / UC Diagnoses   Final diagnoses:  Cyst of skin and subcutaneous tissue     Discharge Instructions     Bump on forehead seems most consistent with an inflamed cyst.  This bump was drained today at the urgent care.  Prescription for cephalexin (antibiotic) and an antibiotic ointment were sent to the pharmacy.  Anticipate gradual improvement in pain/swelling/drainage over the next several days.  Wash sore gently with soap/water 1-2 times daily, apply antibiotic ointment and bandage.  Recheck if any increasing redness/swelling/pain/drainage or new fever>100.5, or if not starting to improve in a few days.     ED Prescriptions    Medication Sig Dispense Auth. Provider   cephALEXin (KEFLEX) 500 MG capsule Take 1 capsule (500 mg total) by mouth 2 (two) times daily. 20 capsule Isa Rankin, MD   mupirocin ointment (BACTROBAN) 2 % Apply 1 application topically 2 (two) times daily for 7 days. 22 g Isa Rankin, MD        Isa Rankin, MD 04/03/19 2029

## 2019-04-03 NOTE — ED Triage Notes (Signed)
Pt has a cyst on her forehead x 3 days. Pt states its been draining.

## 2019-04-03 NOTE — Discharge Instructions (Signed)
Bump on forehead seems most consistent with an inflamed cyst.  This bump was drained today at the urgent care.  Prescription for cephalexin (antibiotic) and an antibiotic ointment were sent to the pharmacy.  Anticipate gradual improvement in pain/swelling/drainage over the next several days.  Wash sore gently with soap/water 1-2 times daily, apply antibiotic ointment and bandage.  Recheck if any increasing redness/swelling/pain/drainage or new fever>100.5, or if not starting to improve in a few days.

## 2019-04-05 DIAGNOSIS — I10 Essential (primary) hypertension: Secondary | ICD-10-CM | POA: Diagnosis not present

## 2019-04-05 DIAGNOSIS — E1165 Type 2 diabetes mellitus with hyperglycemia: Secondary | ICD-10-CM | POA: Diagnosis not present

## 2019-04-06 DIAGNOSIS — M8580 Other specified disorders of bone density and structure, unspecified site: Secondary | ICD-10-CM | POA: Insufficient documentation

## 2019-04-06 DIAGNOSIS — M810 Age-related osteoporosis without current pathological fracture: Secondary | ICD-10-CM | POA: Insufficient documentation

## 2019-05-09 DIAGNOSIS — Z0271 Encounter for disability determination: Secondary | ICD-10-CM

## 2019-08-09 DIAGNOSIS — I10 Essential (primary) hypertension: Secondary | ICD-10-CM | POA: Diagnosis not present

## 2019-08-09 DIAGNOSIS — E1165 Type 2 diabetes mellitus with hyperglycemia: Secondary | ICD-10-CM | POA: Diagnosis not present

## 2019-08-09 DIAGNOSIS — L729 Follicular cyst of the skin and subcutaneous tissue, unspecified: Secondary | ICD-10-CM | POA: Diagnosis not present

## 2019-08-09 DIAGNOSIS — Z91148 Patient's other noncompliance with medication regimen for other reason: Secondary | ICD-10-CM | POA: Insufficient documentation

## 2019-08-09 DIAGNOSIS — Z9114 Patient's other noncompliance with medication regimen: Secondary | ICD-10-CM | POA: Diagnosis not present

## 2019-09-14 DIAGNOSIS — I519 Heart disease, unspecified: Secondary | ICD-10-CM | POA: Diagnosis not present

## 2019-09-14 DIAGNOSIS — M810 Age-related osteoporosis without current pathological fracture: Secondary | ICD-10-CM | POA: Diagnosis not present

## 2019-09-14 DIAGNOSIS — E1165 Type 2 diabetes mellitus with hyperglycemia: Secondary | ICD-10-CM | POA: Diagnosis not present

## 2019-09-14 DIAGNOSIS — I10 Essential (primary) hypertension: Secondary | ICD-10-CM | POA: Diagnosis not present

## 2019-09-14 DIAGNOSIS — Z9114 Patient's other noncompliance with medication regimen: Secondary | ICD-10-CM | POA: Diagnosis not present

## 2019-09-19 ENCOUNTER — Other Ambulatory Visit: Payer: Self-pay

## 2019-09-19 ENCOUNTER — Encounter (HOSPITAL_COMMUNITY): Payer: Self-pay

## 2019-09-19 ENCOUNTER — Ambulatory Visit (HOSPITAL_COMMUNITY)
Admission: EM | Admit: 2019-09-19 | Discharge: 2019-09-19 | Disposition: A | Discharge status: Home / Self Care | Payer: BLUE CROSS/BLUE SHIELD | Attending: Emergency Medicine | Admitting: Emergency Medicine

## 2019-09-19 DIAGNOSIS — M62838 Other muscle spasm: Secondary | ICD-10-CM

## 2019-09-19 DIAGNOSIS — I10 Essential (primary) hypertension: Secondary | ICD-10-CM

## 2019-09-19 MED ORDER — METHOCARBAMOL 500 MG PO TABS: 500.0000 mg | ORAL_TABLET | Freq: Two times a day (BID) | ORAL | 0 refills | Status: DC

## 2019-09-19 NOTE — ED Provider Notes (Signed)
[facility]    CSN: [id number] Arrival date & time: 09/19/19  1931      History   Chief Complaint Chief Complaint  Patient presents with  . Back Pain    HPI Sharon Munoz is a 63 y.o. female.   Patient presents with intermittent muscle spasms in her right mid back since she woke up this morning.  She denies fall or injury but believes she may have strained her muscles.  She denies numbness, weakness, paresthesias, or other symptoms.  She took Tylenol for her pain and reports good relief with this.  She reports a remote history of muscle spasms in her back many years ago but none since.     The history is provided by the patient.    Past Medical History:  Diagnosis Date  . Diabetes mellitus   . Hypertension     Patient Active Problem List   Diagnosis Date Noted  . Palpitations 05/10/2018  . Bilateral hand pain 02/12/2016  . H. pylori infection 01/29/2016  . Chronic urticaria 12/10/2015  . Angioedema 12/10/2015  . Chronic rhinitis 12/10/2015  . WRIST PAIN, RIGHT 03/05/2009  . HYPOMAGNESEMIA 10/05/2008  . CATARACTS, BILATERAL 08/24/2008  . Cardiac murmur 08/24/2008  . CARPAL TUNNEL SYNDROME, BILATERAL 08/23/2008  . HYPERLIPIDEMIA 10/20/2007  . HYPOKALEMIA 10/20/2007  . OTHER ACUTE REACTIONS TO STRESS 10/04/2007  . GERD 06/07/2007  . POSTMENOPAUSAL STATUS 11/30/2006  . Diabetes mellitus ([place]) 11/30/2005  . HYPERTENSION 11/30/2002    History reviewed. No pertinent surgical history.  OB History   No obstetric history on file.      Home Medications    Prior to Admission medications   Medication Sig Start Date End Date Taking? Authorizing Provider  amLODipine (NORVASC) 10 MG tablet Take 10 mg by mouth daily. Reported on 12/18/2015    [provider]  amoxicillin (AMOXIL) 500 MG capsule Take 2 capsules (1,000 mg total) by mouth 2 (two) times daily. 01/29/16   Bobbitt, Sedalia Muta, MD  atenolol (TENORMIN) 50 MG tablet Take 50 mg by mouth 2  (two) times daily.    [provider]  cephALEXin (KEFLEX) 500 MG capsule Take 1 capsule (500 mg total) by mouth 2 (two) times daily. 04/03/19   Wynona Luna, MD  cetirizine (ZYRTEC) 10 MG chewable tablet Chew 10 mg by mouth 2 (two) times daily.    [provider]  clarithromycin (BIAXIN XL) 500 MG 24 hr tablet Take 1 tablet (500 mg total) by mouth 2 (two) times daily. 01/29/16   Bobbitt, Sedalia Muta, MD  diphenhydrAMINE (BENADRYL) 25 MG tablet Take 25 mg by mouth. 1-2 tablets twice daily as needed.    [provider]  fluticasone (FLONASE) 50 MCG/ACT nasal spray USE ONE SPRAY IN EACH NOSTRIL ONCE OR TWICE DAILY AS NEEDED FOR STUFFY NOSE OR DRAINAGE 12/10/15   Bobbitt, Sedalia Muta, MD  gabapentin (NEURONTIN) 100 MG capsule Take 3 capsules (300 mg total) by mouth 3 (three) times daily. 02/12/16   Marcial Pacas, MD  glimepiride (AMARYL) 4 MG tablet Take 4 mg by mouth daily with breakfast.    [provider]  levocetirizine (XYZAL) 5 MG tablet TAKE ONE TABLET ONCE DAILY AS DIRECTED 09/28/16   Bobbitt, Sedalia Muta, MD  lisinopril (PRINIVIL,ZESTRIL) 10 MG tablet Take 10 mg by mouth daily. Reported on 01/29/2016    [provider]  metFORMIN (GLUCOPHAGE-XR) 750 MG 24 hr tablet Take 750 mg by mouth 2 (two) times daily.    [provider]  methocarbamol (ROBAXIN) 500 MG tablet Take 1 tablet (500 mg total) by mouth 2 (two) times daily. 09/19/19   Mickie Bailate, Shaelee Forni H, NP  omeprazole (PRILOSEC) 20 MG capsule Take 1 capsule (20 mg total) by mouth 2 (two) times daily before a meal. 12/24/15 01/07/16  Bobbitt, Heywood Ilesalph Carter, MD  omeprazole (PRILOSEC) 20 MG capsule Take 1 capsule (20 mg total) by mouth daily. 01/09/16   Bobbitt, Heywood Ilesalph Carter, MD  omeprazole (PRILOSEC) 20 MG capsule Take 1 capsule (20 mg total) by mouth daily. 01/29/16   Bobbitt, Heywood Ilesalph Carter, MD  ranitidine (ZANTAC) 150 MG tablet Take 1 tablet (150 mg total) by mouth 2 (two) times daily. 12/18/15   Bobbitt,  Heywood Ilesalph Carter, MD    Family History Family History  Problem Relation Age of Onset  . Asthma Father   . Hypertension Father   . Kidney cancer Father   . Asthma Brother   . Hypertension Mother   . Eczema Neg Hx   . Immunodeficiency Neg Hx   . Urticaria Neg Hx     Social History Social History   Tobacco Use  . Smoking status: Never Smoker  . Smokeless tobacco: Never Used  Substance Use Topics  . Alcohol use: No    Alcohol/week: 0.0 standard drinks  . Drug use: Not on file     Allergies   Ace inhibitors, Codeine, Lisinopril, Percocet [oxycodone-acetaminophen], Propoxyphene, and Januvia [sitagliptin]   Review of Systems Review of Systems  Constitutional: Negative for chills and fever.  HENT: Negative for ear pain and sore throat.   Eyes: Negative for pain and visual disturbance.  Respiratory: Negative for cough and shortness of breath.   Cardiovascular: Negative for chest pain and palpitations.  Gastrointestinal: Negative for abdominal pain and vomiting.  Genitourinary: Negative for dysuria and hematuria.  Musculoskeletal: Positive for back pain. Negative for arthralgias.  Skin: Negative for color change and rash.  Neurological: Negative for seizures, syncope, weakness and numbness.  All other systems reviewed and are negative.    Physical Exam Triage Vital Signs ED Triage Vitals  Enc Vitals Group     BP      Pulse      Resp      Temp      Temp src      SpO2      Weight      Height      Head Circumference      Peak Flow      Pain Score      Pain Loc      Pain Edu?      Excl. in GC?    No data found.  Updated Vital Signs BP (!) 159/88 (BP Location: Left Arm)   Pulse 89   Temp 97.7 F (36.5 C) (Tympanic)   Resp 17   SpO2 96%   Visual Acuity Right Eye Distance:   Left Eye Distance:   Bilateral Distance:    Right Eye Near:   Left Eye Near:    Bilateral Near:     Physical Exam Vitals signs and nursing note reviewed.  Constitutional:       General: She is not in acute distress.    Appearance: She is well-developed.  HENT:     Head: Normocephalic and atraumatic.     Mouth/Throat:     Mouth: Mucous membranes are moist.     Pharynx: Oropharynx is clear.  Eyes:     Conjunctiva/sclera: Conjunctivae normal.  Neck:  Musculoskeletal: Neck supple.  Cardiovascular:     Rate and Rhythm: Normal rate and regular rhythm.     Heart sounds: No murmur.  Pulmonary:     Effort: Pulmonary effort is normal. No respiratory distress.     Breath sounds: Normal breath sounds.  Abdominal:     Palpations: Abdomen is soft.     Tenderness: There is no abdominal tenderness. There is no right CVA tenderness, left CVA tenderness, guarding or rebound.  Musculoskeletal: Normal range of motion.        General: No swelling, tenderness or deformity.  Skin:    General: Skin is warm and dry.     Capillary Refill: Capillary refill takes less than 2 seconds.     Findings: No bruising, erythema, lesion or rash.  Neurological:     General: No focal deficit present.     Mental Status: She is alert and oriented to person, place, and time.     Sensory: No sensory deficit.     Motor: No weakness.     Gait: Gait normal.  Psychiatric:        Behavior: Behavior normal.      UC Treatments / Results  Labs (all labs ordered are listed, but only abnormal results are displayed) Labs Reviewed - No data to display  EKG   Radiology No results found.  Procedures Procedures (including critical care time)  Medications Ordered in UC Medications - No data to display  Initial Impression / Assessment and Plan / UC Course  I have reviewed the triage vital signs and the nursing notes.  Pertinent labs & imaging results that were available during my care of the patient were reviewed by me and considered in my medical decision making (see chart for details).    Muscle spasm.  Elevated blood pressure with known hypertension.  Treating with Robaxin.   Precautions for drowsiness with this medication discussed with patient.  Instructed her to continue taking Tylenol as needed for her discomfort.  Discussed that she should follow-up with her PCP or come back here if her symptoms are not improving or get worse.  Discussed with patient that her blood pressure is elevated today and that she should have this rechecked by her PCP in 2 to 4 weeks.  Patient agrees to plan of care.     Final Clinical Impressions(s) / UC Diagnoses   Final diagnoses:  Elevated blood pressure reading in office with diagnosis of hypertension  Muscle spasm     Discharge Instructions     Continue to take the Tylenol as needed for discomfort.  Additionally you can take the prescribed muscle relaxer as needed for muscle spasm.  Do not drive, operate machinery, or drink alcohol with this medication as it may cause drowsiness.    Follow-up with your primary care provider or return here if your symptoms are not improving or if they get worse.  Your blood pressure is elevated today at 159/88.  Please have this rechecked by your primary care provider in 2-4 weeks.        ED Prescriptions    Medication Sig Dispense Auth. Provider   methocarbamol (ROBAXIN) 500 MG tablet Take 1 tablet (500 mg total) by mouth 2 (two) times daily. 20 tablet Mickie Bail, NP     I have reviewed the PDMP during this encounter.   Mickie Bail, NP 09/19/19 2022

## 2019-09-19 NOTE — Discharge Instructions (Addendum)
Continue to take the Tylenol as needed for discomfort.  Additionally you can take the prescribed muscle relaxer as needed for muscle spasm.  Do not drive, operate machinery, or drink alcohol with this medication as it may cause drowsiness.    Follow-up with your primary care provider or return here if your symptoms are not improving or if they get worse.  Your blood pressure is elevated today at 159/88.  Please have this rechecked by your primary care provider in 2-4 weeks.

## 2019-09-19 NOTE — ED Triage Notes (Signed)
Pt presents with recurrent muscle spasms in middle and lower back today.

## 2019-09-21 DIAGNOSIS — M62830 Muscle spasm of back: Secondary | ICD-10-CM | POA: Diagnosis not present

## 2019-09-21 DIAGNOSIS — I10 Essential (primary) hypertension: Secondary | ICD-10-CM | POA: Diagnosis not present

## 2019-11-09 DIAGNOSIS — E1165 Type 2 diabetes mellitus with hyperglycemia: Secondary | ICD-10-CM | POA: Diagnosis not present

## 2019-11-09 DIAGNOSIS — Z9114 Patient's other noncompliance with medication regimen: Secondary | ICD-10-CM | POA: Diagnosis not present

## 2019-11-09 DIAGNOSIS — M62830 Muscle spasm of back: Secondary | ICD-10-CM | POA: Diagnosis not present

## 2019-11-09 DIAGNOSIS — I10 Essential (primary) hypertension: Secondary | ICD-10-CM | POA: Diagnosis not present

## 2020-02-02 ENCOUNTER — Ambulatory Visit: Payer: BLUE CROSS/BLUE SHIELD

## 2020-02-02 ENCOUNTER — Ambulatory Visit: Payer: BLUE CROSS/BLUE SHIELD | Attending: Internal Medicine

## 2020-02-02 DIAGNOSIS — Z23 Encounter for immunization: Secondary | ICD-10-CM | POA: Insufficient documentation

## 2020-02-28 ENCOUNTER — Ambulatory Visit: Payer: BLUE CROSS/BLUE SHIELD

## 2020-03-07 ENCOUNTER — Other Ambulatory Visit: Payer: Self-pay

## 2020-03-07 ENCOUNTER — Inpatient Hospital Stay (HOSPITAL_COMMUNITY)
Admission: EM | Admit: 2020-03-07 | Discharge: 2020-03-09 | DRG: 287 | Disposition: A | Discharge status: Home / Self Care | Payer: BLUE CROSS/BLUE SHIELD | Source: Ambulatory Visit | Attending: Family Medicine | Admitting: Family Medicine

## 2020-03-07 ENCOUNTER — Emergency Department (HOSPITAL_COMMUNITY): Payer: BLUE CROSS/BLUE SHIELD

## 2020-03-07 ENCOUNTER — Encounter (HOSPITAL_COMMUNITY): Payer: Self-pay

## 2020-03-07 ENCOUNTER — Ambulatory Visit (HOSPITAL_COMMUNITY)
Admission: EM | Admit: 2020-03-07 | Discharge: 2020-03-07 | Disposition: A | Discharge status: Home / Self Care | Payer: BLUE CROSS/BLUE SHIELD | Attending: Family Medicine | Admitting: Family Medicine

## 2020-03-07 DIAGNOSIS — Z885 Allergy status to narcotic agent status: Secondary | ICD-10-CM

## 2020-03-07 DIAGNOSIS — R000 Tachycardia, unspecified: Secondary | ICD-10-CM | POA: Diagnosis present

## 2020-03-07 DIAGNOSIS — E119 Type 2 diabetes mellitus without complications: Secondary | ICD-10-CM | POA: Diagnosis not present

## 2020-03-07 DIAGNOSIS — R002 Palpitations: Secondary | ICD-10-CM

## 2020-03-07 DIAGNOSIS — Z79899 Other long term (current) drug therapy: Secondary | ICD-10-CM

## 2020-03-07 DIAGNOSIS — R7989 Other specified abnormal findings of blood chemistry: Secondary | ICD-10-CM | POA: Diagnosis not present

## 2020-03-07 DIAGNOSIS — R079 Chest pain, unspecified: Secondary | ICD-10-CM | POA: Diagnosis present

## 2020-03-07 DIAGNOSIS — Z825 Family history of asthma and other chronic lower respiratory diseases: Secondary | ICD-10-CM

## 2020-03-07 DIAGNOSIS — E785 Hyperlipidemia, unspecified: Secondary | ICD-10-CM | POA: Diagnosis present

## 2020-03-07 DIAGNOSIS — Z8249 Family history of ischemic heart disease and other diseases of the circulatory system: Secondary | ICD-10-CM

## 2020-03-07 DIAGNOSIS — I5181 Takotsubo syndrome: Principal | ICD-10-CM | POA: Diagnosis present

## 2020-03-07 DIAGNOSIS — Z888 Allergy status to other drugs, medicaments and biological substances status: Secondary | ICD-10-CM

## 2020-03-07 DIAGNOSIS — Z8051 Family history of malignant neoplasm of kidney: Secondary | ICD-10-CM

## 2020-03-07 DIAGNOSIS — M546 Pain in thoracic spine: Secondary | ICD-10-CM

## 2020-03-07 DIAGNOSIS — R778 Other specified abnormalities of plasma proteins: Secondary | ICD-10-CM | POA: Diagnosis not present

## 2020-03-07 DIAGNOSIS — M62830 Muscle spasm of back: Secondary | ICD-10-CM | POA: Diagnosis present

## 2020-03-07 DIAGNOSIS — Z20822 Contact with and (suspected) exposure to covid-19: Secondary | ICD-10-CM | POA: Diagnosis present

## 2020-03-07 DIAGNOSIS — K219 Gastro-esophageal reflux disease without esophagitis: Secondary | ICD-10-CM | POA: Diagnosis present

## 2020-03-07 DIAGNOSIS — I10 Essential (primary) hypertension: Secondary | ICD-10-CM | POA: Diagnosis present

## 2020-03-07 DIAGNOSIS — E7800 Pure hypercholesterolemia, unspecified: Secondary | ICD-10-CM | POA: Diagnosis present

## 2020-03-07 DIAGNOSIS — Z7984 Long term (current) use of oral hypoglycemic drugs: Secondary | ICD-10-CM

## 2020-03-07 HISTORY — DX: Muscle spasm of back: M62.830

## 2020-03-07 LAB — BASIC METABOLIC PANEL
Anion gap: 19 — ABNORMAL HIGH (ref 5–15)
BUN: 11 mg/dL (ref 8–23)
CO2: 21 mmol/L — ABNORMAL LOW (ref 22–32)
Calcium: 10.2 mg/dL (ref 8.9–10.3)
Chloride: 101 mmol/L (ref 98–111)
Creatinine, Ser: 0.74 mg/dL (ref 0.44–1.00)
GFR calc Af Amer: >60 mL/min (ref >60)
GFR calc non Af Amer: >60 mL/min (ref >60)
Glucose, Bld: 218 mg/dL — ABNORMAL HIGH (ref 70–99)
Potassium: 3.3 mmol/L — ABNORMAL LOW (ref 3.5–5.1)
Sodium: 141 mmol/L (ref 135–145)

## 2020-03-07 LAB — CBC
HCT: 44.6 % (ref 36.0–46.0)
Hemoglobin: 14.3 g/dL (ref 12.0–15.0)
MCH: 29.4 pg (ref 26.0–34.0)
MCHC: 32.1 g/dL (ref 30.0–36.0)
MCV: 91.6 fL (ref 80.0–100.0)
Platelets: 348 10*3/uL (ref 150–400)
RBC: 4.87 MIL/uL (ref 3.87–5.11)
RDW: 13.8 % (ref 11.5–15.5)
WBC: 10 10*3/uL (ref 4.0–10.5)
nRBC: 0 % (ref 0.0–0.2)

## 2020-03-07 LAB — GLUCOSE, CAPILLARY: Glucose-Capillary: 191 mg/dL — ABNORMAL HIGH (ref 70–99)

## 2020-03-07 LAB — CBG MONITORING, ED: Glucose-Capillary: 191 mg/dL — ABNORMAL HIGH (ref 70–99)

## 2020-03-07 LAB — TROPONIN I (HIGH SENSITIVITY): Troponin I (High Sensitivity): 5 ng/L (ref <18)

## 2020-03-07 MED ORDER — SODIUM CHLORIDE 0.9% FLUSH: 3.0000 mL | Freq: Once | INTRAVENOUS | Status: DC

## 2020-03-07 NOTE — ED Provider Notes (Signed)
[place]    CSN: [id number] Arrival date & time: 03/07/20  1944      History   Chief Complaint Chief Complaint  Patient presents with  . Back Pain    HPI Sharon Munoz is a 64 y.o. female history of hypertension, DM type II, presenting today for evaluation of back pain.  Patient states that over the past 1 to 2 days she has had pain in her right mid back.  At times will spasm up and intensify.  Symptoms will ease after a couple of seconds.  Pain does take breath away, but otherwise denies shortness of breath.  Denies chest pain.  Denies headaches or vision changes.  She does note that over the past week she has had some intermittent migratory swelling, hives and itching.  Most notable to hands.  Saw PCP and thought possible allergic reaction to recent Covid vaccine.  Has been using antihistamines.  Denies prior DVT or PE.  Denies leg pain or leg swelling.  When discussing elevated heart rate patient does report that she rushed over to the urgent care, but heart rate not improving with rest.  HPI  Past Medical History:  Diagnosis Date  . Diabetes mellitus   . Hypertension     Patient Active Problem List   Diagnosis Date Noted  . Palpitations 05/10/2018  . Bilateral hand pain 02/12/2016  . H. pylori infection 01/29/2016  . Chronic urticaria 12/10/2015  . Angioedema 12/10/2015  . Chronic rhinitis 12/10/2015  . WRIST PAIN, RIGHT 03/05/2009  . HYPOMAGNESEMIA 10/05/2008  . CATARACTS, BILATERAL 08/24/2008  . Cardiac murmur 08/24/2008  . CARPAL TUNNEL SYNDROME, BILATERAL 08/23/2008  . HYPERLIPIDEMIA 10/20/2007  . HYPOKALEMIA 10/20/2007  . OTHER ACUTE REACTIONS TO STRESS 10/04/2007  . GERD 06/07/2007  . POSTMENOPAUSAL STATUS 11/30/2006  . Diabetes mellitus ([place]) 11/30/2005  . HYPERTENSION 11/30/2002    History reviewed. No pertinent surgical history.  OB History   No obstetric history on file.      Home Medications    Prior to Admission medications    Medication Sig Start Date End Date Taking? Authorizing Provider  amLODipine (NORVASC) 10 MG tablet Take 10 mg by mouth daily. Reported on 12/18/2015    [provider]  atenolol (TENORMIN) 50 MG tablet Take 50 mg by mouth 2 (two) times daily.    [provider]  cetirizine (ZYRTEC) 10 MG chewable tablet Chew 10 mg by mouth 2 (two) times daily.    [provider]  clarithromycin (BIAXIN XL) 500 MG 24 hr tablet Take 1 tablet (500 mg total) by mouth 2 (two) times daily. 01/29/16   Bobbitt, Sedalia Muta, MD  diphenhydrAMINE (BENADRYL) 25 MG tablet Take 25 mg by mouth. 1-2 tablets twice daily as needed.    [provider]  fluticasone (FLONASE) 50 MCG/ACT nasal spray USE ONE SPRAY IN EACH NOSTRIL ONCE OR TWICE DAILY AS NEEDED FOR STUFFY NOSE OR DRAINAGE 12/10/15   Bobbitt, Sedalia Muta, MD  gabapentin (NEURONTIN) 100 MG capsule Take 3 capsules (300 mg total) by mouth 3 (three) times daily. 02/12/16   Marcial Pacas, MD  glimepiride (AMARYL) 4 MG tablet Take 4 mg by mouth daily with breakfast.    [provider]  levocetirizine (XYZAL) 5 MG tablet TAKE ONE TABLET ONCE DAILY AS DIRECTED 09/28/16   Bobbitt, Sedalia Muta, MD  lisinopril (PRINIVIL,ZESTRIL) 10 MG tablet Take 10 mg by mouth daily. Reported on 01/29/2016    [provider]  metFORMIN (GLUCOPHAGE-XR) 750 MG  24 hr tablet Take 750 mg by mouth 2 (two) times daily.    [provider]  methocarbamol (ROBAXIN) 500 MG tablet Take 1 tablet (500 mg total) by mouth 2 (two) times daily. 09/19/19   Mickie Bail, NP  omeprazole (PRILOSEC) 20 MG capsule Take 1 capsule (20 mg total) by mouth 2 (two) times daily before a meal. 12/24/15 01/07/16  Bobbitt, Heywood Iles, MD  omeprazole (PRILOSEC) 20 MG capsule Take 1 capsule (20 mg total) by mouth daily. 01/09/16   Bobbitt, Heywood Iles, MD  omeprazole (PRILOSEC) 20 MG capsule Take 1 capsule (20 mg total) by mouth daily. 01/29/16   Bobbitt, Heywood Iles, MD  ranitidine  (ZANTAC) 150 MG tablet Take 1 tablet (150 mg total) by mouth 2 (two) times daily. 12/18/15   Bobbitt, Heywood Iles, MD    Family History Family History  Problem Relation Age of Onset  . Asthma Father   . Hypertension Father   . Kidney cancer Father   . Asthma Brother   . Hypertension Mother   . Eczema Neg Hx   . Immunodeficiency Neg Hx   . Urticaria Neg Hx     Social History Social History   Tobacco Use  . Smoking status: Never Smoker  . Smokeless tobacco: Never Used  Substance Use Topics  . Alcohol use: No    Alcohol/week: 0.0 standard drinks  . Drug use: Not on file     Allergies   Ace inhibitors, Codeine, Lisinopril, Percocet [oxycodone-acetaminophen], Propoxyphene, and Januvia [sitagliptin]   Review of Systems Review of Systems  Constitutional: Negative for fatigue and fever.  HENT: Positive for facial swelling. Negative for mouth sores.   Eyes: Negative for visual disturbance.  Respiratory: Negative for shortness of breath.   Cardiovascular: Negative for chest pain.  Gastrointestinal: Negative for abdominal pain, nausea and vomiting.  Genitourinary: Negative for genital sores.  Musculoskeletal: Positive for back pain. Negative for arthralgias and joint swelling.  Skin: Positive for rash. Negative for color change and wound.  Neurological: Negative for dizziness, weakness, light-headedness and headaches.     Physical Exam Triage Vital Signs ED Triage Vitals  Enc Vitals Group     BP 03/07/20 1954 (!) 160/98     Pulse Rate 03/07/20 1954 (!) 127     Resp 03/07/20 1954 17     Temp 03/07/20 1954 98.2 F (36.8 C)     Temp Source 03/07/20 1954 Oral     SpO2 03/07/20 1954 97 %     Weight --      Height --      Head Circumference --      Peak Flow --      Pain Score 03/07/20 1953 8     Pain Loc --      Pain Edu? --      Excl. in GC? --    No data found.  Updated Vital Signs BP (!) 160/98 (BP Location: Left Arm)   Pulse (!) 127   Temp 98.2 F (36.8  C) (Oral)   Resp 17   SpO2 97%   Visual Acuity Right Eye Distance:   Left Eye Distance:   Bilateral Distance:    Right Eye Near:   Left Eye Near:    Bilateral Near:     Physical Exam Vitals and nursing note reviewed.  Constitutional:      General: She is not in acute distress.    Appearance: She is well-developed.  HENT:     Head: Normocephalic  and atraumatic.     Mouth/Throat:     Comments: Oral mucosa pink and moist, no tonsillar enlargement or exudate. Tongue appears slightly enlarged, no soft palate swelling. Posterior pharynx patent and nonerythematous, no uvula deviation or swelling. Normal phonation. Eyes:     Extraocular Movements: Extraocular movements intact.     Conjunctiva/sclera: Conjunctivae normal.     Pupils: Pupils are equal, round, and reactive to light.  Cardiovascular:     Rate and Rhythm: Regular rhythm. Tachycardia present.     Heart sounds: No murmur.  Pulmonary:     Effort: Pulmonary effort is normal. No respiratory distress.     Breath sounds: Normal breath sounds.     Comments: Breathing comfortably at rest, CTABL, no wheezing, rales or other adventitious sounds auscultated Abdominal:     Palpations: Abdomen is soft.     Tenderness: There is no abdominal tenderness.  Musculoskeletal:     Cervical back: Neck supple.     Comments: Bilateral legs symmetric without swelling, or tenderness  Back: Nontender to palpation along cervical, thoracic and lumbar spine midline, minimally reproducible tenderness to palpation along right lower thoracic musculature  Moving all extremities appropriately  Skin:    General: Skin is warm and dry.  Neurological:     General: No focal deficit present.     Mental Status: She is alert and oriented to person, place, and time. Mental status is at baseline.     Cranial Nerves: No cranial nerve deficit.     Motor: No weakness.     Comments: Ambulating independently      UC Treatments / Results  Labs (all labs  ordered are listed, but only abnormal results are displayed) Labs Reviewed  GLUCOSE, CAPILLARY - Abnormal; Notable for the following components:      Result Value   Glucose-Capillary 191 (*)    All other components within normal limits  CBG MONITORING, ED - Abnormal; Notable for the following components:   Glucose-Capillary 191 (*)    All other components within normal limits    EKG   Radiology No results found.  Procedures Procedures (including critical care time)  Medications Ordered in UC Medications - No data to display  Initial Impression / Assessment and Plan / UC Course  I have reviewed the triage vital signs and the nursing notes.  Pertinent labs & imaging results that were available during my care of the patient were reviewed by me and considered in my medical decision making (see chart for details).     Patient with back pain, history suggestive of likely spasming, but given associated elevated blood pressure with significant tachycardia without clear explanation or improvement with rest and deep inspiration recommending further evaluation/observation in emergency room.  Patient verbalized understanding and was transported with nursing staff via wheelchair to ED.   Final Clinical Impressions(s) / UC Diagnoses   Final diagnoses:  Tachycardia  Acute right-sided thoracic back pain     Discharge Instructions     Tachycardic 130-140's unclear etiology, right back pain   ED Prescriptions    None     PDMP not reviewed this encounter.   Meliana Canner, [place] C, PA-C 03/07/20 2050

## 2020-03-07 NOTE — Discharge Instructions (Signed)
Tachycardic 130-140's unclear etiology, right back pain

## 2020-03-07 NOTE — ED Triage Notes (Signed)
Sent by [facility] for further eval of elevated HR. Pt reports back spasms since last night. SOB/palpitations since earlier today. Pt in NAD.

## 2020-03-07 NOTE — ED Notes (Signed)
Patient is being discharged from the Urgent Care Center and sent to the Emergency Department via wheelchair by staff. Per Patterson Hammersmith, PA., patient is stable but in need of higher level of care due to tachycardia, back pain. Patient is aware and verbalizes understanding of plan of care.  Vitals:   03/07/20 1954  BP: (!) 160/98  Pulse: (!) 127  Resp: 17  Temp: 98.2 F (36.8 C)  SpO2: 97%

## 2020-03-07 NOTE — ED Triage Notes (Signed)
Pt presents with muscle spasms in back that increase with movement.

## 2020-03-08 ENCOUNTER — Observation Stay (HOSPITAL_COMMUNITY): Payer: BLUE CROSS/BLUE SHIELD

## 2020-03-08 ENCOUNTER — Encounter (HOSPITAL_COMMUNITY): Payer: Self-pay | Admitting: Internal Medicine

## 2020-03-08 ENCOUNTER — Encounter (HOSPITAL_COMMUNITY)
Admission: EM | Disposition: A | Discharge status: Home / Self Care | Payer: Self-pay | Source: Ambulatory Visit | Attending: Internal Medicine

## 2020-03-08 ENCOUNTER — Emergency Department (HOSPITAL_COMMUNITY): Payer: BLUE CROSS/BLUE SHIELD

## 2020-03-08 DIAGNOSIS — R002 Palpitations: Secondary | ICD-10-CM | POA: Diagnosis present

## 2020-03-08 DIAGNOSIS — Z8249 Family history of ischemic heart disease and other diseases of the circulatory system: Secondary | ICD-10-CM | POA: Diagnosis not present

## 2020-03-08 DIAGNOSIS — I340 Nonrheumatic mitral (valve) insufficiency: Secondary | ICD-10-CM

## 2020-03-08 DIAGNOSIS — R778 Other specified abnormalities of plasma proteins: Secondary | ICD-10-CM

## 2020-03-08 DIAGNOSIS — R7989 Other specified abnormal findings of blood chemistry: Secondary | ICD-10-CM | POA: Diagnosis not present

## 2020-03-08 DIAGNOSIS — R079 Chest pain, unspecified: Secondary | ICD-10-CM | POA: Diagnosis not present

## 2020-03-08 DIAGNOSIS — Z8051 Family history of malignant neoplasm of kidney: Secondary | ICD-10-CM | POA: Diagnosis not present

## 2020-03-08 DIAGNOSIS — I5181 Takotsubo syndrome: Secondary | ICD-10-CM | POA: Diagnosis present

## 2020-03-08 DIAGNOSIS — Z885 Allergy status to narcotic agent status: Secondary | ICD-10-CM | POA: Diagnosis not present

## 2020-03-08 DIAGNOSIS — E785 Hyperlipidemia, unspecified: Secondary | ICD-10-CM | POA: Diagnosis present

## 2020-03-08 DIAGNOSIS — E119 Type 2 diabetes mellitus without complications: Secondary | ICD-10-CM | POA: Diagnosis present

## 2020-03-08 DIAGNOSIS — I422 Other hypertrophic cardiomyopathy: Secondary | ICD-10-CM | POA: Diagnosis not present

## 2020-03-08 DIAGNOSIS — K219 Gastro-esophageal reflux disease without esophagitis: Secondary | ICD-10-CM | POA: Diagnosis present

## 2020-03-08 DIAGNOSIS — Z7984 Long term (current) use of oral hypoglycemic drugs: Secondary | ICD-10-CM | POA: Diagnosis not present

## 2020-03-08 DIAGNOSIS — I10 Essential (primary) hypertension: Secondary | ICD-10-CM | POA: Diagnosis present

## 2020-03-08 DIAGNOSIS — Z825 Family history of asthma and other chronic lower respiratory diseases: Secondary | ICD-10-CM | POA: Diagnosis not present

## 2020-03-08 DIAGNOSIS — M62830 Muscle spasm of back: Secondary | ICD-10-CM | POA: Diagnosis present

## 2020-03-08 DIAGNOSIS — Z888 Allergy status to other drugs, medicaments and biological substances status: Secondary | ICD-10-CM | POA: Diagnosis not present

## 2020-03-08 DIAGNOSIS — Z20822 Contact with and (suspected) exposure to covid-19: Secondary | ICD-10-CM | POA: Diagnosis present

## 2020-03-08 DIAGNOSIS — R000 Tachycardia, unspecified: Secondary | ICD-10-CM | POA: Diagnosis present

## 2020-03-08 DIAGNOSIS — Z79899 Other long term (current) drug therapy: Secondary | ICD-10-CM | POA: Diagnosis not present

## 2020-03-08 DIAGNOSIS — E7800 Pure hypercholesterolemia, unspecified: Secondary | ICD-10-CM | POA: Diagnosis present

## 2020-03-08 HISTORY — PX: RIGHT/LEFT HEART CATH AND CORONARY ANGIOGRAPHY: CATH118266

## 2020-03-08 LAB — POCT I-STAT EG7
Acid-base deficit: 12 mmol/L — ABNORMAL HIGH (ref 0.0–2.0)
Bicarbonate: 13.8 mmol/L — ABNORMAL LOW (ref 20.0–28.0)
Calcium, Ion: 1.34 mmol/L (ref 1.15–1.40)
HCT: 39 % (ref 36.0–46.0)
Hemoglobin: 13.3 g/dL (ref 12.0–15.0)
O2 Saturation: 79 %
Potassium: 3.3 mmol/L — ABNORMAL LOW (ref 3.5–5.1)
Sodium: 144 mmol/L (ref 135–145)
TCO2: 15 mmol/L — ABNORMAL LOW (ref 22–32)
pCO2, Ven: 31.3 mmHg — ABNORMAL LOW (ref 44.0–60.0)
pH, Ven: 7.252 (ref 7.250–7.430)
pO2, Ven: 49 mmHg — ABNORMAL HIGH (ref 32.0–45.0)

## 2020-03-08 LAB — LIPID PANEL
Cholesterol: 143 mg/dL (ref 0–200)
HDL: 80 mg/dL (ref >40)
LDL Cholesterol: 55 mg/dL (ref 0–99)
Total CHOL/HDL Ratio: 1.8 RATIO
Triglycerides: 41 mg/dL (ref <150)
VLDL: 8 mg/dL (ref 0–40)

## 2020-03-08 LAB — TSH: TSH: 0.339 u[IU]/mL — ABNORMAL LOW (ref 0.350–4.500)

## 2020-03-08 LAB — POCT I-STAT 7, (LYTES, BLD GAS, ICA,H+H)
Acid-base deficit: 11 mmol/L — ABNORMAL HIGH (ref 0.0–2.0)
Bicarbonate: 13.8 mmol/L — ABNORMAL LOW (ref 20.0–28.0)
Calcium, Ion: 1.26 mmol/L (ref 1.15–1.40)
HCT: 37 % (ref 36.0–46.0)
Hemoglobin: 12.6 g/dL (ref 12.0–15.0)
O2 Saturation: 99 %
Potassium: 3.2 mmol/L — ABNORMAL LOW (ref 3.5–5.1)
Sodium: 145 mmol/L (ref 135–145)
TCO2: 15 mmol/L — ABNORMAL LOW (ref 22–32)
pCO2 arterial: 28.1 mmHg — ABNORMAL LOW (ref 32.0–48.0)
pH, Arterial: 7.299 — ABNORMAL LOW (ref 7.350–7.450)
pO2, Arterial: 146 mmHg — ABNORMAL HIGH (ref 83.0–108.0)

## 2020-03-08 LAB — CBG MONITORING, ED
Glucose-Capillary: 151 mg/dL — ABNORMAL HIGH (ref 70–99)
Glucose-Capillary: 158 mg/dL — ABNORMAL HIGH (ref 70–99)

## 2020-03-08 LAB — SARS CORONAVIRUS 2 (TAT 6-24 HRS): SARS Coronavirus 2: NEGATIVE

## 2020-03-08 LAB — RAPID URINE DRUG SCREEN, HOSP PERFORMED
Amphetamines: NOT DETECTED
Barbiturates: NOT DETECTED
Benzodiazepines: NOT DETECTED
Cocaine: NOT DETECTED
Opiates: NOT DETECTED
Tetrahydrocannabinol: NOT DETECTED

## 2020-03-08 LAB — TROPONIN I (HIGH SENSITIVITY)
Troponin I (High Sensitivity): 19 ng/L — ABNORMAL HIGH (ref <18)
Troponin I (High Sensitivity): 174 ng/L (ref <18)
Troponin I (High Sensitivity): 284 ng/L (ref <18)

## 2020-03-08 LAB — ECHOCARDIOGRAM COMPLETE
Height: 63 in
Weight: 2240 oz

## 2020-03-08 LAB — SEDIMENTATION RATE: Sed Rate: 44 mm/hr — ABNORMAL HIGH (ref 0–22)

## 2020-03-08 LAB — HIV ANTIBODY (ROUTINE TESTING W REFLEX): HIV Screen 4th Generation wRfx: NONREACTIVE

## 2020-03-08 LAB — GLUCOSE, CAPILLARY: Glucose-Capillary: 114 mg/dL — ABNORMAL HIGH (ref 70–99)

## 2020-03-08 LAB — POCT ACTIVATED CLOTTING TIME: Activated Clotting Time: 136 seconds

## 2020-03-08 SURGERY — RIGHT/LEFT HEART CATH AND CORONARY ANGIOGRAPHY
Anesthesia: LOCAL

## 2020-03-08 MED ORDER — SODIUM CHLORIDE 0.9 % WEIGHT BASED INFUSION
1.0000 mL/kg/h | INTRAVENOUS | Status: DC
  Administered 2020-03-08: 1 mL/kg/h via INTRAVENOUS

## 2020-03-08 MED ORDER — GABAPENTIN 100 MG PO CAPS
100.0000 mg | ORAL_CAPSULE | Freq: Every day | ORAL | Status: DC
  Administered 2020-03-08 – 2020-03-09 (×2): 100 mg via ORAL
  Filled 2020-03-08 (×2): qty 1

## 2020-03-08 MED ORDER — ACETAMINOPHEN 325 MG PO TABS: 650.0000 mg | ORAL_TABLET | ORAL | Status: DC | PRN

## 2020-03-08 MED ORDER — SODIUM CHLORIDE 0.9% FLUSH
3.0000 mL | Freq: Two times a day (BID) | INTRAVENOUS | Status: DC
  Administered 2020-03-09: 3 mL via INTRAVENOUS

## 2020-03-08 MED ORDER — MIDAZOLAM HCL 2 MG/2ML IJ SOLN
INTRAMUSCULAR | Status: DC | PRN
  Administered 2020-03-08: 1 mg via INTRAVENOUS

## 2020-03-08 MED ORDER — SODIUM CHLORIDE 0.9 % IV SOLN: 250.0000 mL | INTRAVENOUS | Status: DC | PRN

## 2020-03-08 MED ORDER — HEPARIN BOLUS VIA INFUSION
3500.0000 [IU] | Freq: Once | INTRAVENOUS | Status: AC
  Administered 2020-03-08: 3500 [IU] via INTRAVENOUS
  Filled 2020-03-08: qty 3500

## 2020-03-08 MED ORDER — HEPARIN (PORCINE) 25000 UT/250ML-% IV SOLN
750.0000 [IU]/h | INTRAVENOUS | Status: DC
  Administered 2020-03-08: 750 [IU]/h via INTRAVENOUS
  Filled 2020-03-08: qty 250

## 2020-03-08 MED ORDER — SODIUM CHLORIDE 0.9% FLUSH
3.0000 mL | Freq: Two times a day (BID) | INTRAVENOUS | Status: DC
  Administered 2020-03-09: 10:00:00 3 mL via INTRAVENOUS

## 2020-03-08 MED ORDER — IOHEXOL 350 MG/ML SOLN
100.0000 mL | Freq: Once | INTRAVENOUS | Status: AC | PRN
  Administered 2020-03-08: 100 mL via INTRAVENOUS

## 2020-03-08 MED ORDER — LIDOCAINE HCL (PF) 1 % IJ SOLN
INTRAMUSCULAR | Status: AC
  Filled 2020-03-08: qty 30

## 2020-03-08 MED ORDER — SODIUM CHLORIDE 0.9% FLUSH: 3.0000 mL | INTRAVENOUS | Status: DC | PRN

## 2020-03-08 MED ORDER — LABETALOL HCL 5 MG/ML IV SOLN: 10.0000 mg | INTRAVENOUS | Status: DC | PRN

## 2020-03-08 MED ORDER — IOHEXOL 350 MG/ML SOLN
INTRAVENOUS | Status: DC | PRN
  Administered 2020-03-08: 75 mL via INTRA_ARTERIAL

## 2020-03-08 MED ORDER — FENTANYL CITRATE (PF) 100 MCG/2ML IJ SOLN
INTRAMUSCULAR | Status: DC | PRN
  Administered 2020-03-08: 25 ug via INTRAVENOUS

## 2020-03-08 MED ORDER — NITROGLYCERIN 0.4 MG SL SUBL: 0.4000 mg | SUBLINGUAL_TABLET | SUBLINGUAL | Status: DC | PRN

## 2020-03-08 MED ORDER — ENOXAPARIN SODIUM 40 MG/0.4ML ~~LOC~~ SOLN
40.0000 mg | SUBCUTANEOUS | Status: DC
  Administered 2020-03-09: 10:00:00 40 mg via SUBCUTANEOUS
  Filled 2020-03-08: qty 0.4

## 2020-03-08 MED ORDER — HEPARIN (PORCINE) IN NACL 1000-0.9 UT/500ML-% IV SOLN
INTRAVENOUS | Status: DC | PRN
  Administered 2020-03-08 (×2): 500 mL

## 2020-03-08 MED ORDER — SODIUM CHLORIDE 0.9 % IV SOLN: INTRAVENOUS | Status: DC

## 2020-03-08 MED ORDER — LORATADINE 10 MG PO TABS
10.0000 mg | ORAL_TABLET | Freq: Every day | ORAL | Status: DC
  Administered 2020-03-08 – 2020-03-09 (×2): 10 mg via ORAL
  Filled 2020-03-08 (×2): qty 1

## 2020-03-08 MED ORDER — METOPROLOL TARTRATE 100 MG PO TABS
100.0000 mg | ORAL_TABLET | Freq: Two times a day (BID) | ORAL | Status: DC
  Administered 2020-03-08 – 2020-03-09 (×2): 100 mg via ORAL
  Filled 2020-03-08 (×2): qty 1

## 2020-03-08 MED ORDER — INSULIN ASPART 100 UNIT/ML ~~LOC~~ SOLN: 0.0000 [IU] | Freq: Every day | SUBCUTANEOUS | Status: DC

## 2020-03-08 MED ORDER — POTASSIUM CHLORIDE CRYS ER 20 MEQ PO TBCR
40.0000 meq | EXTENDED_RELEASE_TABLET | Freq: Once | ORAL | Status: AC
  Administered 2020-03-08: 40 meq via ORAL
  Filled 2020-03-08: qty 2

## 2020-03-08 MED ORDER — SODIUM CHLORIDE 0.9 % IV BOLUS
1000.0000 mL | Freq: Once | INTRAVENOUS | Status: AC
  Administered 2020-03-08: 1000 mL via INTRAVENOUS

## 2020-03-08 MED ORDER — AMLODIPINE BESYLATE 10 MG PO TABS
10.0000 mg | ORAL_TABLET | Freq: Every day | ORAL | Status: DC
  Administered 2020-03-08: 10 mg via ORAL
  Filled 2020-03-08: qty 2

## 2020-03-08 MED ORDER — LORAZEPAM 2 MG/ML IJ SOLN
0.5000 mg | Freq: Once | INTRAMUSCULAR | Status: AC
  Administered 2020-03-08: 02:00:00 0.5 mg via INTRAVENOUS
  Filled 2020-03-08: qty 1

## 2020-03-08 MED ORDER — INSULIN ASPART 100 UNIT/ML ~~LOC~~ SOLN
0.0000 [IU] | Freq: Three times a day (TID) | SUBCUTANEOUS | Status: DC
  Administered 2020-03-09: 8 [IU] via SUBCUTANEOUS
  Administered 2020-03-09: 3 [IU] via SUBCUTANEOUS

## 2020-03-08 MED ORDER — ASPIRIN 81 MG PO CHEW
324.0000 mg | CHEWABLE_TABLET | Freq: Once | ORAL | Status: AC
  Administered 2020-03-08: 324 mg via ORAL
  Filled 2020-03-08: qty 4

## 2020-03-08 MED ORDER — PRAVASTATIN SODIUM 10 MG PO TABS: 20.0000 mg | ORAL_TABLET | Freq: Every day | ORAL | Status: DC

## 2020-03-08 MED ORDER — ONDANSETRON HCL 4 MG/2ML IJ SOLN: 4.0000 mg | Freq: Four times a day (QID) | INTRAMUSCULAR | Status: DC | PRN

## 2020-03-08 MED ORDER — HEPARIN (PORCINE) IN NACL 1000-0.9 UT/500ML-% IV SOLN
INTRAVENOUS | Status: AC
  Filled 2020-03-08: qty 1000

## 2020-03-08 MED ORDER — METOPROLOL TARTRATE 50 MG PO TABS
50.0000 mg | ORAL_TABLET | Freq: Two times a day (BID) | ORAL | Status: DC
  Administered 2020-03-08: 50 mg via ORAL
  Filled 2020-03-08: qty 2

## 2020-03-08 MED ORDER — SODIUM CHLORIDE 0.9 % WEIGHT BASED INFUSION
3.0000 mL/kg/h | INTRAVENOUS | Status: DC
  Administered 2020-03-08: 3 mL/kg/h via INTRAVENOUS

## 2020-03-08 MED ORDER — MORPHINE SULFATE (PF) 2 MG/ML IV SOLN: 2.0000 mg | INTRAVENOUS | Status: DC | PRN

## 2020-03-08 MED ORDER — LIDOCAINE HCL (PF) 1 % IJ SOLN
INTRAMUSCULAR | Status: DC | PRN
  Administered 2020-03-08: 20 mL via SUBCUTANEOUS

## 2020-03-08 MED ORDER — FENTANYL CITRATE (PF) 100 MCG/2ML IJ SOLN
INTRAMUSCULAR | Status: AC
  Filled 2020-03-08: qty 2

## 2020-03-08 MED ORDER — DIAZEPAM 5 MG PO TABS
5.0000 mg | ORAL_TABLET | Freq: Once | ORAL | Status: AC
  Administered 2020-03-08: 5 mg via ORAL
  Filled 2020-03-08: qty 1

## 2020-03-08 MED ORDER — METHOCARBAMOL 1000 MG/10ML IJ SOLN
500.0000 mg | Freq: Four times a day (QID) | INTRAVENOUS | Status: DC | PRN
  Administered 2020-03-09: 500 mg via INTRAVENOUS
  Filled 2020-03-08: qty 500
  Filled 2020-03-08 (×2): qty 5

## 2020-03-08 MED ORDER — MIDAZOLAM HCL 2 MG/2ML IJ SOLN
INTRAMUSCULAR | Status: AC
  Filled 2020-03-08: qty 2

## 2020-03-08 SURGICAL SUPPLY — 16 items
CATH INFINITI 5FR MULTPACK ANG (CATHETERS) ×1 IMPLANT
CATH SWAN GANZ 7F STRAIGHT (CATHETERS) ×1 IMPLANT
CLOSURE MYNX CONTROL 6F/7F (Vascular Products) ×1 IMPLANT
KIT HEART LEFT (KITS) ×2 IMPLANT
KIT MICROPUNCTURE NIT STIFF (SHEATH) ×1 IMPLANT
PACK CARDIAC CATHETERIZATION (CUSTOM PROCEDURE TRAY) ×2 IMPLANT
PINNACLE LONG 6F 25CM (SHEATH) ×2
SHEATH INTRO PINNACLE 6F 25CM (SHEATH) IMPLANT
SHEATH PINNACLE 5F 10CM (SHEATH) ×1 IMPLANT
SHEATH PINNACLE 6F 10CM (SHEATH) ×1 IMPLANT
SHEATH PINNACLE 7F 10CM (SHEATH) ×1 IMPLANT
SHEATH PROBE COVER 6X72 (BAG) ×1 IMPLANT
TRANSDUCER W/STOPCOCK (MISCELLANEOUS) ×3 IMPLANT
TUBING CIL FLEX 10 FLL-RA (TUBING) IMPLANT
WIRE EMERALD 3MM-J .025X260CM (WIRE) ×1 IMPLANT
WIRE EMERALD 3MM-J .035X150CM (WIRE) ×1 IMPLANT

## 2020-03-08 NOTE — H&P (Signed)
History and Physical    Clois DESHANNA KAMA YIF:[id number] DOB: 1956/04/19 DOA: 03/07/2020  PCP: [name], MD Consultants:  Allyson Sabal - cardiology; has an appt with allergy on 4/20 Patient coming from:  Home - lives with grandkids; NOK: Mother. Lovette Cliche, [phone]  Chief Complaint: Back spasms, tachycardia  HPI: Matayah R Frankowski is a 64 y.o. female with medical history significant of HTN and DM presenting with back spasms, tachycardia. She started with severe allergies - has a knot on her lip and then tongue then moved to the opposite side, hands itching and swelling and scratching until it is raw due to hives.  2 days ago, she went to bed and developed muscle spasms in her back (not as severe as the ones in October when she was seen at [facility]).  Yesterday, she was working and the pain caused her to cry out in pain.  She went to Urgent Care.  Her HR was very fast and so they sent her to the ER.  She felt tired and noticed a little bit of chest discomfort but mostly it was her back.  At rest, her back is fine.  When moving to the bathroom, it was a little better - she has to plan her movements in order to make it ok.  When the pain hits, she gets SOB but otherwise none.   She has a very remote h/o tachycardia, "I've just been stressed out."    ED Course:  Carryover, per Dr. Loney Loh:  History of hypertension, diabetes, hyperlipidemia initially went to urgent care with complaints of right upper back pain which was felt to be musculoskeletal but she was tachycardic so sent to the ED. Sinus tach with rate in the 130s to 140s. Complained of mild chest discomfort. CT angiogram negative for PE. Initial troponin negative, repeat 19. EKG negative for ischemia. Currently chest pain-free. Admission requested for chest pain observation.  Review of Systems: As per HPI; otherwise review of systems reviewed and negative.   Ambulatory Status:  Ambulates without assistance except when she has  muscle spasms  COVID Vaccine Status:  First shot  Past Medical History:  Diagnosis Date  . Back spasm   . Diabetes mellitus   . Hypertension   . Systolic ejection murmur 1986    Past Surgical History:  Procedure Laterality Date  . CESAREAN SECTION      Social History   Socioeconomic History  . Marital status: Legally Separated    Spouse name: Not on file  . Number of children: Not on file  . Years of education: Not on file  . Highest education level: Not on file  Occupational History  . 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 Smoking status: Never Smoker  . Smokeless tobacco: Never Used  Substance and Sexual Activity  . Alcohol use: No    Alcohol/week: 0.0 standard drinks  . Drug use: Never  . Sexual activity: Not on file  Other Topics Concern  . Not on file  Social History Narrative  . Not on file   Social Determinants of Health   Financial Resource Strain:   . Difficulty of Paying Living Expenses:   Food Insecurity:   . Worried About Programme researcher, broadcasting/film/video in the Last Year:   . Barista in the Last Year:   Transportation Needs:   . Freight forwarder (Medical):   Marland Kitchen Lack of Transportation (Non-Medical):   Physical Activity:   .  Days of Exercise per Week:   . Minutes of Exercise per Session:   Stress:   . Feeling of Stress :   Social Connections:   . Frequency of Communication with Friends and Family:   . Frequency of Social Gatherings with Friends and Family:   . Attends Religious Services:   . Active Member of Clubs or Organizations:   . Attends BankerClub or Organization Meetings:   Marland Kitchen. Marital Status:   Intimate Partner Violence:   . Fear of Current or Ex-Partner:   . Emotionally Abused:   Marland Kitchen. Physically Abused:   . Sexually Abused:     Allergies  Allergen Reactions  . Ace Inhibitors Itching and Swelling  . Citrus Hives, Swelling and Other (See Comments)    Pt says she gets knots.   . Codeine Nausea And  Vomiting    Excessive vomiting  . Lisinopril Hives  . Other Itching, Swelling and Other (See Comments)    Any type of nut Pt will get mouth blisters  . Percocet [Oxycodone-Acetaminophen]     Disoriented, difficulty waking up and hard to focus, vomiting  . Propoxyphene Other (See Comments)    Darvocet (Propoxyphene-Acetaminophen).  Causes Disorientation, difficulty waking up, difficulty focusing, and vomiting.  Alma Friendly. Januvia [Sitagliptin] Itching and Palpitations    Family History  Problem Relation Age of Onset  . Asthma Father   . Hypertension Father   . Kidney cancer Father   . CVA Father [id number]  . Asthma Brother   . Colon cancer Brother [id number]  . Hypertension Mother   . Eczema Neg Hx   . Immunodeficiency Neg Hx   . Urticaria Neg Hx     Prior to Admission medications   Medication Sig Start Date End Date Taking? Authorizing Provider  amLODipine (NORVASC) 10 MG tablet Take 10 mg by mouth daily. Reported on 12/18/2015    [provider]  atenolol (TENORMIN) 50 MG tablet Take 50 mg by mouth 2 (two) times daily.    [provider]  cetirizine (ZYRTEC) 10 MG chewable tablet Chew 10 mg by mouth 2 (two) times daily.    [provider]  clarithromycin (BIAXIN XL) 500 MG 24 hr tablet Take 1 tablet (500 mg total) by mouth 2 (two) times daily. 01/29/16   Bobbitt, Heywood Ilesalph Carter, MD  diphenhydrAMINE (BENADRYL) 25 MG tablet Take 25 mg by mouth. 1-2 tablets twice daily as needed.    [provider]  fluticasone (FLONASE) 50 MCG/ACT nasal spray USE ONE SPRAY IN EACH NOSTRIL ONCE OR TWICE DAILY AS NEEDED FOR STUFFY NOSE OR DRAINAGE 12/10/15   Bobbitt, Heywood Ilesalph Carter, MD  gabapentin (NEURONTIN) 100 MG capsule Take 3 capsules (300 mg total) by mouth 3 (three) times daily. 02/12/16   Levert FeinsteinYan, Yijun, MD  glimepiride (AMARYL) 4 MG tablet Take 4 mg by mouth daily with breakfast.    [provider]  levocetirizine (XYZAL) 5 MG tablet TAKE ONE TABLET ONCE DAILY AS DIRECTED 09/28/16    Bobbitt, Heywood Ilesalph Carter, MD  lisinopril (PRINIVIL,ZESTRIL) 10 MG tablet Take 10 mg by mouth daily. Reported on 01/29/2016    [provider]  metFORMIN (GLUCOPHAGE-XR) 750 MG 24 hr tablet Take 750 mg by mouth 2 (two) times daily.    [provider]  methocarbamol (ROBAXIN) 500 MG tablet Take 1 tablet (500 mg total) by mouth 2 (two) times daily. 09/19/19   Mickie Bailate, Kelly H, NP  omeprazole (PRILOSEC) 20 MG capsule Take 1 capsule (20 mg total) by mouth 2 (two) times  daily before a meal. 12/24/15 01/07/16  Bobbitt, Heywood Iles, MD  omeprazole (PRILOSEC) 20 MG capsule Take 1 capsule (20 mg total) by mouth daily. 01/09/16   Bobbitt, Heywood Iles, MD  omeprazole (PRILOSEC) 20 MG capsule Take 1 capsule (20 mg total) by mouth daily. 01/29/16   Bobbitt, Heywood Iles, MD  ranitidine (ZANTAC) 150 MG tablet Take 1 tablet (150 mg total) by mouth 2 (two) times daily. 12/18/15   Bobbitt, Heywood Iles, MD    Physical Exam: Vitals:   03/08/20 1718 03/08/20 1723 03/08/20 1728 03/08/20 1823  BP: 103/75 105/67  125/76  Pulse: 93 95 (!) 0 100  Resp: 13 20 (!) 0   Temp:      TempSrc:      SpO2: 100% 99% (!) 0%   Weight:      Height:         . General:  Appears calm and comfortable and is NAD . Eyes:  PERRL, EOMI, normal lids, iris . ENT:  grossly normal hearing, lips & tongue, mmm . Neck:  no LAD, masses or thyromegaly . Cardiovascular:  RR with persistent tachycardia, no m/r/g. No LE edema.  Marland Kitchen Respiratory:   CTA bilaterally with no wheezes/rales/rhonchi.  Normal respiratory effort. . Abdomen:  soft, NT, ND, NABS . Skin:  no rash or induration seen on limited exam . Musculoskeletal:  grossly normal tone BUE/BLE, good ROM, no bony abnormality . Psychiatric:  Mildly anxious mood and affect, speech fluent and appropriate, AOx3 . Neurologic:  CN 2-12 grossly intact, moves all extremities in coordinated fashion    Radiological Exams on Admission: DG Chest 2 View  Result Date: 03/07/2020 CLINICAL  DATA:  64 year old female with shortness of breath and tachycardia. EXAM: CHEST - 2 VIEW COMPARISON:  Chest radiographs 04/02/2008 and earlier. FINDINGS: Stable mild tortuosity of the thoracic aorta. Other mediastinal contours are within normal limits. Visualized tracheal air column is within normal limits. Lung volumes are stable and within normal limits. Both lungs appear clear. Negative visible bowel gas pattern and osseous structures. IMPRESSION: Negative.  No acute cardiopulmonary abnormality. Electronically Signed   By: Odessa Fleming M.D.   On: 03/07/2020 21:30   CT ANGIO CHEST PE W OR WO CONTRAST  Result Date: 03/08/2020 CLINICAL DATA:  Shortness of breath. EXAM: CT ANGIOGRAPHY CHEST WITH CONTRAST TECHNIQUE: Multidetector CT imaging of the chest was performed using the standard protocol during bolus administration of intravenous contrast. Multiplanar CT image reconstructions and MIPs were obtained to evaluate the vascular anatomy. CONTRAST:  OMNIPAQUE IOHEXOL 350 MG/ML SOLN COMPARISON:  None. FINDINGS: Cardiovascular: Satisfactory opacification of the pulmonary arteries to the segmental level. No evidence of pulmonary embolism. A mild amount of artifact is suspected along the a posteromedial lower branch of the left pulmonary artery. Normal heart size. No pericardial effusion. Mediastinum/Nodes: No enlarged mediastinal, hilar, or axillary lymph nodes. Thyroid gland, trachea, and esophagus demonstrate no significant findings. Lungs/Pleura: Very mild posterior bilateral lower lobe atelectasis seen. There is no evidence of acute infiltrate, pleural effusion or pneumothorax. Upper Abdomen: There is a small hiatal hernia. Musculoskeletal: No chest wall abnormality. No acute or significant osseous findings. Review of the MIP images confirms the above findings. IMPRESSION: 1. No evidence of pulmonary embolus. 2. Very mild posterior bilateral lower lobe atelectasis. 3. Small hiatal hernia. Electronically Signed    By: Aram Candela M.D.   On: 03/08/2020 03:19   ECHOCARDIOGRAM COMPLETE  Result Date: 03/08/2020    ECHOCARDIOGRAM REPORT   Patient Name:  Geanie Berlin Date of Exam: 03/08/2020 Medical Rec #:  [id number]        Height:       63.0 in Accession #:    [id number]       Weight:       140.0 lb Date of Birth:  01/14/1956         BSA:          1.662 m Patient Age:    39 years         BP:           148/82 mmHg Patient Gender: F                HR:           120 bpm. Exam Location:  Inpatient Procedure: 2D Echo, Color Doppler and Cardiac Doppler STAT ECHO Indications:    R07.9* Chest pain, unspecified  History:        Patient has prior history of Echocardiogram examinations, most                 recent 08/11/2018. Signs/Symptoms:Murmur; Risk                 Factors:Hypertension, Diabetes and Dyslipidemia.  Sonographer:    Raquel Sarna Senior RDCS Referring Phys: 68 RHONDA G BARRETT  Sonographer Comments: IMPRESSIONS  1. Left ventricular ejection fraction, by estimation, is 45 to 50%. The left ventricle has mildly decreased function. There is apical dyskinesis/ballooning with hyperdynamic contraction of the basal segments. There is mild asymmetric left ventricular hypertrophy of the basal-septal segment. Indeterminate diastolic filling due to E-A fusion.  2. There is severe systolic anterior motion of the mitral valve with severe left ventricular outflow obstruction and a dynamic gradient up to 80 mm Hg.  3. Right ventricular systolic function is hyperdynamic. The right ventricular size is normal. There is normal pulmonary artery systolic pressure.  4. Left atrial size was mildly dilated.  5. The mitral valve is normal in structure. Moderate to severe mitral valve regurgitation. No evidence of mitral stenosis.  6. The aortic valve is tricuspid. Aortic valve regurgitation is not visualized. Mild aortic valve sclerosis is present, with no evidence of aortic valve stenosis. Comparison(s): Prior images reviewed side by side.  Changes from prior study are noted. Compared to the previous study, there is a new apical wall motion abnormality with compensatory hyperdynamic contractility of the basal segments. There is secondary severe dynamic LV outflow tract obstruction and moderate to severe mitral insufficiency due to systolic anterior motion. The overall appearance is suggestive of stress cardiomyopathy (takotsubo syndrome), although severe LAD territory ischemia may produce a similar pattern. While there may be a component of preexisting hypertrophic obstructive cardiomyopathy based on the 2019 echo, the majority of the pathophysiological changes are acute (note mild LVH, absence of major left atrial dilation and remarkably high mitral annulus diastolic velocities). Serial studies, cardiac MRI and correlation with ECG and cardiac enzymes will help clarify the diagnosis. FINDINGS  Left Ventricle: Left ventricular ejection fraction, by estimation, is 45 to 50%. The left ventricle has mildly decreased function. The left ventricle demonstrates regional wall motion abnormalities. The left ventricular internal cavity size was normal in size. There is mild asymmetric left ventricular hypertrophy of the basal-septal segment. Indeterminate diastolic filling due to E-A fusion. Right Ventricle: The right ventricular size is normal. No increase in right ventricular wall thickness. Right ventricular systolic function is hyperdynamic. There is normal pulmonary artery systolic pressure. The tricuspid regurgitant velocity  is 2.22 m/s, and with an assumed right atrial pressure of 3 mmHg, the estimated right ventricular systolic pressure is 22.7 mmHg. Left Atrium: Left atrial size was mildly dilated. Right Atrium: Right atrial size was normal in size. Pericardium: There is no evidence of pericardial effusion. Mitral Valve: The mitral valve is normal in structure. Mild mitral annular calcification. Moderate to severe mitral valve regurgitation, with  centrally-directed jet. No evidence of mitral valve stenosis. Tricuspid Valve: The tricuspid valve is normal in structure. Tricuspid valve regurgitation is not demonstrated. Aortic Valve: The aortic valve is tricuspid. Aortic valve regurgitation is not visualized. Mild aortic valve sclerosis is present, with no evidence of aortic valve stenosis. Pulmonic Valve: The pulmonic valve was grossly normal. Pulmonic valve regurgitation is not visualized. Aorta: The aortic root and ascending aorta are structurally normal, with no evidence of dilatation. IAS/Shunts: No atrial level shunt detected by color flow Doppler.  LEFT VENTRICLE PLAX 2D LVIDd:         3.50 cm LVIDs:         2.80 cm LV PW:         0.70 cm LV IVS:        1.40 cm LVOT diam:     1.80 cm LVOT Area:     2.54 cm  RIGHT VENTRICLE RV S prime:     21.10 cm/s TAPSE (M-mode): 2.0 cm LEFT ATRIUM             Index       RIGHT ATRIUM          Index LA diam:        3.70 cm 2.23 cm/m  RA Area:     9.87 cm LA Vol (A2C):   52.0 ml 31.29 ml/m RA Volume:   19.30 ml 11.61 ml/m LA Vol (A4C):   39.9 ml 24.01 ml/m LA Biplane Vol: 47.1 ml 28.35 ml/m   AORTA Ao Root diam: 2.90 cm Ao Asc diam:  3.40 cm TRICUSPID VALVE TR Peak grad:   19.7 mmHg TR Vmax:        222.00 cm/s  SHUNTS Systemic Diam: 1.80 cm Rachelle Hora Croitoru MD Electronically signed by Thurmon Fair MD Signature Date/Time: 03/08/2020/11:03:17 AM    Final     EKG: Independently reviewed.  Sinus tachycardia with rate 108; nonspecific ST changes with no evidence of acute ischemia   Labs on Admission: I have personally reviewed the available labs and imaging studies at the time of the admission.  Pertinent labs:   K+ 3.3 Glucose 218 Anion gap 19 HS troponin 5, 19, 174, 284 Normal CBC COVID pending   Assessment/Plan Principal Problem:   Elevated troponin Active Problems:   Diabetes mellitus (HCC)   Dyslipidemia   Essential hypertension   Chest pain   Back muscle spasm   CP/elevated troponin   -Patient with substernal chest pain < back spasms as primary complaint -She was found to have persistent tachycardia with EKG changes concerning for strain. -CXR unremarkable.   -Initial cardiac enzymes negative but troponin increased during ER time.   -EKG with apparent anterolateral ST depression of uncertain duration, no apparent STEMI. -STAT Echo was performed with EF 45-50% and concern for WMA -As a result of abnormal troponin and echo, urgent cardiac catheterization was performed -Will admit since the patient has positive troponins and/or an abnormal EKG with angina necessitating acute intervention. -Risk factor stratification with HgbA1c and FLP; will also check TSH and UDS -Cardiology consultation -morphine given -Start Lipitor 40 mg  qhs for now -Risk factor stratification with FLP and HgbA1c; will also check TSH and UDS -Heparin per pharmacy, but this has been stopped post-cath -It is possible that patient has Takotsubo cardiomyopathy as the source for her symptoms  HTN -Hold Norvasc -Change Atenolol to Metoprolol BID  HLD -Continue Pravachol -Lipids were checked: 143/80/55/41 - excellent control  DM -Last A1c was 6.2 remotely, will recheck -Hold Jardiance, Metformin -Will cover with moderate-scale SSI for now  Back spasms -Patient with periodic severe back spasms -She reports no recent control -Will give one-time dose of Valium 5 mg PO  -Will give morphine and robaxin as needed   Note: This patient has been tested and is negative for the novel coronavirus COVID-19.   DVT prophylaxis: Heparin drip -> Lovenox post-cath Code Status:  Full - confirmed with patient Family Communication: None present; I spoke with her mother post-cath Disposition Plan:  Home once clinically improved Consults called: Cardiology  Admission status: Admit - It is my clinical opinion that admission to INPATIENT is reasonable and necessary because of the expectation that this patient will  require hospital care that crosses at least 2 midnights to treat this condition based on the medical complexity of the problems presented.  Given the aforementioned information, the predictability of an adverse outcome is felt to be significant.    Jonah Blue MD [facility]   How to contact the [facility] Attending or Consulting provider 7A - 7P or covering provider during after hours 7P -7A, for this patient?  1. Check the care team in [facility] and look for a) attending/consulting [facility] provider listed and b) the [facility] team listed 2. Log into [email or web address] and use [place]'s universal password to access. If you do not have the password, please contact the hospital operator. 3. Locate the [facility] provider you are looking for under [facility] and page to a number that you can be directly reached. 4. If you still have difficulty reaching the provider, please page the [facility] (Director on Call) for the Hospitalists listed on amion for assistance.   03/08/2020, 6:39 PM

## 2020-03-08 NOTE — ED Notes (Signed)
Cardiology at bedside.

## 2020-03-08 NOTE — Consult Note (Signed)
Cardiology Consult:   Patient ID: Sharon Munoz; MRN: [id number]; DOB: Nov 17, 1956   Admission date: 03/07/2020  Primary Care Provider: [name], MD Primary Cardiologist: Nanetta Batty, MD 05/10/2018 Primary Electrophysiologist: None    Chief Complaint:  Chest pain  Patient Profile:   Sharon Munoz is a 64 y.o. female with a history of DM, HTN, muscle spasms, SEM dx 1986, palpitations.   She came to the ER with back pain and tachycardia. ECG appears abnl, cards asked to see by Dr Ophelia Charter.  History of Present Illness:   [name] was in her usual state of health until recently.  She exercises regularly, taking her 65-year-old to the park.  She walks a mile at a time for exercise.  She has no history of chest pain or new shortness of breath with exertion.  Recently, she was placed on Januvia.  She did not tolerate that, so she was allergic to it and it was discontinued.  Recently, she has been having trouble with allergies.  She said knots would come up every day on different places on her mouth or face, one was on the right side of her throat.  She has been treated with prednisone in the past for these.  She has also had itching in her hands.  She had problems with muscle spasms last year.  She was treated with prednisone and muscle relaxers.  Yesterday, she started having muscle spasms again.  They are in the right side of her back.  They go from down below her waist all the way up into her shoulder.  They are aggravated by certain movements.  If she is sitting still, she has to be extremely careful how she gets up to make sure that she does not get the spasms.  She had 2 prednisone tablets and a muscle relaxer leftover from last year.  She took those to try and help the muscle spasms.  After she took those, she noticed her heart rate increasing.  Her heart currently feels like it is pounding.  She has not had any irregular heartbeats, but her heart has been going to fast  and really pounding.  She has no history of chest pain with exertion and she did not have chest pain with a rapid heart rate.  She came to the emergency room to get help for the muscle spasms, they are so severe that she feels like she cannot move at all without bringing one on.  She has had a heart murmur for 35 years, she found out about it at the time of her daughter's birth.  When she saw Dr. Gery Pray for palpitations in 2019, he noted the murmur and did an echocardiogram.  Results are below.  It is most likely LVOT.   Past Medical History:  Diagnosis Date  . Back spasm   . Diabetes mellitus   . Hypertension   . Systolic ejection murmur 1986    Past Surgical History:  Procedure Laterality Date  . CESAREAN SECTION       Medications Prior to Admission: Prior to Admission medications   Medication Sig Start Date End Date Taking? Authorizing Provider  amLODipine (NORVASC) 10 MG tablet Take 10 mg by mouth daily. Reported on 12/18/2015    [provider]  atenolol (TENORMIN) 50 MG tablet Take 50 mg by mouth 2 (two) times daily.    [provider]  cetirizine (ZYRTEC) 10 MG chewable tablet Chew 10 mg by mouth 2 (two) times daily.  [provider]  clarithromycin (BIAXIN XL) 500 MG 24 hr tablet Take 1 tablet (500 mg total) by mouth 2 (two) times daily. 01/29/16   Bobbitt, Heywood Ilesalph Carter, MD  diphenhydrAMINE (BENADRYL) 25 MG tablet Take 25 mg by mouth. 1-2 tablets twice daily as needed.    [provider]  fluticasone (FLONASE) 50 MCG/ACT nasal spray USE ONE SPRAY IN EACH NOSTRIL ONCE OR TWICE DAILY AS NEEDED FOR STUFFY NOSE OR DRAINAGE 12/10/15   Bobbitt, Heywood Ilesalph Carter, MD  gabapentin (NEURONTIN) 100 MG capsule Take 3 capsules (300 mg total) by mouth 3 (three) times daily. 02/12/16   Levert FeinsteinYan, Yijun, MD  glimepiride (AMARYL) 4 MG tablet Take 4 mg by mouth daily with breakfast.    [provider]  levocetirizine (XYZAL) 5 MG tablet TAKE ONE TABLET ONCE DAILY  AS DIRECTED 09/28/16   Bobbitt, Heywood Ilesalph Carter, MD  lisinopril (PRINIVIL,ZESTRIL) 10 MG tablet Take 10 mg by mouth daily. Reported on 01/29/2016    [provider]  metFORMIN (GLUCOPHAGE-XR) 750 MG 24 hr tablet Take 750 mg by mouth 2 (two) times daily.    [provider]  methocarbamol (ROBAXIN) 500 MG tablet Take 1 tablet (500 mg total) by mouth 2 (two) times daily. 09/19/19   Mickie Bailate, Kelly H, NP  omeprazole (PRILOSEC) 20 MG capsule Take 1 capsule (20 mg total) by mouth 2 (two) times daily before a meal. 12/24/15 01/07/16  Bobbitt, Heywood Ilesalph Carter, MD  omeprazole (PRILOSEC) 20 MG capsule Take 1 capsule (20 mg total) by mouth daily. 01/09/16   Bobbitt, Heywood Ilesalph Carter, MD  omeprazole (PRILOSEC) 20 MG capsule Take 1 capsule (20 mg total) by mouth daily. 01/29/16   Bobbitt, Heywood Ilesalph Carter, MD  ranitidine (ZANTAC) 150 MG tablet Take 1 tablet (150 mg total) by mouth 2 (two) times daily. 12/18/15   Bobbitt, Heywood Ilesalph Carter, MD     Allergies:    Allergies  Allergen Reactions  . Ace Inhibitors Itching and Swelling  . Citrus Hives, Swelling and Other (See Comments)    Pt says she gets knots.   . Codeine Nausea And Vomiting    Excessive vomiting  . Lisinopril Hives  . Other Itching, Swelling and Other (See Comments)    Any type of nut Pt will get mouth blisters  . Percocet [Oxycodone-Acetaminophen]     Disoriented, difficulty waking up and hard to focus, vomiting  . Propoxyphene Other (See Comments)    Darvocet (Propoxyphene-Acetaminophen).  Causes Disorientation, difficulty waking up, difficulty focusing, and vomiting.  Alma Friendly. Januvia [Sitagliptin] Itching and Palpitations    Social History:   Social History   Socioeconomic History  . Marital status: Legally Separated    Spouse name: Not on file  . Number of children: Not on file  . Years of education: Not on file  . Highest education level: Not on file  Occupational History  . 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 Smoking status: Never Smoker  . Smokeless tobacco: Never Used  Substance and Sexual Activity  . Alcohol use: No    Alcohol/week: 0.0 standard drinks  . Drug use: Never  . Sexual activity: Not on file  Other Topics Concern  . Not on file  Social History Narrative  . Not on file   Social Determinants of Health   Financial Resource Strain:   . Difficulty of Paying Living Expenses:   Food Insecurity:   . Worried About Programme researcher, broadcasting/film/videounning Out of Food in the Last Year:   .  Ran Out of Food in the Last Year:   Transportation Needs:   . Film/video editor (Medical):   Marland Kitchen Lack of Transportation (Non-Medical):   Physical Activity:   . Days of Exercise per Week:   . Minutes of Exercise per Session:   Stress:   . Feeling of Stress :   Social Connections:   . Frequency of Communication with Friends and Family:   . Frequency of Social Gatherings with Friends and Family:   . Attends Religious Services:   . Active Member of Clubs or Organizations:   . Attends Archivist Meetings:   Marland Kitchen Marital Status:   Intimate Partner Violence:   . Fear of Current or Ex-Partner:   . Emotionally Abused:   Marland Kitchen Physically Abused:   . Sexually Abused:     Family History:  The patient's family history includes Asthma in her brother and father; CVA (age of onset: 69) in her father; Colon cancer (age of onset: 44) in her brother; Hypertension in her father and mother; Kidney cancer in her father. There is no history of Eczema, Immunodeficiency, or Urticaria.   The patient She indicated that the status of her mother is unknown. She indicated that her father is deceased. She indicated that her sister is alive. She indicated that her brother is deceased. She indicated that the status of her neg hx is unknown.   ROS:  Please see the history of present illness.  All other ROS reviewed and negative.     Physical Exam/Data:   Vitals:   03/08/20 0530 03/08/20 0629 03/08/20 0736 03/08/20 0800   BP: (!) 145/78  (!) 160/86 (!) 148/82  Pulse: 100 95 (!) 111 (!) 111  Resp: (!) 21 19 (!) 26 18  Temp:      TempSrc:      SpO2: 97% 97% 98% 99%  Weight:      Height:       No intake or output data in the 24 hours ending 03/08/20 0946 Filed Weights   03/07/20 2106  Weight: 63.5 kg   Body mass index is 24.8 kg/m.  General:  Well nourished, well developed, female in no acute distress HEENT: normal Lymph: no adenopathy Neck:  JVD not elevated Endocrine:  No thryomegaly Vascular: No carotid bruits; 4/4 extremity pulses 2+ bilaterally  Cardiac:  normal S1, S2; RRR; 2-3/6 murmur, no rub or gallop; no chest wall tenderness Lungs:  clear to auscultation bilaterally, no wheezing, rhonchi or rales  Abd: soft, nontender, no hepatomegaly  Ext: No edema Musculoskeletal:  No deformities, BUE and BLE strength normal and equal Skin: warm and dry  Neuro:  CNs 2-12 intact, no focal abnormalities noted Psych:  Normal affect    EKG:  The ECG was personally reviewed: 4/8/ 21:05 him ECG is sinus tachycardia, heart rate 130, some J-point elevation, mild and nonspecific 4/9 7:51 AM ECG is sinus tach, heart rate 114, some inferior and lateral J-point elevation, more pronounced than on earlier ECG Telemetry: Sinus tachycardia  Relevant CV Studies:  ECHO: 08/11/2018 - Left ventricle: The cavity size was normal. Systolic function was  normal. The estimated ejection fraction was in the range of 60%  to 65%. Wall motion was normal; there were no regional wall  motion abnormalities. There is accelerated flow through left  ventricular outflow tract. Likely cause of murmur. Doppler  parameters are consistent with abnormal left ventricular  relaxation (grade 1 diastolic dysfunction).  - Aortic valve: Trileaflet; mildly thickened, mildly calcified  leaflets. There was trivial regurgitation.  - Pulmonic valve: There was mild regurgitation.   CATH: Apr 03, 2008  by Dr. Chanda Busing.   Her EF was 60%.  She had normal coronary  arteries.  It showed normal renal arteries also.    Laboratory Data:  Chemistry Recent Labs  Lab 03/07/20 2118  NA 141  K 3.3*  CL 101  CO2 21*  GLUCOSE 218*  BUN 11  CREATININE 0.74  CALCIUM 10.2  GFRNONAA >60  GFRAA >60  ANIONGAP 19*    No results for input(s): PROT, ALBUMIN, AST, ALT, ALKPHOS, BILITOT in the last 168 hours. Hematology Recent Labs  Lab 03/07/20 2118  WBC 10.0  RBC 4.87  HGB 14.3  HCT 44.6  MCV 91.6  MCH 29.4  MCHC 32.1  RDW 13.8  PLT 348   Cardiac Enzymes  High Sensitivity Troponin:   Recent Labs  Lab 03/07/20 2118 03/07/20 2345 03/08/20 0815  TROPONINIHS 5 19* 174*     BNPNo results for input(s): BNP, PROBNP in the last 168 hours.  DDimer No results for input(s): DDIMER in the last 168 hours. Lipids:  Lab Results  Component Value Date   CHOL 143 03/08/2020   HDL 80 03/08/2020   LDLCALC 55 03/08/2020   LDLDIRECT 119.6 10/04/2007   TRIG 41 03/08/2020   CHOLHDL 1.8 03/08/2020   INR:  Lab Results  Component Value Date   INR 1.1 04/03/2008   A1c:  Lab Results  Component Value Date   HGBA1C 6.2 02/24/2011   Thyroid:  Lab Results  Component Value Date   TSH 0.869 08/05/2010   Lab Results  Component Value Date   ESRSEDRATE 40 12/10/2015    Radiology/Studies:  DG Chest 2 View  Result Date: 03/07/2020 CLINICAL DATA:  64 year old female with shortness of breath and tachycardia. EXAM: CHEST - 2 VIEW COMPARISON:  Chest radiographs 04/02/2008 and earlier. FINDINGS: Stable mild tortuosity of the thoracic aorta. Other mediastinal contours are within normal limits. Visualized tracheal air column is within normal limits. Lung volumes are stable and within normal limits. Both lungs appear clear. Negative visible bowel gas pattern and osseous structures. IMPRESSION: Negative.  No acute cardiopulmonary abnormality. Electronically Signed   By: Odessa Fleming M.D.   On: 03/07/2020 21:30   CT ANGIO CHEST  PE W OR WO CONTRAST  Result Date: 03/08/2020 CLINICAL DATA:  Shortness of breath. EXAM: CT ANGIOGRAPHY CHEST WITH CONTRAST TECHNIQUE: Multidetector CT imaging of the chest was performed using the standard protocol during bolus administration of intravenous contrast. Multiplanar CT image reconstructions and MIPs were obtained to evaluate the vascular anatomy. CONTRAST:  OMNIPAQUE IOHEXOL 350 MG/ML SOLN COMPARISON:  None. FINDINGS: Cardiovascular: Satisfactory opacification of the pulmonary arteries to the segmental level. No evidence of pulmonary embolism. A mild amount of artifact is suspected along the a posteromedial lower branch of the left pulmonary artery. Normal heart size. No pericardial effusion. Mediastinum/Nodes: No enlarged mediastinal, hilar, or axillary lymph nodes. Thyroid gland, trachea, and esophagus demonstrate no significant findings. Lungs/Pleura: Very mild posterior bilateral lower lobe atelectasis seen. There is no evidence of acute infiltrate, pleural effusion or pneumothorax. Upper Abdomen: There is a small hiatal hernia. Musculoskeletal: No chest wall abnormality. No acute or significant osseous findings. Review of the MIP images confirms the above findings. IMPRESSION: 1. No evidence of pulmonary embolus. 2. Very mild posterior bilateral lower lobe atelectasis. 3. Small hiatal hernia. Electronically Signed   By: Aram Candela M.D.   On: 03/08/2020 03:19  Assessment and Plan:   1.  Tachycardia -Symptoms began after she took 2 prednisone tablets she had previously been prescribed. -She has been on atenolol for years, but feels she needs more powerful beta-blockade, will change to metoprolol 50 mg twice daily -Check a TSH  2.  Elevated troponin -She has some diffuse ST/J-point elevation that is new from last night -She is not currently having any chest pain and has no history of chest pain with exertion -Check stat echo, if any wall motion abnormalities or her EF is  decreased, she will need a cath -Make sure she has had aspirin 324 mg and start heparin. -Keep n.p.o. for now, further evaluation and treatment depending on results of the echo -Check a sed rate, CRP  Otherwise, per IM Active Problems:   Chest pain     For questions or updates, please contact [facility] Please consult [email or web address] for contact info under Cardiology/STEMI.    SignedTheodore Demark, PA-C  03/08/2020 9:46 AM

## 2020-03-08 NOTE — H&P (View-Only) (Signed)
Cardiology Consult:   Patient ID: [name]; MRN: [id number]; DOB: Nov 17, 1956   Admission date: 03/07/2020  Primary Care Provider: [name], MD Primary Cardiologist: Nanetta Batty, MD 05/10/2018 Primary Electrophysiologist: None    Chief Complaint:  Chest pain  Patient Profile:   Sharon Munoz is a 64 y.o. female with a history of DM, HTN, muscle spasms, SEM dx 1986, palpitations.   She came to the ER with back pain and tachycardia. ECG appears abnl, cards asked to see by Dr Ophelia Charter.  History of Present Illness:   Sharon Munoz was in her usual state of health until recently.  She exercises regularly, taking her 65-year-old to the park.  She walks a mile at a time for exercise.  She has no history of chest pain or new shortness of breath with exertion.  Recently, she was placed on Januvia.  She did not tolerate that, so she was allergic to it and it was discontinued.  Recently, she has been having trouble with allergies.  She said knots would come up every day on different places on her mouth or face, one was on the right side of her throat.  She has been treated with prednisone in the past for these.  She has also had itching in her hands.  She had problems with muscle spasms last year.  She was treated with prednisone and muscle relaxers.  Yesterday, she started having muscle spasms again.  They are in the right side of her back.  They go from down below her waist all the way up into her shoulder.  They are aggravated by certain movements.  If she is sitting still, she has to be extremely careful how she gets up to make sure that she does not get the spasms.  She had 2 prednisone tablets and a muscle relaxer leftover from last year.  She took those to try and help the muscle spasms.  After she took those, she noticed her heart rate increasing.  Her heart currently feels like it is pounding.  She has not had any irregular heartbeats, but her heart has been going to fast  and really pounding.  She has no history of chest pain with exertion and she did not have chest pain with a rapid heart rate.  She came to the emergency room to get help for the muscle spasms, they are so severe that she feels like she cannot move at all without bringing one on.  She has had a heart murmur for 35 years, she found out about it at the time of her daughter's birth.  When she saw Dr. Gery Pray for palpitations in 2019, he noted the murmur and did an echocardiogram.  Results are below.  It is most likely LVOT.   Past Medical History:  Diagnosis Date  . Back spasm   . Diabetes mellitus   . Hypertension   . Systolic ejection murmur 1986    Past Surgical History:  Procedure Laterality Date  . CESAREAN SECTION       Medications Prior to Admission: Prior to Admission medications   Medication Sig Start Date End Date Taking? Authorizing Provider  amLODipine (NORVASC) 10 MG tablet Take 10 mg by mouth daily. Reported on 12/18/2015    [provider]  atenolol (TENORMIN) 50 MG tablet Take 50 mg by mouth 2 (two) times daily.    [provider]  cetirizine (ZYRTEC) 10 MG chewable tablet Chew 10 mg by mouth 2 (two) times daily.  [provider]  clarithromycin (BIAXIN XL) 500 MG 24 hr tablet Take 1 tablet (500 mg total) by mouth 2 (two) times daily. 01/29/16   Bobbitt, Ralph Carter, MD  diphenhydrAMINE (BENADRYL) 25 MG tablet Take 25 mg by mouth. 1-2 tablets twice daily as needed.    [provider]  fluticasone (FLONASE) 50 MCG/ACT nasal spray USE ONE SPRAY IN EACH NOSTRIL ONCE OR TWICE DAILY AS NEEDED FOR STUFFY NOSE OR DRAINAGE 12/10/15   Bobbitt, Ralph Carter, MD  gabapentin (NEURONTIN) 100 MG capsule Take 3 capsules (300 mg total) by mouth 3 (three) times daily. 02/12/16   Yan, Yijun, MD  glimepiride (AMARYL) 4 MG tablet Take 4 mg by mouth daily with breakfast.    [provider]  levocetirizine (XYZAL) 5 MG tablet TAKE ONE TABLET ONCE DAILY  AS DIRECTED 09/28/16   Bobbitt, Ralph Carter, MD  lisinopril (PRINIVIL,ZESTRIL) 10 MG tablet Take 10 mg by mouth daily. Reported on 01/29/2016    [provider]  metFORMIN (GLUCOPHAGE-XR) 750 MG 24 hr tablet Take 750 mg by mouth 2 (two) times daily.    [provider]  methocarbamol (ROBAXIN) 500 MG tablet Take 1 tablet (500 mg total) by mouth 2 (two) times daily. 09/19/19   Tate, Kelly H, NP  omeprazole (PRILOSEC) 20 MG capsule Take 1 capsule (20 mg total) by mouth 2 (two) times daily before a meal. 12/24/15 01/07/16  Bobbitt, Ralph Carter, MD  omeprazole (PRILOSEC) 20 MG capsule Take 1 capsule (20 mg total) by mouth daily. 01/09/16   Bobbitt, Ralph Carter, MD  omeprazole (PRILOSEC) 20 MG capsule Take 1 capsule (20 mg total) by mouth daily. 01/29/16   Bobbitt, Ralph Carter, MD  ranitidine (ZANTAC) 150 MG tablet Take 1 tablet (150 mg total) by mouth 2 (two) times daily. 12/18/15   Bobbitt, Ralph Carter, MD     Allergies:    Allergies  Allergen Reactions  . Ace Inhibitors Itching and Swelling  . Citrus Hives, Swelling and Other (See Comments)    Pt says she gets knots.   . Codeine Nausea And Vomiting    Excessive vomiting  . Lisinopril Hives  . Other Itching, Swelling and Other (See Comments)    Any type of nut Pt will get mouth blisters  . Percocet [Oxycodone-Acetaminophen]     Disoriented, difficulty waking up and hard to focus, vomiting  . Propoxyphene Other (See Comments)    Darvocet (Propoxyphene-Acetaminophen).  Causes Disorientation, difficulty waking up, difficulty focusing, and vomiting.  . Januvia [Sitagliptin] Itching and Palpitations    Social History:   Social History   Socioeconomic History  . Marital status: Legally Separated    Spouse name: Not on file  . Number of children: Not on file  . Years of education: Not on file  . Highest education level: Not on file  Occupational History  . 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 Smoking status: Never Smoker  . Smokeless tobacco: Never Used  Substance and Sexual Activity  . Alcohol use: No    Alcohol/week: 0.0 standard drinks  . Drug use: Never  . Sexual activity: Not on file  Other Topics Concern  . Not on file  Social History Narrative  . Not on file   Social Determinants of Health   Financial Resource Strain:   . Difficulty of Paying Living Expenses:   Food Insecurity:   . Worried About Running Out of Food in the Last Year:   .   Ran Out of Food in the Last Year:   Transportation Needs:   . Lack of Transportation (Medical):   . Lack of Transportation (Non-Medical):   Physical Activity:   . Days of Exercise per Week:   . Minutes of Exercise per Session:   Stress:   . Feeling of Stress :   Social Connections:   . Frequency of Communication with Friends and Family:   . Frequency of Social Gatherings with Friends and Family:   . Attends Religious Services:   . Active Member of Clubs or Organizations:   . Attends Club or Organization Meetings:   . Marital Status:   Intimate Partner Violence:   . Fear of Current or Ex-Partner:   . Emotionally Abused:   . Physically Abused:   . Sexually Abused:     Family History:  The patient's family history includes Asthma in her brother and father; CVA (age of onset: 90) in her father; Colon cancer (age of onset: 53) in her brother; Hypertension in her father and mother; Kidney cancer in her father. There is no history of Eczema, Immunodeficiency, or Urticaria.   The patient She indicated that the status of her mother is unknown. She indicated that her father is deceased. She indicated that her sister is alive. She indicated that her brother is deceased. She indicated that the status of her neg hx is unknown.   ROS:  Please see the history of present illness.  All other ROS reviewed and negative.     Physical Exam/Data:   Vitals:   03/08/20 0530 03/08/20 0629 03/08/20 0736 03/08/20 0800   BP: (!) 145/78  (!) 160/86 (!) 148/82  Pulse: 100 95 (!) 111 (!) 111  Resp: (!) 21 19 (!) 26 18  Temp:      TempSrc:      SpO2: 97% 97% 98% 99%  Weight:      Height:       No intake or output data in the 24 hours ending 03/08/20 0946 Filed Weights   03/07/20 2106  Weight: 63.5 kg   Body mass index is 24.8 kg/m.  General:  Well nourished, well developed, female in no acute distress HEENT: normal Lymph: no adenopathy Neck:  JVD not elevated Endocrine:  No thryomegaly Vascular: No carotid bruits; 4/4 extremity pulses 2+ bilaterally  Cardiac:  normal S1, S2; RRR; 2-3/6 murmur, no rub or gallop; no chest wall tenderness Lungs:  clear to auscultation bilaterally, no wheezing, rhonchi or rales  Abd: soft, nontender, no hepatomegaly  Ext: No edema Musculoskeletal:  No deformities, BUE and BLE strength normal and equal Skin: warm and dry  Neuro:  CNs 2-12 intact, no focal abnormalities noted Psych:  Normal affect    EKG:  The ECG was personally reviewed: 4/8/ 21:05 him ECG is sinus tachycardia, heart rate 130, some J-point elevation, mild and nonspecific 4/9 7:51 AM ECG is sinus tach, heart rate 114, some inferior and lateral J-point elevation, more pronounced than on earlier ECG Telemetry: Sinus tachycardia  Relevant CV Studies:  ECHO: 08/11/2018 - Left ventricle: The cavity size was normal. Systolic function was  normal. The estimated ejection fraction was in the range of 60%  to 65%. Wall motion was normal; there were no regional wall  motion abnormalities. There is accelerated flow through left  ventricular outflow tract. Likely cause of murmur. Doppler  parameters are consistent with abnormal left ventricular  relaxation (grade 1 diastolic dysfunction).  - Aortic valve: Trileaflet; mildly thickened, mildly calcified    leaflets. There was trivial regurgitation.  - Pulmonic valve: There was mild regurgitation.   CATH: Apr 03, 2008  by Dr. William Gamble.   Her EF was 60%.  She had normal coronary  arteries.  It showed normal renal arteries also.    Laboratory Data:  Chemistry Recent Labs  Lab 03/07/20 2118  NA 141  K 3.3*  CL 101  CO2 21*  GLUCOSE 218*  BUN 11  CREATININE 0.74  CALCIUM 10.2  GFRNONAA >60  GFRAA >60  ANIONGAP 19*    No results for input(s): PROT, ALBUMIN, AST, ALT, ALKPHOS, BILITOT in the last 168 hours. Hematology Recent Labs  Lab 03/07/20 2118  WBC 10.0  RBC 4.87  HGB 14.3  HCT 44.6  MCV 91.6  MCH 29.4  MCHC 32.1  RDW 13.8  PLT 348   Cardiac Enzymes  High Sensitivity Troponin:   Recent Labs  Lab 03/07/20 2118 03/07/20 2345 03/08/20 0815  TROPONINIHS 5 19* 174*     BNPNo results for input(s): BNP, PROBNP in the last 168 hours.  DDimer No results for input(s): DDIMER in the last 168 hours. Lipids:  Lab Results  Component Value Date   CHOL 143 03/08/2020   HDL 80 03/08/2020   LDLCALC 55 03/08/2020   LDLDIRECT 119.6 10/04/2007   TRIG 41 03/08/2020   CHOLHDL 1.8 03/08/2020   INR:  Lab Results  Component Value Date   INR 1.1 04/03/2008   A1c:  Lab Results  Component Value Date   HGBA1C 6.2 02/24/2011   Thyroid:  Lab Results  Component Value Date   TSH 0.869 08/05/2010   Lab Results  Component Value Date   ESRSEDRATE 40 12/10/2015    Radiology/Studies:  DG Chest 2 View  Result Date: 03/07/2020 CLINICAL DATA:  63-year-old female with shortness of breath and tachycardia. EXAM: CHEST - 2 VIEW COMPARISON:  Chest radiographs 04/02/2008 and earlier. FINDINGS: Stable mild tortuosity of the thoracic aorta. Other mediastinal contours are within normal limits. Visualized tracheal air column is within normal limits. Lung volumes are stable and within normal limits. Both lungs appear clear. Negative visible bowel gas pattern and osseous structures. IMPRESSION: Negative.  No acute cardiopulmonary abnormality. Electronically Signed   By: H  Hall M.D.   On: 03/07/2020 21:30   CT ANGIO CHEST  PE W OR WO CONTRAST  Result Date: 03/08/2020 CLINICAL DATA:  Shortness of breath. EXAM: CT ANGIOGRAPHY CHEST WITH CONTRAST TECHNIQUE: Multidetector CT imaging of the chest was performed using the standard protocol during bolus administration of intravenous contrast. Multiplanar CT image reconstructions and MIPs were obtained to evaluate the vascular anatomy. CONTRAST:  100mL OMNIPAQUE IOHEXOL 350 MG/ML SOLN COMPARISON:  None. FINDINGS: Cardiovascular: Satisfactory opacification of the pulmonary arteries to the segmental level. No evidence of pulmonary embolism. A mild amount of artifact is suspected along the a posteromedial lower branch of the left pulmonary artery. Normal heart size. No pericardial effusion. Mediastinum/Nodes: No enlarged mediastinal, hilar, or axillary lymph nodes. Thyroid gland, trachea, and esophagus demonstrate no significant findings. Lungs/Pleura: Very mild posterior bilateral lower lobe atelectasis seen. There is no evidence of acute infiltrate, pleural effusion or pneumothorax. Upper Abdomen: There is a small hiatal hernia. Musculoskeletal: No chest wall abnormality. No acute or significant osseous findings. Review of the MIP images confirms the above findings. IMPRESSION: 1. No evidence of pulmonary embolus. 2. Very mild posterior bilateral lower lobe atelectasis. 3. Small hiatal hernia. Electronically Signed   By: Thaddeus  Houston M.D.   On: 03/08/2020 03:19      Assessment and Plan:   1.  Tachycardia -Symptoms began after she took 2 prednisone tablets she had previously been prescribed. -She has been on atenolol for years, but feels she needs more powerful beta-blockade, will change to metoprolol 50 mg twice daily -Check a TSH  2.  Elevated troponin -She has some diffuse ST/J-point elevation that is new from last night -She is not currently having any chest pain and has no history of chest pain with exertion -Check stat echo, if any wall motion abnormalities or her EF is  decreased, she will need a cath -Make sure she has had aspirin 324 mg and start heparin. -Keep n.p.o. for now, further evaluation and treatment depending on results of the echo -Check a sed rate, CRP  Otherwise, per IM Active Problems:   Chest pain     For questions or updates, please contact [facility] Please consult [email or web address] for contact info under Cardiology/STEMI.    Signed, Alain Deschene, PA-C  03/08/2020 9:46 AM    

## 2020-03-08 NOTE — ED Notes (Signed)
Date and time results received: 03/08/20   Test: Troponin Critical Value: 174 ng/l  Name of Provider Notified: Ophelia Charter MD

## 2020-03-08 NOTE — Progress Notes (Addendum)
Echocardiogram 2D Echocardiogram has been performed.  Sharon Munoz 03/08/2020, 10:30 AM   Dr. Eden Emms present for exam.

## 2020-03-08 NOTE — Progress Notes (Signed)
ANTICOAGULATION CONSULT NOTE - Initial Consult  Pharmacy Consult for Heparin Indication: chest pain/ACS  Allergies  Allergen Reactions  . Ace Inhibitors Itching and Swelling  . Citrus Hives, Swelling and Other (See Comments)    Pt says she gets knots.   . Codeine Nausea And Vomiting    Excessive vomiting  . Lisinopril Hives  . Other Itching, Swelling and Other (See Comments)    Any type of nut Pt will get mouth blisters  . Percocet [Oxycodone-Acetaminophen]     Disoriented, difficulty waking up and hard to focus, vomiting  . Propoxyphene Other (See Comments)    Darvocet (Propoxyphene-Acetaminophen).  Causes Disorientation, difficulty waking up, difficulty focusing, and vomiting.  Alma Friendly [Sitagliptin] Itching and Palpitations    Patient Measurements: Height: 5\' 3"  (160 cm) Weight: 63.5 kg (140 lb) IBW/kg (Calculated) : 52.4 Heparin Dosing Weight: 63.5 kg  Vital Signs: BP: 148/82 (04/09 0800) Pulse Rate: 111 (04/09 0800)  Labs: Recent Labs    03/07/20 2118 03/07/20 2345 03/08/20 0815  HGB 14.3  --   --   HCT 44.6  --   --   PLT 348  --   --   CREATININE 0.74  --   --   TROPONINIHS 5 19* 174*    Estimated Creatinine Clearance: 64.5 mL/min (by C-G formula based on SCr of 0.74 mg/dL).   Medical History: Past Medical History:  Diagnosis Date  . Back spasm   . Diabetes mellitus   . Hypertension   . Systolic ejection murmur 1986    Medications:  (Not in a hospital admission)   Assessment: 1 YOF who presented on 4/9 with CP and concern for ACS. Pharmacy consulted to dose Heparin for anticoagulation.   The patient was not noted to have an indication for and/or be on any anticoagulation PTA. Baseline CBC wnl  Goal of Therapy:  Heparin level 0.3-0.7 units/ml Monitor platelets by anticoagulation protocol: Yes   Plan:  - Heparin 3500 unit bolus x 1 - Start Heparin at a rate of 750 units/hr - Will continue to monitor for any signs/symptoms of bleeding  and will follow up with heparin level in 6 hours  Thank you for allowing pharmacy to be a part of this patient's care.  6/9, PharmD, BCPS Clinical Pharmacist Clinical phone for 03/08/2020: [phone] 03/08/2020 9:59 AM   **Pharmacist phone directory can now be found on [email or web address] ([email or web address]).  Listed under [facility].

## 2020-03-08 NOTE — Brief Op Note (Signed)
BRIEF CARDIAC CATHETERIZATION NOTE  03/08/2020  5:34 PM  PATIENT:  Sharon Munoz  64 y.o. female  PRE-OPERATIVE DIAGNOSIS:  elevated troponin  POST-OPERATIVE DIAGNOSIS:  * No post-op diagnosis entered *  PROCEDURE:  Procedure(s): RIGHT/LEFT HEART CATH AND CORONARY ANGIOGRAPHY (N/A)  SURGEON:  Surgeon(s) and Role:    * Brittan Butterbaugh, Cristal Deer, MD - Primary  FINDINGS: 1. Tortuous coronary arteries without angiographically significant stenosis. 2. Normal left and right heart filling pressures. 3. Normal cardiac output/index. 4. Dynamic LVOT gradient of 25 mmHg at rest and 65-70 mmHg post-PVC.  RECOMMENDATIONS: 1. Primary prevention of coronary artery disease. 2. Hold amlodipine and increase metoprolol tartrate to 100 mg BID to improve rate control.  Yvonne Kendall, MD [facility]

## 2020-03-08 NOTE — ED Provider Notes (Signed)
[facility] EMERGENCY DEPARTMENT Provider Note   CSN: [id number] Arrival date & time: 03/07/20  2051     History Chief Complaint  Patient presents with  . Tachycardia    Sharon Munoz is a 64 y.o. female.  Patient referred to the emergency department from urgent care.  She originally presented to urgent care earlier today because of intermittent sharp stabbing pain in the right upper back.  She reports that this seemed to be related to movements.  She would have no pain for a while and then when she would move a certain way she would get a "catch" and then a sharp pain.  Pain is not currently there.  While at the urgent care center, however, she was noted to have tachycardia and was referred to the ER for further evaluation.  She notices the racing heartbeat and also an uncomfortable feeling across her chest.  She does not feel short of breath.  Patient does report that she has been dealing with a possible allergic reaction this week.  She saw her primary doctor because she has been having intermittent swelling of the face, hands and itching all over.  She does have multiple drug allergies.  She was scheduled to follow-up with an allergist by her primary doctor.     HPI: A 64 year old patient with a history of treated diabetes, hypertension and hypercholesterolemia presents for evaluation of chest pain. Initial onset of pain was more than 6 hours ago. The patient's chest pain is not worse with exertion. The patient's chest pain is not middle- or left-sided, is not well-localized, is not described as heaviness/pressure/tightness, is not sharp and does not radiate to the arms/jaw/neck. The patient does not complain of nausea and denies diaphoresis. The patient has no history of stroke, has no history of peripheral artery disease, has not smoked in the past 90 days, has no relevant family history of coronary artery disease (first degree relative at less than age 47) and does  not have an elevated BMI (>=30).   Past Medical History:  Diagnosis Date  . Diabetes mellitus   . Hypertension     Patient Active Problem List   Diagnosis Date Noted  . Palpitations 05/10/2018  . Bilateral hand pain 02/12/2016  . H. pylori infection 01/29/2016  . Chronic urticaria 12/10/2015  . Angioedema 12/10/2015  . Chronic rhinitis 12/10/2015  . WRIST PAIN, RIGHT 03/05/2009  . HYPOMAGNESEMIA 10/05/2008  . CATARACTS, BILATERAL 08/24/2008  . Cardiac murmur 08/24/2008  . CARPAL TUNNEL SYNDROME, BILATERAL 08/23/2008  . HYPERLIPIDEMIA 10/20/2007  . HYPOKALEMIA 10/20/2007  . OTHER ACUTE REACTIONS TO STRESS 10/04/2007  . GERD 06/07/2007  . POSTMENOPAUSAL STATUS 11/30/2006  . Diabetes mellitus (HCC) 11/30/2005  . HYPERTENSION 11/30/2002    No past surgical history on file.   OB History   No obstetric history on file.     Family History  Problem Relation Age of Onset  . Asthma Father   . Hypertension Father   . Kidney cancer Father   . Asthma Brother   . Hypertension Mother   . Eczema Neg Hx   . Immunodeficiency Neg Hx   . Urticaria Neg Hx     Social History   Tobacco Use  . Smoking status: Never Smoker  . Smokeless tobacco: Never Used  Substance Use Topics  . Alcohol use: No    Alcohol/week: 0.0 standard drinks  . Drug use: Not on file    Home Medications Prior to Admission medications  Medication Sig Start Date End Date Taking? Authorizing Provider  amLODipine (NORVASC) 10 MG tablet Take 10 mg by mouth daily. Reported on 12/18/2015    [provider]  atenolol (TENORMIN) 50 MG tablet Take 50 mg by mouth 2 (two) times daily.    [provider]  cetirizine (ZYRTEC) 10 MG chewable tablet Chew 10 mg by mouth 2 (two) times daily.    [provider]  clarithromycin (BIAXIN XL) 500 MG 24 hr tablet Take 1 tablet (500 mg total) by mouth 2 (two) times daily. 01/29/16   Bobbitt, Sedalia Muta, MD  diphenhydrAMINE (BENADRYL) 25 MG tablet  Take 25 mg by mouth. 1-2 tablets twice daily as needed.    [provider]  fluticasone (FLONASE) 50 MCG/ACT nasal spray USE ONE SPRAY IN EACH NOSTRIL ONCE OR TWICE DAILY AS NEEDED FOR STUFFY NOSE OR DRAINAGE 12/10/15   Bobbitt, Sedalia Muta, MD  gabapentin (NEURONTIN) 100 MG capsule Take 3 capsules (300 mg total) by mouth 3 (three) times daily. 02/12/16   Marcial Pacas, MD  glimepiride (AMARYL) 4 MG tablet Take 4 mg by mouth daily with breakfast.    [provider]  levocetirizine (XYZAL) 5 MG tablet TAKE ONE TABLET ONCE DAILY AS DIRECTED 09/28/16   Bobbitt, Sedalia Muta, MD  lisinopril (PRINIVIL,ZESTRIL) 10 MG tablet Take 10 mg by mouth daily. Reported on 01/29/2016    [provider]  metFORMIN (GLUCOPHAGE-XR) 750 MG 24 hr tablet Take 750 mg by mouth 2 (two) times daily.    [provider]  methocarbamol (ROBAXIN) 500 MG tablet Take 1 tablet (500 mg total) by mouth 2 (two) times daily. 09/19/19   Sharion Balloon, NP  omeprazole (PRILOSEC) 20 MG capsule Take 1 capsule (20 mg total) by mouth 2 (two) times daily before a meal. 12/24/15 01/07/16  Bobbitt, Sedalia Muta, MD  omeprazole (PRILOSEC) 20 MG capsule Take 1 capsule (20 mg total) by mouth daily. 01/09/16   Bobbitt, Sedalia Muta, MD  omeprazole (PRILOSEC) 20 MG capsule Take 1 capsule (20 mg total) by mouth daily. 01/29/16   Bobbitt, Sedalia Muta, MD  ranitidine (ZANTAC) 150 MG tablet Take 1 tablet (150 mg total) by mouth 2 (two) times daily. 12/18/15   Bobbitt, Sedalia Muta, MD    Allergies    Ace inhibitors, Codeine, Lisinopril, Percocet [oxycodone-acetaminophen], Propoxyphene, and Januvia [sitagliptin]  Review of Systems   Review of Systems  Respiratory: Negative for shortness of breath.   Cardiovascular: Positive for palpitations. Negative for chest pain.  Musculoskeletal: Positive for back pain.  All other systems reviewed and are negative.   Physical Exam Updated Vital Signs BP (!) 165/82   Pulse (!) 112    Temp 98.8 F (37.1 C) (Oral)   Resp 20   Ht 5\' 3"  (1.6 m)   Wt 63.5 kg   SpO2 98%   BMI 24.80 kg/m   Physical Exam Vitals and nursing note reviewed.  Constitutional:      General: She is not in acute distress.    Appearance: Normal appearance. She is well-developed.  HENT:     Head: Normocephalic and atraumatic.     Right Ear: Hearing normal.     Left Ear: Hearing normal.     Nose: Nose normal.  Eyes:     Conjunctiva/sclera: Conjunctivae normal.     Pupils: Pupils are equal, round, and reactive to light.  Cardiovascular:     Rate and Rhythm: Regular rhythm. Tachycardia present.     Heart sounds: S1 normal  and S2 normal. No murmur. No friction rub. No gallop.   Pulmonary:     Effort: Pulmonary effort is normal. No respiratory distress.     Breath sounds: Normal breath sounds.  Chest:     Chest wall: No tenderness.  Abdominal:     General: Bowel sounds are normal.     Palpations: Abdomen is soft.     Tenderness: There is no abdominal tenderness. There is no guarding or rebound. Negative signs include Murphy's sign and McBurney's sign.     Hernia: No hernia is present.  Musculoskeletal:        General: Normal range of motion.     Cervical back: Normal range of motion and neck supple.  Skin:    General: Skin is warm and dry.     Findings: No rash.  Neurological:     Mental Status: She is alert and oriented to person, place, and time.     GCS: GCS eye subscore is 4. GCS verbal subscore is 5. GCS motor subscore is 6.     Cranial Nerves: No cranial nerve deficit.     Sensory: No sensory deficit.     Coordination: Coordination normal.  Psychiatric:        Speech: Speech normal.        Behavior: Behavior normal.        Thought Content: Thought content normal.     ED Results / Procedures / Treatments   Labs (all labs ordered are listed, but only abnormal results are displayed) Labs Reviewed  BASIC METABOLIC PANEL - Abnormal; Notable for the following components:       Result Value   Potassium 3.3 (*)    CO2 21 (*)    Glucose, Bld 218 (*)    Anion gap 19 (*)    All other components within normal limits  TROPONIN I (HIGH SENSITIVITY) - Abnormal; Notable for the following components:   Troponin I (High Sensitivity) 19 (*)    All other components within normal limits  SARS CORONAVIRUS 2 (TAT 6-24 HRS)  CBC  TROPONIN I (HIGH SENSITIVITY)    EKG EKG Interpretation  Date/Time:  Friday March 08 2020 03:52:52 EDT Ventricular Rate:  108 PR Interval:    QRS Duration: 81 QT Interval:  352 QTC Calculation: 472 R Axis:   -6 Text Interpretation: Sinus tachycardia Consider right atrial enlargement Probable anteroseptal infarct, old Confirmed by Gilda Crease [id number]) on 03/08/2020 4:02:07 AM   Radiology DG Chest 2 View  Result Date: 03/07/2020 CLINICAL DATA:  63 year old female with shortness of breath and tachycardia. EXAM: CHEST - 2 VIEW COMPARISON:  Chest radiographs 04/02/2008 and earlier. FINDINGS: Stable mild tortuosity of the thoracic aorta. Other mediastinal contours are within normal limits. Visualized tracheal air column is within normal limits. Lung volumes are stable and within normal limits. Both lungs appear clear. Negative visible bowel gas pattern and osseous structures. IMPRESSION: Negative.  No acute cardiopulmonary abnormality. Electronically Signed   By: Odessa Fleming M.D.   On: 03/07/2020 21:30   CT ANGIO CHEST PE W OR WO CONTRAST  Result Date: 03/08/2020 CLINICAL DATA:  Shortness of breath. EXAM: CT ANGIOGRAPHY CHEST WITH CONTRAST TECHNIQUE: Multidetector CT imaging of the chest was performed using the standard protocol during bolus administration of intravenous contrast. Multiplanar CT image reconstructions and MIPs were obtained to evaluate the vascular anatomy. CONTRAST:  OMNIPAQUE IOHEXOL 350 MG/ML SOLN COMPARISON:  None. FINDINGS: Cardiovascular: Satisfactory opacification of the pulmonary arteries to the segmental level.  No  evidence of pulmonary embolism. A mild amount of artifact is suspected along the a posteromedial lower branch of the left pulmonary artery. Normal heart size. No pericardial effusion. Mediastinum/Nodes: No enlarged mediastinal, hilar, or axillary lymph nodes. Thyroid gland, trachea, and esophagus demonstrate no significant findings. Lungs/Pleura: Very mild posterior bilateral lower lobe atelectasis seen. There is no evidence of acute infiltrate, pleural effusion or pneumothorax. Upper Abdomen: There is a small hiatal hernia. Musculoskeletal: No chest wall abnormality. No acute or significant osseous findings. Review of the MIP images confirms the above findings. IMPRESSION: 1. No evidence of pulmonary embolus. 2. Very mild posterior bilateral lower lobe atelectasis. 3. Small hiatal hernia. Electronically Signed   By: Aram Candela M.D.   On: 03/08/2020 03:19    Procedures Procedures (including critical care time)  Medications Ordered in ED Medications  sodium chloride flush (NS) 0.9 % injection 3 mL (has no administration in time range)  sodium chloride 0.9 % bolus 1,000 mL (1,000 mLs Intravenous New Bag/Given 03/08/20 0223)  LORazepam (ATIVAN) injection 0.5 mg (0.5 mg Intravenous Given 03/08/20 0223)  iohexol (OMNIPAQUE) 350 MG/ML injection 100 mL (100 mLs Intravenous Contrast Given 03/08/20 0240)    ED Course  I have reviewed the triage vital signs and the nursing notes.  Pertinent labs & imaging results that were available during my care of the patient were reviewed by me and considered in my medical decision making (see chart for details).    MDM Rules/Calculators/A&P HEAR Score: 4                    Patient referred to the emergency department for evaluation of tachycardia.  Patient had originally presented to urgent care with complaints of pain in the right upper back that were intermittent and seem to be related to position changes and movement.  While at urgent care, however, she was  noted to have a significant tachycardia.  Here in the ER tachycardia persists.  She endorses discomfort across the anterior chest, no shortness of breath.  She does seem somewhat anxious.  Tachycardia did improve with Ativan and IV fluids.  She underwent CT scan, no evidence of PE or other acute abnormality.  Patient second troponin is mildly elevated at 19 with a delta of 14.  She has a heart score of 4 and per algorithm, it is recommended she be admitted for further observation.  Final Clinical Impression(s) / ED Diagnoses Final diagnoses:  Palpitations  Chest pain, unspecified type    Rx / DC Orders ED Discharge Orders    None       Katilin Raynes, Canary Brim, MD 03/08/20 [phone]

## 2020-03-08 NOTE — Interval H&P Note (Signed)
History and Physical Interval Note:  03/08/2020 4:09 PM  Sharon Munoz  has presented today for surgery, with the diagnosis of elevated troponin.  The various methods of treatment have been discussed with the patient and family. After consideration of risks, benefits and other options for treatment, the patient has consented to  Procedure(s): RIGHT/LEFT HEART CATH AND CORONARY ANGIOGRAPHY (N/A) as a surgical intervention.  The patient's history has been reviewed, patient examined, no change in status, stable for surgery.  I have reviewed the patient's chart and labs.  Questions were answered to the patient's satisfaction.    Cath Lab Visit (complete for each Cath Lab visit)  Clinical Evaluation Leading to the Procedure:   ACS: Yes.    Non-ACS:  N/A  Germani Gavilanes

## 2020-03-09 DIAGNOSIS — I10 Essential (primary) hypertension: Secondary | ICD-10-CM

## 2020-03-09 DIAGNOSIS — I5181 Takotsubo syndrome: Principal | ICD-10-CM

## 2020-03-09 DIAGNOSIS — E785 Hyperlipidemia, unspecified: Secondary | ICD-10-CM

## 2020-03-09 LAB — GLUCOSE, CAPILLARY
Glucose-Capillary: 177 mg/dL — ABNORMAL HIGH (ref 70–99)
Glucose-Capillary: 254 mg/dL — ABNORMAL HIGH (ref 70–99)
Glucose-Capillary: 204 mg/dL — ABNORMAL HIGH (ref 70–99)

## 2020-03-09 LAB — BASIC METABOLIC PANEL
Anion gap: 11 (ref 5–15)
BUN: 13 mg/dL (ref 8–23)
CO2: 20 mmol/L — ABNORMAL LOW (ref 22–32)
Calcium: 9.2 mg/dL (ref 8.9–10.3)
Chloride: 109 mmol/L (ref 98–111)
Creatinine, Ser: 0.61 mg/dL (ref 0.44–1.00)
GFR calc Af Amer: >60 mL/min (ref >60)
GFR calc non Af Amer: >60 mL/min (ref >60)
Glucose, Bld: 338 mg/dL — ABNORMAL HIGH (ref 70–99)
Potassium: 3.6 mmol/L (ref 3.5–5.1)
Sodium: 140 mmol/L (ref 135–145)

## 2020-03-09 LAB — CBC
HCT: 40.5 % (ref 36.0–46.0)
Hemoglobin: 13.2 g/dL (ref 12.0–15.0)
MCH: 29.7 pg (ref 26.0–34.0)
MCHC: 32.6 g/dL (ref 30.0–36.0)
MCV: 91.2 fL (ref 80.0–100.0)
Platelets: 310 10*3/uL (ref 150–400)
RBC: 4.44 MIL/uL (ref 3.87–5.11)
RDW: 14.1 % (ref 11.5–15.5)
WBC: 10 10*3/uL (ref 4.0–10.5)
nRBC: 0 % (ref 0.0–0.2)

## 2020-03-09 LAB — HEMOGLOBIN A1C
Hgb A1c MFr Bld: 8.7 % — ABNORMAL HIGH (ref 4.8–5.6)
Mean Plasma Glucose: 203 mg/dL

## 2020-03-09 MED ORDER — METOPROLOL SUCCINATE ER 200 MG PO TB24: 200.0000 mg | ORAL_TABLET | Freq: Every day | ORAL | 1 refills | Status: DC

## 2020-03-09 MED ORDER — METOPROLOL SUCCINATE ER 100 MG PO TB24: 200.0000 mg | ORAL_TABLET | Freq: Every day | ORAL | Status: DC

## 2020-03-09 MED ORDER — ESCITALOPRAM OXALATE 20 MG PO TABS: 20.0000 mg | ORAL_TABLET | Freq: Every day | ORAL | 11 refills | Status: AC

## 2020-03-09 NOTE — Progress Notes (Signed)
Progress Note  Patient Name: Sharon Munoz Date of Encounter: 03/09/2020  Primary Cardiologist: Nanetta Batty, MD   Subjective   Feeling well.  Notes that she is under a lot of stress at home.  Currently denies palpitations or chest pain.  Inpatient Medications    Scheduled Meds: . enoxaparin (LOVENOX) injection  40 mg Subcutaneous Q24H  . gabapentin  100 mg Oral Daily  . insulin aspart  0-15 Units Subcutaneous TID WC  . insulin aspart  0-5 Units Subcutaneous QHS  . loratadine  10 mg Oral Daily  . metoprolol tartrate  100 mg Oral BID  . pravastatin  20 mg Oral q1800  . sodium chloride flush  3 mL Intravenous Q12H  . sodium chloride flush  3 mL Intravenous Q12H   Continuous Infusions: . sodium chloride    . sodium chloride    . methocarbamol (ROBAXIN) IV     PRN Meds: sodium chloride, sodium chloride, acetaminophen, methocarbamol (ROBAXIN) IV, morphine injection, ondansetron (ZOFRAN) IV, sodium chloride flush, sodium chloride flush   Vital Signs    Vitals:   03/09/20 0500 03/09/20 0606 03/09/20 0753 03/09/20 0930  BP:  113/77  120/64  Pulse:  77  89  Resp: 17 19 18    Temp:  98.1 F (36.7 C)    TempSrc:  Oral    SpO2:  98%    Weight: 58 kg     Height:        Intake/Output Summary (Last 24 hours) at 03/09/2020 1011 Last data filed at 03/09/2020 0936 Gross per 24 hour  Intake 1474.08 ml  Output 300 ml  Net 1174.08 ml   Last 3 Weights 03/09/2020 03/07/2020 04/03/2019  Weight (lbs) 127 lb 12.8 oz 140 lb 142 lb  Weight (kg) 57.97 kg 63.504 kg 64.411 kg      Telemetry    Sinus rhythm.  Occasional PVCs- Personally Reviewed  ECG    Sinus rhythm.  Rate 74 bpm.  Inferolateral T wave inversions.  LAFB. - Personally Reviewed  Physical Exam   VS:  BP 120/64   Pulse 89   Temp 98.1 F (36.7 C) (Oral)   Resp 18   Ht 5\' 3"  (1.6 m)   Wt 58 kg   SpO2 98%   BMI 22.64 kg/m  , BMI Body mass index is 22.64 kg/m. GENERAL:  Well appearing HEENT: Pupils equal  round and reactive, fundi not visualized, oral mucosa unremarkable NECK:  No jugular venous distention, waveform within normal limits, carotid upstroke brisk and symmetric, no bruits, no thyromegaly LYMPHATICS:  No cervical adenopathy LUNGS:  Clear to auscultation bilaterally HEART:  RRR.  PMI not displaced or sustained,S1 and S2 within normal limits, no S3, no S4, no clicks, no rubs, III/VI systolic murmur at the RUSB ABD:  Flat, positive bowel sounds normal in frequency in pitch, no bruits, no rebound, no guarding, no midline pulsatile mass, no hepatomegaly, no splenomegaly EXT:  2 plus pulses throughout, no edema, no cyanosis no clubbing SKIN:  No rashes no nodules NEURO:  Cranial nerves II through XII grossly intact, motor grossly intact throughout PSYCH:  Cognitively intact, oriented to person place and time   Labs    High Sensitivity Troponin:   Recent Labs  Lab 03/07/20 2118 03/07/20 2345 03/08/20 0815 03/08/20 1016  TROPONINIHS 5 19* 174* 284*      Chemistry Recent Labs  Lab 03/07/20 2118 03/07/20 2118 03/08/20 1643 03/08/20 1644 03/09/20 0340  NA 141   < > 144  145 140  K 3.3*   < > 3.3* 3.2* 3.6  CL 101  --   --   --  109  CO2 21*  --   --   --  20*  GLUCOSE 218*  --   --   --  338*  BUN 11  --   --   --  13  CREATININE 0.74  --   --   --  0.61  CALCIUM 10.2  --   --   --  9.2  GFRNONAA >60  --   --   --  >60  GFRAA >60  --   --   --  >60  ANIONGAP 19*  --   --   --  11   < > = values in this interval not displayed.     Hematology Recent Labs  Lab 03/07/20 2118 03/07/20 2118 03/08/20 1643 03/08/20 1644 03/09/20 0340  WBC 10.0  --   --   --  10.0  RBC 4.87  --   --   --  4.44  HGB 14.3   < > 13.3 12.6 13.2  HCT 44.6   < > 39.0 37.0 40.5  MCV 91.6  --   --   --  91.2  MCH 29.4  --   --   --  29.7  MCHC 32.1  --   --   --  32.6  RDW 13.8  --   --   --  14.1  PLT 348  --   --   --  310   < > = values in this interval not displayed.    BNPNo  results for input(s): BNP, PROBNP in the last 168 hours.   DDimer No results for input(s): DDIMER in the last 168 hours.   Radiology    DG Chest 2 View  Result Date: 03/07/2020 CLINICAL DATA:  64 year old female with shortness of breath and tachycardia. EXAM: CHEST - 2 VIEW COMPARISON:  Chest radiographs 04/02/2008 and earlier. FINDINGS: Stable mild tortuosity of the thoracic aorta. Other mediastinal contours are within normal limits. Visualized tracheal air column is within normal limits. Lung volumes are stable and within normal limits. Both lungs appear clear. Negative visible bowel gas pattern and osseous structures. IMPRESSION: Negative.  No acute cardiopulmonary abnormality. Electronically Signed   By: Odessa FlemingH  Hall M.D.   On: 03/07/2020 21:30   CT ANGIO CHEST PE W OR WO CONTRAST  Result Date: 03/08/2020 CLINICAL DATA:  Shortness of breath. EXAM: CT ANGIOGRAPHY CHEST WITH CONTRAST TECHNIQUE: Multidetector CT imaging of the chest was performed using the standard protocol during bolus administration of intravenous contrast. Multiplanar CT image reconstructions and MIPs were obtained to evaluate the vascular anatomy. CONTRAST:  100mL OMNIPAQUE IOHEXOL 350 MG/ML SOLN COMPARISON:  None. FINDINGS: Cardiovascular: Satisfactory opacification of the pulmonary arteries to the segmental level. No evidence of pulmonary embolism. A mild amount of artifact is suspected along the a posteromedial lower branch of the left pulmonary artery. Normal heart size. No pericardial effusion. Mediastinum/Nodes: No enlarged mediastinal, hilar, or axillary lymph nodes. Thyroid gland, trachea, and esophagus demonstrate no significant findings. Lungs/Pleura: Very mild posterior bilateral lower lobe atelectasis seen. There is no evidence of acute infiltrate, pleural effusion or pneumothorax. Upper Abdomen: There is a small hiatal hernia. Musculoskeletal: No chest wall abnormality. No acute or significant osseous findings. Review of the  MIP images confirms the above findings. IMPRESSION: 1. No evidence of pulmonary embolus. 2. Very mild posterior bilateral lower  lobe atelectasis. 3. Small hiatal hernia. Electronically Signed   By: Aram Candela M.D.   On: 03/08/2020 03:19   CARDIAC CATHETERIZATION  Result Date: 03/08/2020 Conclusions: 1. Tortuous coronary arteries without angiographically significant stenosis. 2. Upper normal to mildly elevated left heart and pulmonary artery pressures. 3. Normal right heart filling pressures. 4. Normal cardiac output/index. 5. Dynamic LVOT gradient of 25 mmHg at rest and 65-70 mmHg post-PVC.  Recommendations: 1. Primary prevention of coronary artery disease. 2. Hold amlodipine and increase metoprolol tartrate to 100 mg twice daily to improve rate control. Yvonne Kendall, MD [facility]   ECHOCARDIOGRAM COMPLETE  Result Date: 03/08/2020    ECHOCARDIOGRAM REPORT   Patient Name:   Sharon Munoz Date of Exam: 03/08/2020 Medical Rec #:  [id number]        Height:       63.0 in Accession #:    [id number]       Weight:       140.0 lb Date of Birth:  09-Feb-1956         BSA:          1.662 m Patient Age:    63 years         BP:           148/82 mmHg Patient Gender: F                HR:           120 bpm. Exam Location:  Inpatient Procedure: 2D Echo, Color Doppler and Cardiac Doppler STAT ECHO Indications:    R07.9* Chest pain, unspecified  History:        Patient has prior history of Echocardiogram examinations, most                 recent 08/11/2018. Signs/Symptoms:Murmur; Risk                 Factors:Hypertension, Diabetes and Dyslipidemia.  Sonographer:    Irving Burton Senior RDCS Referring Phys: 40 RHONDA G BARRETT  Sonographer Comments: IMPRESSIONS  1. Left ventricular ejection fraction, by estimation, is 45 to 50%. The left ventricle has mildly decreased function. There is apical dyskinesis/ballooning with hyperdynamic contraction of the basal segments. There is mild asymmetric left ventricular hypertrophy of  the basal-septal segment. Indeterminate diastolic filling due to E-A fusion.  2. There is severe systolic anterior motion of the mitral valve with severe left ventricular outflow obstruction and a dynamic gradient up to 80 mm Hg.  3. Right ventricular systolic function is hyperdynamic. The right ventricular size is normal. There is normal pulmonary artery systolic pressure.  4. Left atrial size was mildly dilated.  5. The mitral valve is normal in structure. Moderate to severe mitral valve regurgitation. No evidence of mitral stenosis.  6. The aortic valve is tricuspid. Aortic valve regurgitation is not visualized. Mild aortic valve sclerosis is present, with no evidence of aortic valve stenosis. Comparison(s): Prior images reviewed side by side. Changes from prior study are noted. Compared to the previous study, there is a new apical wall motion abnormality with compensatory hyperdynamic contractility of the basal segments. There is secondary severe dynamic LV outflow tract obstruction and moderate to severe mitral insufficiency due to systolic anterior motion. The overall appearance is suggestive of stress cardiomyopathy (takotsubo syndrome), although severe LAD territory ischemia may produce a similar pattern. While there may be a component of preexisting hypertrophic obstructive cardiomyopathy based on the 2019 echo, the majority of the pathophysiological changes are acute (  note mild LVH, absence of major left atrial dilation and remarkably high mitral annulus diastolic velocities). Serial studies, cardiac MRI and correlation with ECG and cardiac enzymes will help clarify the diagnosis. FINDINGS  Left Ventricle: Left ventricular ejection fraction, by estimation, is 45 to 50%. The left ventricle has mildly decreased function. The left ventricle demonstrates regional wall motion abnormalities. The left ventricular internal cavity size was normal in size. There is mild asymmetric left ventricular hypertrophy of the  basal-septal segment. Indeterminate diastolic filling due to E-A fusion. Right Ventricle: The right ventricular size is normal. No increase in right ventricular wall thickness. Right ventricular systolic function is hyperdynamic. There is normal pulmonary artery systolic pressure. The tricuspid regurgitant velocity is 2.22 m/s, and with an assumed right atrial pressure of 3 mmHg, the estimated right ventricular systolic pressure is 22.7 mmHg. Left Atrium: Left atrial size was mildly dilated. Right Atrium: Right atrial size was normal in size. Pericardium: There is no evidence of pericardial effusion. Mitral Valve: The mitral valve is normal in structure. Mild mitral annular calcification. Moderate to severe mitral valve regurgitation, with centrally-directed jet. No evidence of mitral valve stenosis. Tricuspid Valve: The tricuspid valve is normal in structure. Tricuspid valve regurgitation is not demonstrated. Aortic Valve: The aortic valve is tricuspid. Aortic valve regurgitation is not visualized. Mild aortic valve sclerosis is present, with no evidence of aortic valve stenosis. Pulmonic Valve: The pulmonic valve was grossly normal. Pulmonic valve regurgitation is not visualized. Aorta: The aortic root and ascending aorta are structurally normal, with no evidence of dilatation. IAS/Shunts: No atrial level shunt detected by color flow Doppler.  LEFT VENTRICLE PLAX 2D LVIDd:         3.50 cm LVIDs:         2.80 cm LV PW:         0.70 cm LV IVS:        1.40 cm LVOT diam:     1.80 cm LVOT Area:     2.54 cm  RIGHT VENTRICLE RV S prime:     21.10 cm/s TAPSE (M-mode): 2.0 cm LEFT ATRIUM             Index       RIGHT ATRIUM          Index LA diam:        3.70 cm 2.23 cm/m  RA Area:     9.87 cm LA Vol (A2C):   52.0 ml 31.29 ml/m RA Volume:   19.30 ml 11.61 ml/m LA Vol (A4C):   39.9 ml 24.01 ml/m LA Biplane Vol: 47.1 ml 28.35 ml/m   AORTA Ao Root diam: 2.90 cm Ao Asc diam:  3.40 cm TRICUSPID VALVE TR Peak grad:   19.7  mmHg TR Vmax:        222.00 cm/s  SHUNTS Systemic Diam: 1.80 cm Thurmon Fair MD Electronically signed by Thurmon Fair MD Signature Date/Time: 03/08/2020/11:03:17 AM    Final     Cardiac Studies   Echo 03/08/20: IMPRESSIONS   1. Left ventricular ejection fraction, by estimation, is 45 to 50%. The  left ventricle has mildly decreased function. There is apical  dyskinesis/ballooning with hyperdynamic contraction of the basal segments.  There is mild asymmetric left ventricular  hypertrophy of the basal-septal segment. Indeterminate diastolic filling  due to E-A fusion.  2. There is severe systolic anterior motion of the mitral valve with  severe left ventricular outflow obstruction and a dynamic gradient up to  80 mm Hg.  3. Right ventricular systolic function is hyperdynamic. The right  ventricular size is normal. There is normal pulmonary artery systolic  pressure.  4. Left atrial size was mildly dilated.  5. The mitral valve is normal in structure. Moderate to severe mitral  valve regurgitation. No evidence of mitral stenosis.  6. The aortic valve is tricuspid. Aortic valve regurgitation is not  visualized. Mild aortic valve sclerosis is present, with no evidence of  aortic valve stenosis.   LHC 03/08/20:  Conclusions: 1. Tortuous coronary arteries without angiographically significant stenosis. 2. Upper normal to mildly elevated left heart and pulmonary artery pressures. 3. Normal right heart filling pressures. 4. Normal cardiac output/index. 5. Dynamic LVOT gradient of 25 mmHg at rest and 65-70 mmHg post-PVC.  Recommendations: 1. Primary prevention of coronary artery disease. 2. Hold amlodipine and increase metoprolol tartrate to 100 mg twice daily to improve rate control.  ECHO: 08/11/2018 - Left ventricle: The cavity size was normal. Systolic function was  normal. The estimated ejection fraction was in the range of 60%  to 65%. Wall motion was normal; there were  no regional wall  motion abnormalities. There is accelerated flow through left  ventricular outflow tract. Likely cause of murmur. Doppler  parameters are consistent with abnormal left ventricular  relaxation (grade 1 diastolic dysfunction).  - Aortic valve: Trileaflet; mildly thickened, mildly calcified  leaflets. There was trivial regurgitation.  - Pulmonic valve: There was mild regurgitation.   CATH: Apr 03, 2008 EF was 60%. She had normal coronary arteries. It showed normal renal arteries also.   Patient Profile     64 y.o. female with diabetes, hypertension, palpitations, and murmur admitted with palpitations and muscle spasms.  Assessment & Plan    # Dynamic outflow track obstruction:  Echo revealed LVEF 45 to 50% with apical dyskinesis last ballooning and hyperdynamic contraction of the basal septum.  There was moderate septal hypertrophy and severe systolic anterior motion of the mitral valve.  At cath, LVOT gradient 25 mmHg at rest and increased to 65-70 mHg post PVC, consistent with HCM.  However, findings seem to be most consistent with Takotsubo cardiomyopathy.  On talking with the patient she has very significant home stress.  She is raising her 20 and 96 year old grandchildren.  Her daughter is irresponsible and unable to care for them.  She also works a Education officer, community.  The 64 year old is very active and she has a lots of driving to keep him in his activities.  The 44-year-old does not sleep well and is not potty trained.  She does not get any sleep overnight.  She only rest for 6 hours.  Her water heater went out and she is struggling financially.  Additionally her father died recently and her mother had Covid.  Since her mother has returned home she is unable to stay alone at night.  Ms. Wilensky brings the children to stay with her mother on the weekends.  She is a single parent and does not have very much support.  We discussed the importance of seeking help and she  will talk with her primary care doctor about getting some therapy.  She will also work on trying to find 30 minutes for herself each day.  Her PVCs and tachycardia seem to have improved with metoprolol.  Her home atenolol has been discontinued.  We will switch metoprolol to succinate given her systolic dysfunction.  She may want to take this at night to see if it helps with her sleep.  She  will need a repeat echo in a few months as an outpatient.  Consider outpatient cardiac MRI.  Will reach out to our social work team to see if there is any way we can help.  # Tachycardia: # Palpitations: Improved with metoprolol as above.  # Elevated troponin:  Hs troponin elevated to 284.  She had no coronary disease at cath.  # Hyperlipidemia:  Continue pravastatin. LDL goal <70 given DM.  # Essential hypertension: Home atenolol and amlodipine stopped.  She had hives and swelling with ACE inhibitor.  Therefore she is not on an ARB.  Blood pressure is currently well-controlled.  If she needs additional medications consider hydralazine and nitrates or spironolactone as an outpatient.  [facility] will sign off.   Medication Recommendations:  Metoprolol succinate Other recommendations (labs, testing, etc):  Echo 3 months Follow up as an outpatient:  We will arrange  For questions or updates, please contact [place] Please consult [email or web address] for contact info under        Signed, Skeet Latch, MD  03/09/2020, 10:11 AM

## 2020-03-09 NOTE — Progress Notes (Signed)
CSW met with patient to address consult for substance use. Patient denies substance use and wanted mental health and grief counseling resources. CSW spoke with patient and she stated that she would follow up with her PCP for mental health counseling. CSW informed patient that [facility] contact information in the discharge summary.

## 2020-03-09 NOTE — Progress Notes (Signed)
Patient ambulated about 30 feet in the hall. Patient denied feeling any weakness or dizziness. She stated she felt a sharp pain in her left hip when walking, but it stopped when she stopped walking. She also stated mild chest pain when inhaling. After walking she sat in a chair and said all pain was relieved, and she did not need anything for it. Primary RN notified.

## 2020-03-09 NOTE — Discharge Summary (Signed)
Physician Discharge Summary  Sharon Munoz KDT:[id number] DOB: 1956-11-05 DOA: 03/07/2020  PCP: Haywood Pao, MD  Admit date: 03/07/2020 Discharge date: 03/09/2020  Time spent: 40 minutes  Recommendations for Outpatient Follow-up:  1. Started on metoprolol 200 XL this admission discontinued amlodipine, also started on Lexapro 20 need to titrate to goal in about 2 to 4 weeks and ensure that helping with symptoms 2. Needs CBC Chem-12 1 week 2 weeks 3. Outpatient cardiology follow-up will be scheduled according to cardiology 4. Resources were given on discharge for social support/counseling  Discharge Diagnoses:  Principal Problem:   Elevated troponin Active Problems:   Diabetes mellitus (HCC)   Dyslipidemia   Essential hypertension   Chest pain   Back muscle spasm   Discharge Condition: Improved  Diet recommendation: Heart healthy  Filed Weights   03/07/20 2106 03/09/20 0500  Weight: 63.5 kg 58 kg    History of present illness:  64 year old black female known HTN DM TY 2 presented to urgent care 4/9 with spasm muscle spasm in the back and urticaria also was found to be tachycardic and had an elevated troponin Patient has been under immense stress secondary to taking care of 3 grandchildren, elderly mother, loss of father January of this year and resultant depression, multiple other psychosocial stressors-previously used to use Lexapro but was taking it only intermittently  -cardiology consulted and echo showed EF 45 to 50% wall motion abnormality and cardiac cath was performed Cardiac cath FINDINGS: 1. Tortuous coronary arteries without angiographically significant stenosis. 2. Normal left and right heart filling pressures. 3. Normal cardiac output/index. 4. Dynamic LVOT gradient of 25 mmHg at rest and 65-70 mmHg post-PVC.   RECOMMENDATIONS: 1. Primary prevention of coronary artery disease. 2. Hold amlodipine and increase metoprolol tartrate to 100 mg BID to improve  rate control.   Echo EF 45-50%  BUN/creatinine 13/0.6 CO2 20 WBC 10 hemoglobin 13 platelet 310  Cardiology saw the patient in consult and felt she was stabilized for discharge and continued her metoprolol because of PVCs and probable Takotsubo-she is stabilized for discharge 5 placed her back on Lexapro which she was not taking regularly she will get a Education officer, museum to see her and she will have outpatient cardiology follow-up-she is also aware that she needs follow-up with primary care physician   Discharge Exam: Vitals:   03/09/20 0753 03/09/20 0930  BP:  120/64  Pulse:  89  Resp: 18   Temp:    SpO2:      General: Awake pleasant oriented pressured speech no new other issues she states that she feels somewhat better and is appreciative of cardiology input into her care  Cardiovascular: S1-S2 PVCs slight sinus tach Respiratory: Clinically clear no added sound no rales no rhonchi Abdomen soft nontender no rebound no guarding Neurologically intact no focal deficit  Discharge Instructions   Discharge Instructions    Diet - low sodium heart healthy   Complete by: As directed    Discharge instructions   Complete by: As directed    Make sure that you follow-up with your primary physician-main changes to medications are replacement of some your blood pressure meds that are important for you to take because of your heart rate and the changes on your ultrasound and on the heart test that they did on you We have renewed your prescription of Lexapro which he used in the past but for to work you need to take it daily and the social worker should be by to  help you with resources to get some counseling on discharge Please follow-up and get lab work at your primary physician's office in about 1 to 2 weeks and also to ensure that you are stable   Increase activity slowly   Complete by: As directed      Allergies as of 03/09/2020      Reactions   Ace Inhibitors Itching, Swelling   Citrus  Hives, Swelling, Other (See Comments)   Pt says she gets knots.    Codeine Nausea And Vomiting   Excessive vomiting   Lisinopril Hives   Other Itching, Swelling, Other (See Comments)   Any type of nut Pt will get mouth blisters   Percocet [oxycodone-acetaminophen]    Disoriented, difficulty waking up and hard to focus, vomiting   Propoxyphene Other (See Comments)   Darvocet (Propoxyphene-Acetaminophen).  Causes Disorientation, difficulty waking up, difficulty focusing, and vomiting.   Januvia [sitagliptin] Itching, Palpitations      Medication List    STOP taking these medications   amLODipine 10 MG tablet Commonly known as: NORVASC   cetirizine 10 MG tablet Commonly known as: ZYRTEC   diphenhydrAMINE 25 MG tablet Commonly known as: BENADRYL   fluticasone 50 MCG/ACT nasal spray Commonly known as: FLONASE   metFORMIN 750 MG 24 hr tablet Commonly known as: GLUCOPHAGE-XR   methocarbamol 500 MG tablet Commonly known as: ROBAXIN   omeprazole 20 MG capsule Commonly known as: PRILOSEC   predniSONE 20 MG tablet Commonly known as: DELTASONE   traMADol 50 MG tablet Commonly known as: ULTRAM     TAKE these medications   aspirin EC 81 MG tablet Take 81 mg by mouth daily.   clindamycin 2 % vaginal cream Commonly known as: CLEOCIN Place 1 Applicatorful vaginally at bedtime. For 6 days   escitalopram 20 MG tablet Commonly known as: Lexapro Take 1 tablet (20 mg total) by mouth daily.   gabapentin 100 MG capsule Commonly known as: NEURONTIN Take 3 capsules (300 mg total) by mouth 3 (three) times daily. What changed:   how much to take  when to take this   Janumet XR 50-1000 MG Tb24 Generic drug: SitaGLIPtin-MetFORMIN HCl Take 1 tablet by mouth in the morning and at bedtime.   Jardiance 25 MG Tabs tablet Generic drug: empagliflozin Take 25 mg by mouth daily.   MAGNESIUM PO Take 1 tablet by mouth daily at 12 noon.   metoprolol 200 MG 24 hr tablet Commonly  known as: TOPROL-XL Take 1 tablet (200 mg total) by mouth daily. Take with or immediately following a meal.   pravastatin 20 MG tablet Commonly known as: PRAVACHOL Take 20 mg by mouth daily.   VITAMIN D3 PO Take 1 capsule by mouth daily. [facility] Brand      Allergies  Allergen Reactions  . Ace Inhibitors Itching and Swelling  . Citrus Hives, Swelling and Other (See Comments)    Pt says she gets knots.   . Codeine Nausea And Vomiting    Excessive vomiting  . Lisinopril Hives  . Other Itching, Swelling and Other (See Comments)    Any type of nut Pt will get mouth blisters  . Percocet [Oxycodone-Acetaminophen]     Disoriented, difficulty waking up and hard to focus, vomiting  . Propoxyphene Other (See Comments)    Darvocet (Propoxyphene-Acetaminophen).  Causes Disorientation, difficulty waking up, difficulty focusing, and vomiting.  Alma Friendly [Sitagliptin] Itching and Palpitations      The results of significant diagnostics from this hospitalization (including imaging, microbiology,  ancillary and laboratory) are listed below for reference.    Significant Diagnostic Studies: DG Chest 2 View  Result Date: 03/07/2020 CLINICAL DATA:  65 year old female with shortness of breath and tachycardia. EXAM: CHEST - 2 VIEW COMPARISON:  Chest radiographs 04/02/2008 and earlier. FINDINGS: Stable mild tortuosity of the thoracic aorta. Other mediastinal contours are within normal limits. Visualized tracheal air column is within normal limits. Lung volumes are stable and within normal limits. Both lungs appear clear. Negative visible bowel gas pattern and osseous structures. IMPRESSION: Negative.  No acute cardiopulmonary abnormality. Electronically Signed   By: Odessa Fleming M.D.   On: 03/07/2020 21:30   CT ANGIO CHEST PE W OR WO CONTRAST  Result Date: 03/08/2020 CLINICAL DATA:  Shortness of breath. EXAM: CT ANGIOGRAPHY CHEST WITH CONTRAST TECHNIQUE: Multidetector CT imaging of the chest was performed  using the standard protocol during bolus administration of intravenous contrast. Multiplanar CT image reconstructions and MIPs were obtained to evaluate the vascular anatomy. CONTRAST:  OMNIPAQUE IOHEXOL 350 MG/ML SOLN COMPARISON:  None. FINDINGS: Cardiovascular: Satisfactory opacification of the pulmonary arteries to the segmental level. No evidence of pulmonary embolism. A mild amount of artifact is suspected along the a posteromedial lower branch of the left pulmonary artery. Normal heart size. No pericardial effusion. Mediastinum/Nodes: No enlarged mediastinal, hilar, or axillary lymph nodes. Thyroid gland, trachea, and esophagus demonstrate no significant findings. Lungs/Pleura: Very mild posterior bilateral lower lobe atelectasis seen. There is no evidence of acute infiltrate, pleural effusion or pneumothorax. Upper Abdomen: There is a small hiatal hernia. Musculoskeletal: No chest wall abnormality. No acute or significant osseous findings. Review of the MIP images confirms the above findings. IMPRESSION: 1. No evidence of pulmonary embolus. 2. Very mild posterior bilateral lower lobe atelectasis. 3. Small hiatal hernia. Electronically Signed   By: Aram Candela M.D.   On: 03/08/2020 03:19   CARDIAC CATHETERIZATION  Result Date: 03/08/2020 Conclusions: 1. Tortuous coronary arteries without angiographically significant stenosis. 2. Upper normal to mildly elevated left heart and pulmonary artery pressures. 3. Normal right heart filling pressures. 4. Normal cardiac output/index. 5. Dynamic LVOT gradient of 25 mmHg at rest and 65-70 mmHg post-PVC.  Recommendations: 1. Primary prevention of coronary artery disease. 2. Hold amlodipine and increase metoprolol tartrate to 100 mg twice daily to improve rate control. Yvonne Kendall, MD [facility]   ECHOCARDIOGRAM COMPLETE  Result Date: 03/08/2020    ECHOCARDIOGRAM REPORT   Patient Name:   Sharon Munoz Date of Exam: 03/08/2020 Medical Rec #:   [id number]        Height:       63.0 in Accession #:    [id number]       Weight:       140.0 lb Date of Birth:  09/21/1956         BSA:          1.662 m Patient Age:    63 years         BP:           148/82 mmHg Patient Gender: F                HR:           120 bpm. Exam Location:  Inpatient Procedure: 2D Echo, Color Doppler and Cardiac Doppler STAT ECHO Indications:    R07.9* Chest pain, unspecified  History:        Patient has prior history of Echocardiogram examinations, most  recent 08/11/2018. Signs/Symptoms:Murmur; Risk                 Factors:Hypertension, Diabetes and Dyslipidemia.  Sonographer:    Irving Burton Senior RDCS Referring Phys: 16 RHONDA G BARRETT  Sonographer Comments: IMPRESSIONS  1. Left ventricular ejection fraction, by estimation, is 45 to 50%. The left ventricle has mildly decreased function. There is apical dyskinesis/ballooning with hyperdynamic contraction of the basal segments. There is mild asymmetric left ventricular hypertrophy of the basal-septal segment. Indeterminate diastolic filling due to E-A fusion.  2. There is severe systolic anterior motion of the mitral valve with severe left ventricular outflow obstruction and a dynamic gradient up to 80 mm Hg.  3. Right ventricular systolic function is hyperdynamic. The right ventricular size is normal. There is normal pulmonary artery systolic pressure.  4. Left atrial size was mildly dilated.  5. The mitral valve is normal in structure. Moderate to severe mitral valve regurgitation. No evidence of mitral stenosis.  6. The aortic valve is tricuspid. Aortic valve regurgitation is not visualized. Mild aortic valve sclerosis is present, with no evidence of aortic valve stenosis. Comparison(s): Prior images reviewed side by side. Changes from prior study are noted. Compared to the previous study, there is a new apical wall motion abnormality with compensatory hyperdynamic contractility of the basal segments. There is secondary severe  dynamic LV outflow tract obstruction and moderate to severe mitral insufficiency due to systolic anterior motion. The overall appearance is suggestive of stress cardiomyopathy (takotsubo syndrome), although severe LAD territory ischemia may produce a similar pattern. While there may be a component of preexisting hypertrophic obstructive cardiomyopathy based on the 2019 echo, the majority of the pathophysiological changes are acute (note mild LVH, absence of major left atrial dilation and remarkably high mitral annulus diastolic velocities). Serial studies, cardiac MRI and correlation with ECG and cardiac enzymes will help clarify the diagnosis. FINDINGS  Left Ventricle: Left ventricular ejection fraction, by estimation, is 45 to 50%. The left ventricle has mildly decreased function. The left ventricle demonstrates regional wall motion abnormalities. The left ventricular internal cavity size was normal in size. There is mild asymmetric left ventricular hypertrophy of the basal-septal segment. Indeterminate diastolic filling due to E-A fusion. Right Ventricle: The right ventricular size is normal. No increase in right ventricular wall thickness. Right ventricular systolic function is hyperdynamic. There is normal pulmonary artery systolic pressure. The tricuspid regurgitant velocity is 2.22 m/s, and with an assumed right atrial pressure of 3 mmHg, the estimated right ventricular systolic pressure is 22.7 mmHg. Left Atrium: Left atrial size was mildly dilated. Right Atrium: Right atrial size was normal in size. Pericardium: There is no evidence of pericardial effusion. Mitral Valve: The mitral valve is normal in structure. Mild mitral annular calcification. Moderate to severe mitral valve regurgitation, with centrally-directed jet. No evidence of mitral valve stenosis. Tricuspid Valve: The tricuspid valve is normal in structure. Tricuspid valve regurgitation is not demonstrated. Aortic Valve: The aortic valve is  tricuspid. Aortic valve regurgitation is not visualized. Mild aortic valve sclerosis is present, with no evidence of aortic valve stenosis. Pulmonic Valve: The pulmonic valve was grossly normal. Pulmonic valve regurgitation is not visualized. Aorta: The aortic root and ascending aorta are structurally normal, with no evidence of dilatation. IAS/Shunts: No atrial level shunt detected by color flow Doppler.  LEFT VENTRICLE PLAX 2D LVIDd:         3.50 cm LVIDs:         2.80 cm LV PW:  0.70 cm LV IVS:        1.40 cm LVOT diam:     1.80 cm LVOT Area:     2.54 cm  RIGHT VENTRICLE RV S prime:     21.10 cm/s TAPSE (M-mode): 2.0 cm LEFT ATRIUM             Index       RIGHT ATRIUM          Index LA diam:        3.70 cm 2.23 cm/m  RA Area:     9.87 cm LA Vol (A2C):   52.0 ml 31.29 ml/m RA Volume:   19.30 ml 11.61 ml/m LA Vol (A4C):   39.9 ml 24.01 ml/m LA Biplane Vol: 47.1 ml 28.35 ml/m   AORTA Ao Root diam: 2.90 cm Ao Asc diam:  3.40 cm TRICUSPID VALVE TR Peak grad:   19.7 mmHg TR Vmax:        222.00 cm/s  SHUNTS Systemic Diam: 1.80 cm Mihai Croitoru MD Electronically signed by Thurmon Fair MD Signature Date/Time: 03/08/2020/11:03:17 AM    Final     Microbiology: Recent Results (from the past 240 hour(s))  SARS CORONAVIRUS 2 (TAT 6-24 HRS) Nasopharyngeal Nasopharyngeal Swab     Status: None   Collection Time: 03/08/20  4:44 AM   Specimen: Nasopharyngeal Swab  Result Value Ref Range Status   SARS Coronavirus 2 NEGATIVE NEGATIVE Final    Comment: (NOTE) SARS-CoV-2 target nucleic acids are NOT DETECTED. The SARS-CoV-2 RNA is generally detectable in upper and lower respiratory specimens during the acute phase of infection. Negative results do not preclude SARS-CoV-2 infection, do not rule out co-infections with other pathogens, and should not be used as the sole basis for treatment or other patient management decisions. Negative results must be combined with clinical observations, patient history,  and epidemiological information. The expected result is Negative. Fact Sheet for Patients: HairSlick.no Fact Sheet for Healthcare Providers: quierodirigir.com This test is not yet approved or cleared by the [place] FDA and  has been authorized for detection and/or diagnosis of SARS-CoV-2 by FDA under an Emergency Use Authorization (EUA). This EUA will remain  in effect (meaning this test can be used) for the duration of the COVID-19 declaration under Section 56 4(b)(1) of the Act, 21 U.S.C. section 360bbb-3(b)(1), unless the authorization is terminated or revoked sooner. Performed at [facility], [address] [place], [place] [address]      Labs: Basic Metabolic Panel: Recent Labs  Lab 03/07/20 2118 03/08/20 1643 03/08/20 1644 03/09/20 0340  NA 141 144 145 140  K 3.3* 3.3* 3.2* 3.6  CL 101  --   --  109  CO2 21*  --   --  20*  GLUCOSE 218*  --   --  338*  BUN 11  --   --  13  CREATININE 0.74  --   --  0.61  CALCIUM 10.2  --   --  9.2   Liver Function Tests: No results for input(s): AST, ALT, ALKPHOS, BILITOT, PROT, ALBUMIN in the last 168 hours. No results for input(s): LIPASE, AMYLASE in the last 168 hours. No results for input(s): AMMONIA in the last 168 hours. CBC: Recent Labs  Lab 03/07/20 2118 03/08/20 1643 03/08/20 1644 03/09/20 0340  WBC 10.0  --   --  10.0  HGB 14.3 13.3 12.6 13.2  HCT 44.6 39.0 37.0 40.5  MCV 91.6  --   --  91.2  PLT 348  --   --  310   Cardiac Enzymes: No results for input(s): CKTOTAL, CKMB, CKMBINDEX, TROPONINI in the last 168 hours. BNP: BNP (last 3 results) No results for input(s): BNP in the last 8760 hours.  ProBNP (last 3 results) No results for input(s): PROBNP in the last 8760 hours.  CBG: Recent Labs  Lab 03/07/20 2022 03/08/20 0744 03/08/20 1215 03/08/20 1823 03/09/20 0736  GLUCAP 191*  191* 151* 158* 114* 177*        Signed:  Rhetta MuraJai-Gurmukh Sumaya Riedesel MD   [facility] 03/09/2020, 11:19 AM

## 2020-03-10 LAB — HIGH SENSITIVITY CRP: CRP, High Sensitivity: 16.66 mg/L — ABNORMAL HIGH (ref 0.00–3.00)

## 2020-03-11 ENCOUNTER — Telehealth: Payer: Self-pay | Admitting: Cardiology

## 2020-03-11 NOTE — Telephone Encounter (Signed)
Patient unavailable. Asked that person answering phone have patient return call when able

## 2020-03-11 NOTE — Telephone Encounter (Signed)
-----   Message from Ellsworth Lennox, [place] sent at 03/09/2020 11:47 AM EDT ----- Regarding: [facility] Phone Call Good morning,   I scheduled this patient a hospital follow-up visit with Corine Shelter on 4/21. Can you please make a TOC call as she is being discharged today.   Thanks,  [place]

## 2020-03-12 NOTE — Telephone Encounter (Signed)
TOC attempt x 2. Left message to call back.  

## 2020-03-13 ENCOUNTER — Ambulatory Visit: Payer: BLUE CROSS/BLUE SHIELD

## 2020-03-13 NOTE — Telephone Encounter (Signed)
Called number on file- was given this number to call-  [phone] Left message advising Mrs.Capizzi to call back, left call back number.

## 2020-03-13 NOTE — Telephone Encounter (Signed)
Patient contacted regarding discharge from [facility] on 4/12.  Patient understands to follow up with provider Corine Shelter, PA on 03/20/20 at 11:15 AM at [facility] office. Patient understands discharge instructions? yes Patient understands medications and regiment? yes Patient understands to bring all medications to this visit? Yes   Patient did have questions regarding her gabapentin dose- I advised her to contact her PCP as this was a med that they could handle. The instructions were different from hospital to what they sent it. Patient will contact them.   Patient was advised to call back with pains or concerns.  Patient verbalized understanding.

## 2020-03-15 DIAGNOSIS — E230 Hypopituitarism: Secondary | ICD-10-CM | POA: Insufficient documentation

## 2020-03-15 DIAGNOSIS — E7800 Pure hypercholesterolemia, unspecified: Secondary | ICD-10-CM | POA: Insufficient documentation

## 2020-03-15 DIAGNOSIS — M545 Low back pain, unspecified: Secondary | ICD-10-CM | POA: Insufficient documentation

## 2020-03-20 ENCOUNTER — Ambulatory Visit (INDEPENDENT_AMBULATORY_CARE_PROVIDER_SITE_OTHER): Payer: BLUE CROSS/BLUE SHIELD | Admitting: Medical

## 2020-03-20 ENCOUNTER — Encounter: Payer: Self-pay | Admitting: Cardiology

## 2020-03-20 ENCOUNTER — Other Ambulatory Visit: Payer: Self-pay

## 2020-03-20 VITALS — BP 178/82 | HR 84 | Ht 63.0 in | Wt 128.6 lb

## 2020-03-20 DIAGNOSIS — E785 Hyperlipidemia, unspecified: Secondary | ICD-10-CM

## 2020-03-20 DIAGNOSIS — R002 Palpitations: Secondary | ICD-10-CM

## 2020-03-20 DIAGNOSIS — I429 Cardiomyopathy, unspecified: Secondary | ICD-10-CM

## 2020-03-20 DIAGNOSIS — I5032 Chronic diastolic (congestive) heart failure: Secondary | ICD-10-CM

## 2020-03-20 DIAGNOSIS — I10 Essential (primary) hypertension: Secondary | ICD-10-CM

## 2020-03-20 DIAGNOSIS — R011 Cardiac murmur, unspecified: Secondary | ICD-10-CM

## 2020-03-20 NOTE — Patient Instructions (Signed)
Medication Instructions:  Continue current medications  *If you need a refill on your cardiac medications before your next appointment, please call your pharmacy*   Lab Work: None Ordered   Testing/Procedures: None Ordered   Follow-Up: At [facility], you and your health needs are our priority.  As part of our continuing mission to provide you with exceptional heart care, we have created designated Provider Care Teams.  These Care Teams include your primary Cardiologist (physician) and Advanced Practice Providers (APPs -  Physician Assistants and Nurse Practitioners) who all work together to provide you with the care you need, when you need it.  We recommend signing up for the patient portal called "MyChart".  Sign up information is provided on this After Visit Summary.  MyChart is used to connect with patients for Virtual Visits (Telemedicine).  Patients are able to view lab/test results, encounter notes, upcoming appointments, etc.  Non-urgent messages can be sent to your provider as well.   To learn more about what you can do with MyChart, go to [email or web address].    Your next appointment:   4-6  week(s)  The format for your next appointment:   In Person  Provider:   You may see Nanetta Batty, MD or one of the following Advanced Practice Providers on your designated Care Team:    Corine Shelter, PA-C  [place], [place]  Edd Fabian, [place]

## 2020-03-20 NOTE — Progress Notes (Signed)
Cardiology Office Note    Date:  03/20/2020   ID:  Sharon Munoz, DOB 1956-07-29, MRN [id number]  PCP:  [name], MD  Cardiologist:  Nanetta Batty, MD  Electrophysiologist:  None   Chief Complaint: Hospital follow-up  History of Present Illness:   Sharon Munoz is a 64 y.o. female with history of DM2, HTN, HLD, muscle spasms, palpitations, chronic diastolic CHF (EF 70-26% 2019) who presents for hospital follow-up. Patient started following with Dr. Allyson Sabal in 2019, referred for palpitations thought to be due to a diabetic medication. Palpitations self resolved after medication was discontinued. Echo showed EF 60-65%, G1DD.  The patient was admitted to the hospital 4/9- 03/09/20 for palpitations/tachycardia. HS trop was elevated to 284. Stat echo showed EF 45-50% with apical dyskinesis with hyperdynamic contraction of the basal segments. The patient was therefore taken for cardiac cath which showed tortuous coronaries without significant stenosis plan to continue with medical management. Cath with LVOT gradient 25 mmHg at rest and increased to 65-70 mmHg post PVC. Findings were suggestive of HCM vs Takotsubo's. Recommended possible cardiac MRI and follow-up echo in 3 months. BB was changed from atenolol to metoprolol. NO ACE/ARB for history of hives/swelling with ACE.   Since being discharge the patient has been trying to take it easy. After being discharged the patient had lower back pain for 2 days which resolved with medication. She has been letting family members help her out with cooking, driving, and house chores. She lives with her her 75 yo child and her granddaughter. Her mother lives 3 minutes away. 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 Prior to hospitalization she was walking 2-3 miles a couple time a week which she has not started up again. She has had recent weight loss, about  14 lbs in 2 months for which PCP is working her up for this. No vomiting, nausea, or diarrhea. Patient denies sob and palpitations. She did have a twinge of chest discomfort this morning while thinking about today's appointment, felt related to anxiety. Patient takes Lexapro for anxiety. Denies lower leg edema and orthopnea. Reports diet compliance. The weekend her and her sisters are planning a weekend trip. BP elevated today, patient says she has not taken her medication today.   Past Medical History:  Diagnosis Date   Back spasm    Diabetes mellitus    Hypertension    Systolic ejection murmur 1986    Past Surgical History:  Procedure Laterality Date   CESAREAN SECTION     RIGHT/LEFT HEART CATH AND CORONARY ANGIOGRAPHY N/A 03/08/2020   Procedure: RIGHT/LEFT HEART CATH AND CORONARY ANGIOGRAPHY;  Surgeon: Yvonne Kendall, MD;  Location: [facility];  Service: Cardiovascular;  Laterality: N/A;    Current Medications: Current Meds  Medication Sig   aspirin EC 81 MG tablet Take 81 mg by mouth daily.   Cholecalciferol (VITAMIN D3 PO) Take 1 capsule by mouth daily. [employer] Brand    clindamycin (CLEOCIN) 2 % vaginal cream Place 1 Applicatorful vaginally at bedtime. For 6 days   empagliflozin (JARDIANCE) 25 MG TABS tablet Take 25 mg by mouth daily.   escitalopram (LEXAPRO) 20 MG tablet Take 1 tablet (20 mg total) by mouth daily.   gabapentin (NEURONTIN) 100 MG capsule Take 3 capsules (300 mg total) by mouth 3 (three) times daily. (Patient taking differently: Take 100 mg by mouth daily. )  MAGNESIUM PO Take 1 tablet by mouth daily at 12 noon.   metoprolol succinate (TOPROL-XL) 200 MG 24 hr tablet Take 1 tablet (200 mg total) by mouth daily. Take with or immediately following a meal.   pravastatin (PRAVACHOL) 20 MG tablet Take 20 mg by mouth daily.   SitaGLIPtin-MetFORMIN HCl (JANUMET XR) 50-1000 MG TB24 Take 1 tablet by mouth in the morning and at bedtime.      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  Tobacco Use   Smoking status: Never Smoker   Smokeless tobacco: Never Used  Substance and Sexual Activity   Alcohol use: No    Alcohol/week: 0.0 standard drinks   Drug use: Never   Sexual activity: Not on file  Other Topics Concern   Not on file  Social History Narrative   Not on file   Social Determinants of Health   Financial Resource Strain:    Difficulty of Paying Living Expenses:   Food Insecurity:    Worried About Programme researcher, broadcasting/film/video in the Last Year:    Barista in the Last Year:   Transportation Needs:    Freight forwarder (Medical):    Lack of Transportation (Non-Medical):   Physical Activity:    Days of Exercise per Week:    Minutes of Exercise per Session:   Stress:    Feeling of Stress :   Social Connections:    Frequency of Communication with Friends and Family:    Frequency of Social Gatherings with Friends and Family:    Attends Religious Services:    Active Member of Clubs or Organizations:    Attends Engineer, structural:    Marital Status:      Family History:  The patient's family history includes Asthma in her brother and father; CVA (age of onset: 75) in her father; Colon cancer (age of onset: 12) in her brother; Hypertension in her father and mother; Kidney cancer in her father. There is no history of Eczema, Immunodeficiency, or Urticaria.  ROS:   Please see the history of present illness. Otherwise, review of systems is positive for  All other systems  are reviewed and otherwise negative.    EKGs/Labs/Other Studies Reviewed:    Studies reviewed are outlined and summarized above. Reports included below if pertinent.  Cardiac Cath 03/08/20 Conclusions: 1. Tortuous coronary arteries without angiographically significant stenosis. 2. Upper normal to mildly elevated left heart and pulmonary artery pressures. 3. Normal right heart filling pressures. 4. Normal cardiac output/index. 5. Dynamic LVOT gradient of 25 mmHg at rest and 65-70 mmHg post-PVC.  Recommendations: 1. Primary prevention of coronary artery disease. 2. Hold amlodipine and increase metoprolol tartrate to 100 mg twice daily to improve rate control.  Yvonne Kendall, MD [facility]  Echo 03/08/20 Sonographer Comments:  IMPRESSIONS    1. Left ventricular ejection fraction, by estimation, is 45 to 50%. The  left ventricle has mildly decreased function. There is apical  dyskinesis/ballooning with hyperdynamic contraction of the basal segments.  There is mild asymmetric left ventricular  hypertrophy of the basal-septal segment.  Indeterminate diastolic filling  due to E-A fusion.  2. There is severe systolic anterior motion of the mitral valve with  severe left ventricular outflow obstruction and a dynamic gradient up to  80 mm Hg.  3. Right ventricular systolic function is hyperdynamic. The right  ventricular size is normal. There is normal pulmonary artery systolic  pressure.  4. Left atrial size was mildly dilated.  5. The mitral valve is normal in structure. Moderate to severe mitral  valve regurgitation. No evidence of mitral stenosis.  6. The aortic valve is tricuspid. Aortic valve regurgitation is not  visualized. Mild aortic valve sclerosis is present, with no evidence of  aortic valve stenosis.   Comparison(s): Prior images reviewed side by side. Changes from prior  study are noted. Compared to the previous study, there is a new apical  wall motion  abnormality with compensatory hyperdynamic contractility of  the basal segments. There is secondary  severe dynamic LV outflow tract obstruction and moderate to severe mitral  insufficiency due to systolic anterior motion. The overall appearance is  suggestive of stress cardiomyopathy (takotsubo syndrome), although severe  LAD territory ischemia may  produce a similar pattern. While there may be a component of preexisting  hypertrophic obstructive cardiomyopathy based on the 2019 echo, the  majority of the pathophysiological changes are acute (note mild LVH,  absence of major left atrial dilation and  remarkably high mitral annulus diastolic velocities). Serial studies,  cardiac MRI and correlation with ECG and cardiac enzymes will help clarify  the diagnosis.       EKG:  EKG is not ordered today  Recent Labs: 03/08/2020: TSH 0.339 03/09/2020: BUN 13; Creatinine, Ser 0.61; Hemoglobin 13.2; Platelets 310; Potassium 3.6; Sodium 140  Recent Lipid Panel    Component Value Date/Time   CHOL 143 03/08/2020 0815   TRIG 41 03/08/2020 0815   HDL 80 03/08/2020 0815   CHOLHDL 1.8 03/08/2020 0815   VLDL 8 03/08/2020 0815   LDLCALC 55 03/08/2020 0815   LDLDIRECT 119.6 10/04/2007 0919    PHYSICAL EXAM:    VS:  BP (!) 178/82    Pulse 84    Ht 5\' 3"  (1.6 m)    Wt 128 lb 9.6 oz (58.3 kg)    SpO2 96%    BMI 22.78 kg/m   BMI: Body mass index is 22.78 kg/m.  GEN: Well nourished, well developed, in no acute distress HEENT: normocephalic, atraumatic Neck: no JVD, carotid bruits, or masses Cardiac: RRR; +murmurs, no rubs, or gallops, no edema  Respiratory:  clear to auscultation bilaterally, normal work of breathing GI: soft, nontender, nondistended, + BS MS: no deformity or atrophy Skin: warm and dry, no rash Neuro:  Alert and Oriented x 3, Strength and sensation are intact, follows commands Psych: euthymic mood, full affect  Wt Readings from Last 3 Encounters:  03/20/20 128 lb 9.6 oz (58.3  kg)  03/09/20 127 lb 12.8 oz (58 kg)  04/03/19 142 lb (64.4 kg)     ASSESSMENT & PLAN:   Cardiomyopathy - EF 60-65% in 2019 - Patient was recently admitted for tachycardia/palpitations. Echo showed EF 40-45% with apical dyskinesis. Cath showed no significant blockages. CM thought to be Takotsubo's vs HCM due to echo findings. Recommended possible cardiac MRI with follow-up echo in 3 months - Atenolol changed to metoprolol in the hospital. Continue BB - No ACE/ARB due to h/o of hives/swelling with ACE - No sob, LLE, CP, orthopnea - Patient has been mindful of stressors in  her life and trying to decrease this. Has good family support. - Follow-up in 4-6 weeks with Dr. Allyson Sabal to discuss cardiac MRI.  Chronic diastolic Heart Failure - EF 40-45% - euvolemic on exam. Not on diuretic at home.  - continue BB  HTN - Metoprolol 200 mg daily.  - Pressure elevated today, 178/82. Patient says she has not taken her medications. Recheck pressures was 156/78 - Patient has BP diary, pressures normally 140s/70s - will continue current regimen for now. Recommended patient take pills in the AM  Palpitations/tachycardia - Improved with metoprolol. No reoccurrence - TSH 0.339 during recent admission - Pulse in the 80s  HLD - pravastatin - LDL 55 03/08/2020 - goal<70  Moderate to severe MR - murmur on exam   Disposition: F/u with Dr. Allyson Sabal in 4-6 weeks   Medication Adjustments/Labs and Tests Ordered: Current medicines are reviewed at length with the patient today.  Concerns regarding medicines are outlined above. Medication changes, Labs and Tests ordered today are summarized above and listed in the Patient Instructions accessible in Encounters.   Signed, Daveon Arpino David Stall, PA-C  03/20/2020 12:18 PM    [facility] [address] [place], [place], [place]  [address] Phone: [phone]; Fax: [phone]

## 2020-05-08 ENCOUNTER — Ambulatory Visit: Payer: BLUE CROSS/BLUE SHIELD | Admitting: Cardiovascular Disease

## 2020-05-08 ENCOUNTER — Other Ambulatory Visit: Payer: Self-pay

## 2020-05-08 ENCOUNTER — Encounter: Payer: Self-pay | Admitting: Cardiovascular Disease

## 2020-05-08 DIAGNOSIS — I200 Unstable angina: Secondary | ICD-10-CM | POA: Diagnosis not present

## 2020-05-08 DIAGNOSIS — E785 Hyperlipidemia, unspecified: Secondary | ICD-10-CM | POA: Diagnosis not present

## 2020-05-08 MED ORDER — AMLODIPINE BESYLATE 10 MG PO TABS: 10.0000 mg | ORAL_TABLET | Freq: Every day | ORAL | 3 refills | Status: DC

## 2020-05-08 MED ORDER — ATENOLOL 50 MG PO TABS: 50.0000 mg | ORAL_TABLET | Freq: Two times a day (BID) | ORAL | 3 refills | Status: DC

## 2020-05-08 NOTE — Patient Instructions (Addendum)
Medication Instructions:  Stop Taking Metoprolol Resume taking Amlodipine 10 mg daily Resume taking Atenolol 50 mg daily  *If you need a refill on your cardiac medications before your next appointment, please call your pharmacy*   Testing/Procedures: Echocardiogram (next month) - Your physician has requested that you have an echocardiogram. Echocardiography is a painless test that uses sound waves to create images of your heart. It provides your doctor with information about the size and shape of your heart and how well your heart's chambers and valves are working. This procedure takes approximately one hour. There are no restrictions for this procedure. This will be performed at our [facility] location - [address].    Follow-Up: At [facility], you and your health needs are our priority.  As part of our continuing mission to provide you with exceptional heart care, we have created designated Provider Care Teams.  These Care Teams include your primary Cardiologist (physician) and Advanced Practice Providers (APPs -  Physician Assistants and Nurse Practitioners) who all work together to provide you with the care you need, when you need it.  We recommend signing up for the patient portal called "MyChart".  Sign up information is provided on this After Visit Summary.  MyChart is used to connect with patients for Virtual Visits (Telemedicine).  Patients are able to view lab/test results, encounter notes, upcoming appointments, etc.  Non-urgent messages can be sent to your provider as well.   To learn more about what you can do with MyChart, go to [email or web address].    Your next appointment:   Follow up with APP (PA, NP) in 6-8 weeks Follow up with Dr.Berry in 12 months.

## 2020-05-08 NOTE — Progress Notes (Signed)
05/08/2020 Sharon Munoz [place]   09-11-1956  [id number]  Primary Physician Tisovec, Adelfa Koh, MD Primary Cardiologist: Sharon Gess MD Sharon Munoz  HPI:  Sharon Munoz is a 64 y.o.  moderately overweight divorced American female mother of 1, grandmother of 2 grandchildren referred by Dr. Nehemiah Munoz for evaluation of palpitations.  I last saw her in the office 05/10/2018. She does have a history of treated hypertension and diabetes.  She is a former patient of Dr. Truett Munoz.  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  She is never had a heart attack or stroke.  She denies chest pain or shortness of breath.  Her PCP apparently began her on a diabetes medication which resulted in palpitations.  This has since been discontinued and her palpitations have resolved spontaneously.  She does have a soft outflow tract murmur.  She was admitted to the hospital chest pain 03/07/2020 and discharged 2 days later.  Her enzymes rose minimally.  2D echo showed apical wall motion and normalities with basal hypokinesia and a dynamic outflow tract gradient evaluated by Dr. Eden Munoz.  Cardiac cath was performed by Dr. Okey Munoz revealing normal coronary arteries.  The possibility of Takotsubo syndrome was raised as well as HOCM.  She has been asymptomatic since discharge.   Current Meds  Medication Sig  . aspirin EC 81 MG tablet Take 81 mg by mouth daily.  . Cholecalciferol (VITAMIN D3 PO) Take 1 capsule by mouth daily. [employer] Brand   . clindamycin (CLEOCIN) 2 % vaginal cream Place 1 Applicatorful vaginally at bedtime. For 6 days  . empagliflozin (JARDIANCE) 25 MG TABS tablet Take 25 mg by mouth daily.  Sharon Munoz escitalopram (LEXAPRO) 20 MG tablet Take 1 tablet (20 mg total) by mouth daily.  Sharon Munoz gabapentin (NEURONTIN) 100 MG capsule Take 3 capsules (300 mg total) by mouth 3 (three) times daily. (Patient taking differently: Take 100 mg by mouth daily. )  . MAGNESIUM PO Take 1 tablet by mouth daily at 12  noon.  . metoprolol succinate (TOPROL-XL) 200 MG 24 hr tablet Take 1 tablet (200 mg total) by mouth daily. Take with or immediately following a meal.  . pravastatin (PRAVACHOL) 20 MG tablet Take 20 mg by mouth daily.  . SitaGLIPtin-MetFORMIN HCl (JANUMET XR) 50-1000 MG TB24 Take 1 tablet by mouth in the morning and at bedtime.     Allergies  Allergen Reactions  . Ace Inhibitors Itching and Swelling  . Citrus Hives, Swelling and Other (See Comments)    Pt says she gets knots.   . Codeine Nausea And Vomiting    Excessive vomiting  . Lisinopril Hives  . Other Itching, Swelling and Other (See Comments)    Any type of nut Pt will get mouth blisters  . Percocet [Oxycodone-Acetaminophen]     Disoriented, difficulty waking up and hard to focus, vomiting  . Propoxyphene Other (See Comments)    Darvocet (Propoxyphene-Acetaminophen).  Causes Disorientation, difficulty waking up, difficulty focusing, and vomiting.  Alma Friendly [Sitagliptin] Itching and Palpitations    Social History   Socioeconomic History  . Marital status: Legally Separated    Spouse name: Not on file  . Number of children: Not on file  . Years of education: Not on file  . Highest education level: Not on file  Occupational History  . 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 Smoking status: Never Smoker  . Smokeless  tobacco: Never Used  Substance and Sexual Activity  . Alcohol use: No    Alcohol/week: 0.0 standard drinks  . Drug use: Never  . Sexual activity: Not on file  Other Topics Concern  . Not on file  Social History Narrative  . Not on file   Social Determinants of Health   Financial Resource Strain:   . Difficulty of Paying Living Expenses:   Food Insecurity:   . Worried About Charity fundraiser in the Last Year:   . Arboriculturist in the Last Year:   Transportation Needs:   . Film/video editor (Medical):   Sharon Munoz Lack of Transportation (Non-Medical):    Physical Activity:   . Days of Exercise per Week:   . Minutes of Exercise per Session:   Stress:   . Feeling of Stress :   Social Connections:   . Frequency of Communication with Friends and Family:   . Frequency of Social Gatherings with Friends and Family:   . Attends Religious Services:   . Active Member of Clubs or Organizations:   . Attends Archivist Meetings:   Sharon Munoz Marital Status:   Intimate Partner Violence:   . Fear of Current or Ex-Partner:   . Emotionally Abused:   Sharon Munoz Physically Abused:   . Sexually Abused:      Review of Systems: General: negative for chills, fever, night sweats or weight changes.  Cardiovascular: negative for chest pain, dyspnea on exertion, edema, orthopnea, palpitations, paroxysmal nocturnal dyspnea or shortness of breath Dermatological: negative for rash Respiratory: negative for cough or wheezing Urologic: negative for hematuria Abdominal: negative for nausea, vomiting, diarrhea, bright red blood per rectum, melena, or hematemesis Neurologic: negative for visual changes, syncope, or dizziness All other systems reviewed and are otherwise negative except as noted above.    Blood pressure (!) 178/100, pulse 74, height 5\' 3"  (1.6 m), weight 128 lb 9.6 oz (58.3 kg), SpO2 96 %.  General appearance: alert and no distress Neck: no adenopathy, no carotid bruit, no JVD, supple, symmetrical, trachea midline and thyroid not enlarged, symmetric, no tenderness/mass/nodules Lungs: clear to auscultation bilaterally Heart: regular rate and rhythm, S1, S2 normal, no murmur, click, rub or gallop Extremities: extremities normal, atraumatic, no cyanosis or edema Pulses: 2+ and symmetric Skin: Skin color, texture, turgor normal. No rashes or lesions Neurologic: Alert and oriented X 3, normal strength and tone. Normal symmetric reflexes. Normal coordination and gait  EKG not performed today  ASSESSMENT AND PLAN:   Dyslipidemia History of essential  hypertension blood pressure measured today 170/100.  She ran out of her metoprolol.  She was on amlodipine and atenolol as well as lisinopril in the past but apparently is intolerant to lisinopril secondary to allergy.  Go to put her back on her amlodipine and atenolol  Chest pain Recent mission for chest pain in April with mildly positive enzymes and cardiac cath performed by Dr. Saunders Revel revealing normal coronary arteries.  She did have some mild abnormalities on her 2D echocardiogram suggesting "Takotsubo syndrome with an outflow tract gradient which was dynamic.  The possibility of HOCM was raised.  We will recheck a 2D echo next month.      Lorretta Harp MD FACP,FACC,FAHA, [facility] 05/08/2020 11:43 AM

## 2020-05-08 NOTE — Assessment & Plan Note (Signed)
History of essential hypertension blood pressure measured today 170/100.  She ran out of her metoprolol.  She was on amlodipine and atenolol as well as lisinopril in the past but apparently is intolerant to lisinopril secondary to allergy.  Go to put her back on her amlodipine and atenolol

## 2020-05-08 NOTE — Assessment & Plan Note (Signed)
Recent mission for chest pain in April with mildly positive enzymes and cardiac cath performed by Dr. Okey Dupre revealing normal coronary arteries.  She did have some mild abnormalities on her 2D echocardiogram suggesting "Takotsubo syndrome with an outflow tract gradient which was dynamic.  The possibility of HOCM was raised.  We will recheck a 2D echo next month.

## 2020-06-05 ENCOUNTER — Ambulatory Visit (HOSPITAL_COMMUNITY): Payer: BLUE CROSS/BLUE SHIELD | Attending: Cardiology

## 2020-06-05 ENCOUNTER — Other Ambulatory Visit: Payer: Self-pay

## 2020-06-05 DIAGNOSIS — I200 Unstable angina: Secondary | ICD-10-CM | POA: Diagnosis present

## 2020-06-12 ENCOUNTER — Telehealth: Payer: Self-pay

## 2020-06-12 NOTE — Telephone Encounter (Signed)
Spoke to patient echo results given.Stated she will be going to dentist and she normally takes a antibiotic before she goes due to her heart murmur.Advised she does not require antibiotic.Advised I will send message to Dr.Berry.

## 2020-06-16 NOTE — Telephone Encounter (Signed)
No need for ATBX prophylaxis based on recent echo

## 2020-06-17 NOTE — Telephone Encounter (Signed)
Called patient left Dr.Berry's advice on personal voice mail. 

## 2020-06-25 ENCOUNTER — Ambulatory Visit: Payer: BLUE CROSS/BLUE SHIELD | Admitting: General Practice

## 2020-06-25 ENCOUNTER — Encounter: Payer: Self-pay | Admitting: *Deleted

## 2020-06-30 NOTE — Progress Notes (Signed)
Cardiology Clinic Note   Patient Name: Sharon Munoz Date of Encounter: 07/01/2020  Primary Care Provider:  [name], MD Primary Cardiologist:  Nanetta Batty, MD  Patient Profile    Sharon Munoz 64 year old female presents to the clinic today for follow-up evaluation of her Takotsubo cardiomyopathy.  Underwent cardiac catheterization 03/09/2020 which showed normal coronary arteries.  Past Medical History    Past Medical History:  Diagnosis Date  . Back spasm   . Diabetes mellitus   . Hypertension   . Systolic ejection murmur 1986   Past Surgical History:  Procedure Laterality Date  . CESAREAN SECTION    . RIGHT/LEFT HEART CATH AND CORONARY ANGIOGRAPHY N/A 03/08/2020   Procedure: RIGHT/LEFT HEART CATH AND CORONARY ANGIOGRAPHY;  Surgeon: Yvonne Kendall, MD;  Location: [facility];  Service: Cardiovascular;  Laterality: N/A;    Allergies  Allergies  Allergen Reactions  . Ace Inhibitors Itching and Swelling  . Citrus Hives, Swelling and Other (See Comments)    Pt says she gets knots.   . Codeine Nausea And Vomiting    Excessive vomiting  . Lisinopril Hives  . Other Itching, Swelling and Other (See Comments)    Any type of nut Pt will get mouth blisters  . Percocet [Oxycodone-Acetaminophen]     Disoriented, difficulty waking up and hard to focus, vomiting  . Propoxyphene Other (See Comments)    Darvocet (Propoxyphene-Acetaminophen).  Causes Disorientation, difficulty waking up, difficulty focusing, and vomiting.  Alma Friendly [Sitagliptin] Itching and Palpitations    History of Present Illness    Sharon Munoz is a PMH of essential hypertension, cardiomyopathy, chronic rhinitis, GERD, diabetes, dyslipidemia, hypokalemia, cardiac murmur, angioedema, palpitations, and chest pain.  She was admitted to the hospital on 03/07/2020 with chest pain and discharged 2 days later.  Her cardiac enzymes elevated slightly.  Echocardiogram showed apical wall motion  normalities and basal hypokinesis.  Cardiac catheterization showed normal coronary arteries felt to be Takotsubo syndrome.  She was last seen by Dr. Allyson Sabal on 05/08/2020.  During that time she denied cardiac symptoms and had been asymptomatic since discharge.  She presents to the clinic today for follow-up evaluation and states she feels well.  She continues to be very physically active at home doing cardiovascular type exercise.  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  She works around 20 hours of work per week.  She states she also cares for her mother who is elderly.  She indicates that she has some dietary indiscretion and eats out frequently.  She has been monitoring her blood pressure at home and states that it typically runs in the 140s over the 80s.  I will add 12.5 of hydrochlorothiazide to her medication regimen today.  We will order a BMP and have her follow-up in 1 month.  Today she denies chest pain, shortness of breath, lower extremity edema, fatigue, palpitations, melena, hematuria, hemoptysis, diaphoresis, weakness, presyncope, syncope, orthopnea, and PND.     Home Medications    Prior to Admission medications   Medication Sig Start Date End Date Taking? Authorizing Provider  amLODipine (NORVASC) 10 MG tablet Take 1 tablet (10 mg total) by mouth daily. 05/08/20 08/06/20  Runell Gess, MD  aspirin EC 81 MG tablet Take 81 mg by mouth daily.    [provider]  atenolol (TENORMIN) 50 MG tablet Take 1 tablet (50 mg total) by mouth 2 (two) times daily. 05/08/20   Nanetta Batty  J, MD  Cholecalciferol (VITAMIN D3 PO) Take 1 capsule by mouth daily. [employer] Brand     [provider]  clindamycin (CLEOCIN) 2 % vaginal cream Place 1 Applicatorful vaginally at bedtime. For 6 days    [provider]  empagliflozin (JARDIANCE) 25 MG TABS tablet Take 25 mg by mouth daily.    [provider]  escitalopram (LEXAPRO) 20 MG  tablet Take 1 tablet (20 mg total) by mouth daily. 03/09/20 03/09/21  Rhetta MuraSamtani, Jai-Gurmukh, MD  gabapentin (NEURONTIN) 100 MG capsule Take 3 capsules (300 mg total) by mouth 3 (three) times daily. Patient taking differently: Take 100 mg by mouth daily.  02/12/16   Levert FeinsteinYan, Yijun, MD  MAGNESIUM PO Take 1 tablet by mouth daily at 12 noon.    [provider]  pravastatin (PRAVACHOL) 20 MG tablet Take 20 mg by mouth daily.    [provider]  SitaGLIPtin-MetFORMIN HCl (JANUMET XR) 50-1000 MG TB24 Take 1 tablet by mouth in the morning and at bedtime.    [provider]    Family History    Family History  Problem Relation Age of Onset  . Asthma Father   . Hypertension Father   . Kidney cancer Father   . CVA Father [id number]  . Asthma Brother   . Colon cancer Brother [id number]  . Hypertension Mother   . Eczema Neg Hx   . Immunodeficiency Neg Hx   . Urticaria Neg Hx    She indicated that the status of her mother is unknown. She indicated that her father is deceased. She indicated that her sister is alive. She indicated that her brother is deceased. She indicated that the status of her neg hx is unknown.  Social History    Social History   Socioeconomic History  . Marital status: Legally Separated    Spouse name: Not on file  . Number of children: Not on file  . Years of education: Not on file  . Highest education level: Not on file  Occupational History  . 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 Smoking status: Never Smoker  . Smokeless tobacco: Never Used  Substance and Sexual Activity  . Alcohol use: No    Alcohol/week: 0.0 standard drinks  . Drug use: Never  . Sexual activity: Not Currently  Other Topics Concern  . Not on file  Social History Narrative  . Not on file   Social Determinants of Health   Financial Resource Strain:   . Difficulty of Paying Living Expenses:   Food Insecurity:   . Worried About Brewing technologistunning Out of  Food in the Last Year:   . Baristaan Out of Food in the Last Year:   Transportation Needs:   . Freight forwarderLack of Transportation (Medical):   Marland Kitchen. Lack of Transportation (Non-Medical):   Physical Activity:   . Days of Exercise per Week:   . Minutes of Exercise per Session:   Stress:   . Feeling of Stress :   Social Connections:   . Frequency of Communication with Friends and Family:   . Frequency of Social Gatherings with Friends and Family:   . Attends Religious Services:   . Active Member of Clubs or Organizations:   . Attends BankerClub or Organization Meetings:   Marland Kitchen. Marital Status:   Intimate Partner Violence:   . Fear of Current or Ex-Partner:   . Emotionally Abused:   Marland Kitchen. Physically Abused:   . Sexually Abused:  Review of Systems    General:  No chills, fever, night sweats or weight changes.  Cardiovascular:  No chest pain, dyspnea on exertion, edema, orthopnea, palpitations, paroxysmal nocturnal dyspnea. Dermatological: No rash, lesions/masses Respiratory: No cough, dyspnea Urologic: No hematuria, dysuria Abdominal:   No nausea, vomiting, diarrhea, bright red blood per rectum, melena, or hematemesis Neurologic:  No visual changes, wkns, changes in mental status. All other systems reviewed and are otherwise negative except as noted above.  Physical Exam    VS:  BP 136/80   Pulse 67   Ht 5\' 3"  (1.6 m)   Wt 129 lb (58.5 kg)   BMI 22.85 kg/m  , BMI Body mass index is 22.85 kg/m. GEN: Well nourished, well developed, in no acute distress. HEENT: normal. Neck: Supple, no JVD, carotid bruits, or masses. Cardiac: RRR, no murmurs, rubs, or gallops. No clubbing, cyanosis, edema.  Radials/DP/PT 2+ and equal bilaterally.  Respiratory:  Respirations regular and unlabored, clear to auscultation bilaterally. GI: Soft, nontender, nondistended, BS + x 4. MS: no deformity or atrophy. Skin: warm and dry, no rash. Neuro:  Strength and sensation are intact. Psych: Normal affect.  Accessory Clinical  Findings    Recent Labs: 03/08/2020: TSH 0.339 03/09/2020: BUN 13; Creatinine, Ser 0.61; Hemoglobin 13.2; Platelets 310; Potassium 3.6; Sodium 140   Recent Lipid Panel    Component Value Date/Time   CHOL 143 03/08/2020 0815   TRIG 41 03/08/2020 0815   HDL 80 03/08/2020 0815   CHOLHDL 1.8 03/08/2020 0815   VLDL 8 03/08/2020 0815   LDLCALC 55 03/08/2020 0815   LDLDIRECT 119.6 10/04/2007 0919    ECG personally reviewed by me today-none today.  Echocardiogram 06/05/2020 IMPRESSIONS    1. Left ventricular ejection fraction, by estimation, is 60 to 65%. The  left ventricle has normal function. The left ventricle has no regional  wall motion abnormalities. There is mild asymmetric left ventricular  hypertrophy of the basal-septal segment.  Left ventricular diastolic parameters are indeterminate.  2. Right ventricular systolic function is normal. The right ventricular  size is normal.  3. The mitral valve is normal in structure. Trivial mitral valve  regurgitation. No evidence of mitral stenosis.  4. The aortic valve is tricuspid. Aortic valve regurgitation is trivial.  Mild aortic valve sclerosis is present, with no evidence of aortic valve  stenosis.  5. The inferior vena cava is normal in size with greater than 50%  respiratory variability, suggesting right atrial pressure of 3 mmHg.   Conclusion(s)/Recommendation(s): Compared to prior study, EF has  normalized. There is no longer systolic anterior motion of the mitral  leaflet, and MR is improved.  Cardiac catheterization 03/08/2020  1. Tortuous coronary arteries without angiographically significant stenosis. 2. Upper normal to mildly elevated left heart and pulmonary artery pressures. 3. Normal right heart filling pressures. 4. Normal cardiac output/index. 5. Dynamic LVOT gradient of 25 mmHg at rest and 65-70 mmHg post-PVC.  Recommendations: 1. Primary prevention of coronary artery disease. 2. Hold amlodipine and  increase metoprolol tartrate to 100 mg twice daily to improve rate control.   Assessment & Plan   1.  Chest pain-no chest pain today.  Underwent cardiac catheterization 03/08/2020 which showed normal coronary arteries.  Believed to be possibly Takotsubo syndrome Continue amlodipine, atenolol Heart healthy low-sodium diet-salty 6 given Increase physical activity as tolerated   Essential hypertension-BP today 136/80.  Reports that her blood pressure at home has been in the 140s over 70s-80s continue lisinopril, amlodipine, atenolol Heart healthy  low-sodium diet-salty 6 given Increase physical activity as tolerated Start hydrochlorothiazide 12.5 Repeat BMP in 2 weeks.  Hyperlipidemia-LDL 55 on 03/08/2020 Continue pravastatin Heart healthy low-sodium diet-salty 6 given Increase physical activity as tolerated   Disposition: Follow-up with Dr. Allyson Sabal in 6 months.  Thomasene Ripple. Robertt Buda NP-C    07/01/2020, 3:16 PM [facility] [address] Office [phone] Fax [phone]  Notice: This dictation was prepared with Dragon dictation along with smaller phrase technology. Any transcriptional errors that result from this process are unintentional and may not be corrected upon review.

## 2020-07-01 ENCOUNTER — Encounter: Payer: Self-pay | Admitting: General Practice

## 2020-07-01 ENCOUNTER — Other Ambulatory Visit: Payer: Self-pay

## 2020-07-01 ENCOUNTER — Ambulatory Visit: Payer: BLUE CROSS/BLUE SHIELD | Admitting: General Practice

## 2020-07-01 VITALS — BP 136/80 | HR 67 | Ht 63.0 in | Wt 129.0 lb

## 2020-07-01 DIAGNOSIS — I10 Essential (primary) hypertension: Secondary | ICD-10-CM | POA: Diagnosis not present

## 2020-07-01 DIAGNOSIS — E785 Hyperlipidemia, unspecified: Secondary | ICD-10-CM | POA: Diagnosis not present

## 2020-07-01 DIAGNOSIS — I200 Unstable angina: Secondary | ICD-10-CM

## 2020-07-01 MED ORDER — HYDROCHLOROTHIAZIDE 12.5 MG PO CAPS: 12.5000 mg | ORAL_CAPSULE | Freq: Every day | ORAL | 3 refills | Status: DC

## 2020-07-01 NOTE — Patient Instructions (Addendum)
Medication Instructions:  Start 12.5 HCTZ  ( Hydrochlorothiazide)  ( 1 Tablet Daily) *If you need a refill on your cardiac medications before your next appointment, please call your pharmacy*   Lab Work: BMP in 2 weeks If you have labs (blood work) drawn today and your tests are completely normal, you will receive your results only by: Marland Kitchen MyChart Message (if you have MyChart) OR . A paper copy in the mail If you have any lab test that is abnormal or we need to change your treatment, we will call you to review the results.   Testing/Procedures: None   Follow-Up: At [facility], you and your health needs are our priority.  As part of our continuing mission to provide you with exceptional heart care, we have created designated Provider Care Teams.  These Care Teams include your primary Cardiologist (physician) and Advanced Practice Providers (APPs -  Physician Assistants and Nurse Practitioners) who all work together to provide you with the care you need, when you need it.  We recommend signing up for the patient portal called "MyChart".  Sign up information is provided on this After Visit Summary.  MyChart is used to connect with patients for Virtual Visits (Telemedicine).  Patients are able to view lab/test results, encounter notes, upcoming appointments, etc.  Non-urgent messages can be sent to your provider as well.   To learn more about what you can do with MyChart, go to [email or web address].    Your next appointment:   1 month(s)  The format for your next appointment:   In Person  Provider:   Edd Fabian NP  Other Instructions [place]

## 2020-08-05 NOTE — Progress Notes (Signed)
Cardiology Clinic Note   Patient Name: Sharon Munoz Date of Encounter: 08/06/2020  Primary Care Provider:  [name], MD Primary Cardiologist:  Nanetta Batty, MD  Patient Profile    Sharon Munoz 64 year old female presents to the clinic today for follow-up evaluation of her Takotsubo cardiomyopathy.    Previous cardiac catheterization 03/09/2020 which showed normal coronary arteries.  Past Medical History    Past Medical History:  Diagnosis Date  . Back spasm   . Diabetes mellitus   . Hypertension   . Systolic ejection murmur 1986   Past Surgical History:  Procedure Laterality Date  . CESAREAN SECTION    . RIGHT/LEFT HEART CATH AND CORONARY ANGIOGRAPHY N/A 03/08/2020   Procedure: RIGHT/LEFT HEART CATH AND CORONARY ANGIOGRAPHY;  Surgeon: Yvonne Kendall, MD;  Location: [facility];  Service: Cardiovascular;  Laterality: N/A;    Allergies  Allergies  Allergen Reactions  . Ace Inhibitors Itching and Swelling  . Citrus Hives, Swelling and Other (See Comments)    Pt says she gets knots.   . Codeine Nausea And Vomiting    Excessive vomiting  . Lisinopril Hives  . Other Itching, Swelling and Other (See Comments)    Any type of nut Pt will get mouth blisters  . Percocet [Oxycodone-Acetaminophen]     Disoriented, difficulty waking up and hard to focus, vomiting  . Propoxyphene Other (See Comments)    Darvocet (Propoxyphene-Acetaminophen).  Causes Disorientation, difficulty waking up, difficulty focusing, and vomiting.  Alma Friendly [Sitagliptin] Itching and Palpitations    History of Present Illness    Ms. Lozano is a PMH of essential hypertension, cardiomyopathy, chronic rhinitis, GERD, diabetes, dyslipidemia, hypokalemia, cardiac murmur, angioedema, palpitations, and chest pain.  She was admitted to the hospital on 03/07/2020 with chest pain and discharged 2 days later.  Her cardiac enzymes elevated slightly.  Echocardiogram showed apical wall motion  normalities and basal hypokinesis.  Cardiac catheterization showed normal coronary arteries felt to be Takotsubo syndrome.  She was last seen by Dr. Allyson Sabal on 05/08/2020.  During that time she denied cardiac symptoms and had been asymptomatic since discharge.  She presented to the clinic 07/01/20 for follow-up evaluation and stated she felt well.  She continued to be very physically active at home doing cardiovascular type exercise.  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  She worked around 20 hours of  per week.  She stated she also cared for her mother who is elderly.  She indicated that she had some dietary indiscretion and eats out frequently.  She had been monitoring her blood pressure at home and stated that it typically ran in the 140s over the 80s.  I added 12.5 of hydrochlorothiazide to her medication regimen.  We ordered a BMP and had her follow-up in 1 month.  She presents to the clinic today for follow-up evaluation and states she feels well.  She has begun exercising more.  She has much better control blood pressure.  Her blood pressure today is 125/74.  She continues to have allergic reactions which she describes as causing hives on her forearms and legs.  She indicates that she feels like it may be related to her evening medications.  However, she takes the same medications through the day and does not have any sort of reactions.  We discussed alternate reasons for reaction such as food/environmental.  She will continue to monitor.  I will order a BMP today, refill her  atenolol, and have her follow-up in 6 months.  Today she denies chest pain, shortness of breath, lower extremity edema, fatigue, palpitations, melena, hematuria, hemoptysis, diaphoresis, weakness, presyncope, syncope, orthopnea, and PND.  Home Medications    Prior to Admission medications   Medication Sig Start Date End Date Taking? Authorizing Provider  amLODipine (NORVASC) 10 MG tablet  Take 1 tablet (10 mg total) by mouth daily. 05/08/20 08/06/20  Runell GessBerry, Jonathan J, MD  aspirin EC 81 MG tablet Take 81 mg by mouth daily.    [provider]  atenolol (TENORMIN) 50 MG tablet Take 1 tablet (50 mg total) by mouth 2 (two) times daily. 05/08/20   Runell GessBerry, Jonathan J, MD  CALCIUM PO Take 1 capsule by mouth daily.    [provider]  Cholecalciferol (VITAMIN D3 PO) Take 1 capsule by mouth daily. [employer] Brand     [provider]  empagliflozin (JARDIANCE) 25 MG TABS tablet Take 25 mg by mouth daily. Patient not taking: Reported on 07/01/2020    [provider]  escitalopram (LEXAPRO) 20 MG tablet Take 1 tablet (20 mg total) by mouth daily. 03/09/20 03/09/21  Rhetta MuraSamtani, Jai-Gurmukh, MD  gabapentin (NEURONTIN) 100 MG capsule Take 3 capsules (300 mg total) by mouth 3 (three) times daily. Patient taking differently: Take 100 mg by mouth daily.  02/12/16   Levert FeinsteinYan, Yijun, MD  hydrochlorothiazide (MICROZIDE) 12.5 MG capsule Take 1 capsule (12.5 mg total) by mouth daily. 07/01/20 09/29/20  Ronney Astersleaver, Nesanel Aguila M, NP  MAGNESIUM PO Take 1 tablet by mouth daily at 12 noon.    [provider]  Multiple Vitamins-Minerals (HAIR SKIN NAILS PO) Take 1 capsule by mouth daily.    [provider]  pravastatin (PRAVACHOL) 20 MG tablet Take 20 mg by mouth daily.    [provider]  TRESIBA FLEXTOUCH 100 UNIT/ML FlexTouch Pen Inject 20 Units into the skin daily. 06/20/20   [provider]    Family History    Family History  Problem Relation Age of Onset  . Asthma Father   . Hypertension Father   . Kidney cancer Father   . CVA Father [id number]  . Asthma Brother   . Colon cancer Brother [id number]  . Hypertension Mother   . Eczema Neg Hx   . Immunodeficiency Neg Hx   . Urticaria Neg Hx    She indicated that the status of her mother is unknown. She indicated that her father is deceased. She indicated that her sister is alive. She indicated that her brother is  deceased. She indicated that the status of her neg hx is unknown.  Social History    Social History   Socioeconomic History  . Marital status: Legally Separated    Spouse name: Not on file  . Number of children: Not on file  . Years of education: Not on file  . Highest education level: Not on file  Occupational History  . 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 Smoking status: Never Smoker  . Smokeless tobacco: Never Used  Substance and Sexual Activity  . Alcohol use: No    Alcohol/week: 0.0 standard drinks  . Drug use: Never  . Sexual activity: Not Currently  Other Topics Concern  . Not on file  Social History Narrative  . Not on file   Social Determinants of Health   Financial Resource Strain:   . Difficulty of Paying Living Expenses: Not on file  Food Insecurity:   .  Worried About Programme researcher, broadcasting/film/video in the Last Year: Not on file  . Ran Out of Food in the Last Year: Not on file  Transportation Needs:   . Lack of Transportation (Medical): Not on file  . Lack of Transportation (Non-Medical): Not on file  Physical Activity:   . Days of Exercise per Week: Not on file  . Minutes of Exercise per Session: Not on file  Stress:   . Feeling of Stress : Not on file  Social Connections:   . Frequency of Communication with Friends and Family: Not on file  . Frequency of Social Gatherings with Friends and Family: Not on file  . Attends Religious Services: Not on file  . Active Member of Clubs or Organizations: Not on file  . Attends Banker Meetings: Not on file  . Marital Status: Not on file  Intimate Partner Violence:   . Fear of Current or Ex-Partner: Not on file  . Emotionally Abused: Not on file  . Physically Abused: Not on file  . Sexually Abused: Not on file     Review of Systems    General:  No chills, fever, night sweats or weight changes.  Cardiovascular:  No chest pain, dyspnea on exertion, edema, orthopnea,  palpitations, paroxysmal nocturnal dyspnea. Dermatological: No rash, lesions/masses Respiratory: No cough, dyspnea Urologic: No hematuria, dysuria Abdominal:   No nausea, vomiting, diarrhea, bright red blood per rectum, melena, or hematemesis Neurologic:  No visual changes, wkns, changes in mental status. All other systems reviewed and are otherwise negative except as noted above.  Physical Exam    VS:  BP 125/74   Pulse 60   Temp 98 F (36.7 C)   Ht 5\' 8"  (1.727 m)   Wt 183 lb 6.4 oz (83.2 kg)   SpO2 98%   BMI 27.89 kg/m  , BMI Body mass index is 27.89 kg/m. GEN: Well nourished, well developed, in no acute distress. HEENT: normal. Neck: Supple, no JVD, carotid bruits, or masses. Cardiac: RRR, no murmurs, rubs, or gallops. No clubbing, cyanosis, edema.  Radials/DP/PT 2+ and equal bilaterally.  Respiratory:  Respirations regular and unlabored, clear to auscultation bilaterally. GI: Soft, nontender, nondistended, BS + x 4. MS: no deformity or atrophy. Skin: warm and dry, no rash. Neuro:  Strength and sensation are intact. Psych: Normal affect.  Accessory Clinical Findings    Recent Labs: 03/08/2020: TSH 0.339 03/09/2020: BUN 13; Creatinine, Ser 0.61; Hemoglobin 13.2; Platelets 310; Potassium 3.6; Sodium 140   Recent Lipid Panel    Component Value Date/Time   CHOL 143 03/08/2020 0815   TRIG 41 03/08/2020 0815   HDL 80 03/08/2020 0815   CHOLHDL 1.8 03/08/2020 0815   VLDL 8 03/08/2020 0815   LDLCALC 55 03/08/2020 0815   LDLDIRECT 119.6 10/04/2007 0919    ECG personally reviewed by me today-normal sinus rhythm possible left atrial enlargement nonspecific T wave abnormality 60 bpm  Echocardiogram 06/05/2020 IMPRESSIONS    1. Left ventricular ejection fraction, by estimation, is 60 to 65%. The  left ventricle has normal function. The left ventricle has no regional  wall motion abnormalities. There is mild asymmetric left ventricular  hypertrophy of the basal-septal  segment.  Left ventricular diastolic parameters are indeterminate.  2. Right ventricular systolic function is normal. The right ventricular  size is normal.  3. The mitral valve is normal in structure. Trivial mitral valve  regurgitation. No evidence of mitral stenosis.  4. The aortic valve is tricuspid. Aortic valve regurgitation  is trivial.  Mild aortic valve sclerosis is present, with no evidence of aortic valve  stenosis.  5. The inferior vena cava is normal in size with greater than 50%  respiratory variability, suggesting right atrial pressure of 3 mmHg.   Conclusion(s)/Recommendation(s): Compared to prior study, EF has  normalized. There is no longer systolic anterior motion of the mitral  leaflet, and MR is improved.  Cardiac catheterization 03/08/2020  1. Tortuous coronary arteries without angiographically significant stenosis. 2. Upper normal to mildly elevated left heart and pulmonary artery pressures. 3. Normal right heart filling pressures. 4. Normal cardiac output/index. 5. Dynamic LVOT gradient of 25 mmHg at rest and 65-70 mmHg post-PVC.  Recommendations: 1. Primary prevention of coronary artery disease. 2. Hold amlodipine and increase metoprolol tartrate to 100 mg twice daily to improve rate control.  Assessment & Plan   1. Essential hypertension-BP today 125/74.    Well-controlled at home.   Continue lisinopril, amlodipine, atenolol Heart healthy low-sodium diet-salty 6 given Increase physical activity as tolerated Continue hydrochlorothiazide 12.5 Repeat BMP   Chest pain-no chest pain today.  Underwent cardiac catheterization 03/08/2020 which showed normal coronary arteries.  Believed to be possibly Takotsubo syndrome Continue amlodipine, atenolol Heart healthy low-sodium diet-salty 6 given Increase physical activity as tolerated  Hyperlipidemia-LDL 55 on 03/08/2020 Continue pravastatin Heart healthy low-sodium diet-salty 6 given Increase physical  activity as tolerated   Disposition: Follow-up with Dr. Allyson Sabal in 6 months.   Thomasene Ripple. Liz Pinho NP-C    08/06/2020, 11:00 AM [facility] [address] Office [phone] Fax [phone]  Notice: This dictation was prepared with Dragon dictation along with smaller phrase technology. Any transcriptional errors that result from this process are unintentional and may not be corrected upon review.

## 2020-08-06 ENCOUNTER — Ambulatory Visit (INDEPENDENT_AMBULATORY_CARE_PROVIDER_SITE_OTHER): Payer: BLUE CROSS/BLUE SHIELD | Admitting: General Practice

## 2020-08-06 ENCOUNTER — Telehealth: Payer: Self-pay | Admitting: General Practice

## 2020-08-06 ENCOUNTER — Other Ambulatory Visit: Payer: Self-pay

## 2020-08-06 ENCOUNTER — Encounter: Payer: Self-pay | Admitting: General Practice

## 2020-08-06 VITALS — BP 125/74 | HR 60 | Temp 98.0°F | Ht 63.0 in | Wt 133.0 lb

## 2020-08-06 DIAGNOSIS — I10 Essential (primary) hypertension: Secondary | ICD-10-CM | POA: Diagnosis not present

## 2020-08-06 DIAGNOSIS — I200 Unstable angina: Secondary | ICD-10-CM

## 2020-08-06 DIAGNOSIS — E785 Hyperlipidemia, unspecified: Secondary | ICD-10-CM

## 2020-08-06 LAB — BASIC METABOLIC PANEL
BUN/Creatinine Ratio: 19 (ref 12–28)
BUN: 9 mg/dL (ref 8–27)
CO2: 29 mmol/L (ref 20–29)
Calcium: 9.3 mg/dL (ref 8.7–10.3)
Chloride: 103 mmol/L (ref 96–106)
Creatinine, Ser: 0.47 mg/dL — ABNORMAL LOW (ref 0.57–1.00)
GFR calc Af Amer: 121 mL/min/{1.73_m2} (ref >59)
GFR calc non Af Amer: 105 mL/min/{1.73_m2} (ref >59)
Glucose: 132 mg/dL — ABNORMAL HIGH (ref 65–99)
Potassium: 3.4 mmol/L — ABNORMAL LOW (ref 3.5–5.2)
Sodium: 143 mmol/L (ref 134–144)

## 2020-08-06 MED ORDER — ATENOLOL 50 MG PO TABS: 50.0000 mg | ORAL_TABLET | Freq: Two times a day (BID) | ORAL | 3 refills | Status: DC

## 2020-08-06 NOTE — Patient Instructions (Signed)
Medication Instructions:  No Changes In Medications at this time.  *If you need a refill on your cardiac medications before your next appointment, please call your pharmacy*  Lab Work: BMET- please go by lab today to have this drawn.  If you have labs (blood work) drawn today and your tests are completely normal, you will receive your results only by:  MyChart Message (if you have MyChart) OR  A paper copy in the mail If you have any lab test that is abnormal or we need to change your treatment, we will call you to review the results.  Testing/Procedures: None Ordered At This Time.   Follow-Up: At [facility], you and your health needs are our priority.  As part of our continuing mission to provide you with exceptional heart care, we have created designated Provider Care Teams.  These Care Teams include your primary Cardiologist (physician) and Advanced Practice Providers (APPs -  Physician Assistants and Nurse Practitioners) who all work together to provide you with the care you need, when you need it.  We recommend signing up for the patient portal called "MyChart".  Sign up information is provided on this After Visit Summary.  MyChart is used to connect with patients for Virtual Visits (Telemedicine).  Patients are able to view lab/test results, encounter notes, upcoming appointments, etc.  Non-urgent messages can be sent to your provider as well.   To learn more about what you can do with MyChart, go to [email or web address].    Your next appointment:   6 month(s)  The format for your next appointment:   In Person  Provider:   Nanetta Batty, MD

## 2020-08-06 NOTE — Telephone Encounter (Signed)
Patient is calling for lab results.

## 2020-08-06 NOTE — Telephone Encounter (Signed)
Pt updated with lab results and verbalized understanding.  

## 2020-09-23 ENCOUNTER — Ambulatory Visit: Payer: BLUE CROSS/BLUE SHIELD | Attending: Internal Medicine

## 2020-09-23 DIAGNOSIS — Z23 Encounter for immunization: Secondary | ICD-10-CM

## 2020-09-23 NOTE — Progress Notes (Signed)
   Covid-19 Vaccination Clinic  Name:  Sharon Munoz    MRN: [id number] DOB: 18-Mar-1956  09/23/2020  Sharon Munoz was observed post Covid-19 immunization for 30 minutes based on pre-vaccination screening without incident. She was provided with Vaccine Information Sheet and instruction to access the V-Safe system.   Sharon Munoz was instructed to call 911 with any severe reactions post vaccine: Marland Kitchen Difficulty breathing  . Swelling of face and throat  . A fast heartbeat  . A bad rash all over body  . Dizziness and weakness   Immunizations Administered    Name Date Dose VIS Date Route   Pfizer COVID-19 Vaccine 09/23/2020 12:03 PM 0.3 mL 01/24/2019 Intramuscular   Manufacturer: [employer], [employer]   Lot: [id number]   NDC: [address]-1

## 2021-06-07 ENCOUNTER — Other Ambulatory Visit: Payer: Self-pay | Admitting: Cardiovascular Disease

## 2021-08-12 ENCOUNTER — Other Ambulatory Visit: Payer: Self-pay | Admitting: General Practice

## 2021-08-25 ENCOUNTER — Other Ambulatory Visit: Payer: Self-pay | Admitting: General Practice

## 2021-09-24 ENCOUNTER — Other Ambulatory Visit: Payer: Self-pay | Admitting: Cardiovascular Disease

## 2021-10-01 ENCOUNTER — Other Ambulatory Visit: Payer: Self-pay | Admitting: Cardiovascular Disease

## 2021-10-14 ENCOUNTER — Telehealth: Payer: Self-pay | Admitting: Cardiovascular Disease

## 2021-10-14 NOTE — Telephone Encounter (Signed)
Returned call to patient home # listed no answer.Unable to leave a message voice mail full.Cell # listed does not work.Called patient's mother she will have patient call back.

## 2021-10-14 NOTE — Telephone Encounter (Signed)
Received a call back from patient.She stated she has been having chest pain off and on since this past Saturday.No chest pain at present.She notices pain at night.Advised no PA appointments this week.Keep appointment already scheduled with Edd Fabian NP 11/22 at 10:30 am.I will call back if we have any cancellations.Advised if she has any more chest pain go to ED.

## 2021-10-14 NOTE — Telephone Encounter (Signed)
Pt c/o of Chest Pain: STAT if CP now or developed within 24 hours  1. Are you having CP right now? Not at this time, chest discomfort, sometimes it feels like indigestion- had an episode earlier today  2. Are you experiencing any other symptoms (ex. SOB, nausea, vomiting, sweating)?    3. How long have you been experiencing CP? Saturday night(10-11-21)  4. Is your CP continuous or coming and going? comes and goes  5. Have you taken Nitroglycerin? No- pt wanted to be seen- I made  her an appointment for next Tuesday(10-21-21)  please call to evaluate ?

## 2021-10-20 NOTE — Progress Notes (Signed)
Cardiology Clinic Note   Patient Name: Sharon Munoz Date of Encounter: 10/21/2021  Primary Care Provider:  [name], MD Primary Cardiologist:  Nanetta Batty, MD  Patient Profile    Sharon Munoz 65 year old female presents to the clinic today for an evaluation of her chest discomfort.  Past Medical History    Past Medical History:  Diagnosis Date   Back spasm    Diabetes mellitus    Hypertension    Systolic ejection murmur 1986   Past Surgical History:  Procedure Laterality Date   CESAREAN SECTION     RIGHT/LEFT HEART CATH AND CORONARY ANGIOGRAPHY N/A 03/08/2020   Procedure: RIGHT/LEFT HEART CATH AND CORONARY ANGIOGRAPHY;  Surgeon: Yvonne Kendall, MD;  Location: [facility];  Service: Cardiovascular;  Laterality: N/A;    Allergies  Allergies  Allergen Reactions   Ace Inhibitors Itching and Swelling   Citrus Hives, Swelling and Other (See Comments)    Pt says she gets knots.    Codeine Nausea And Vomiting    Excessive vomiting   Lisinopril Hives   Other Itching, Swelling and Other (See Comments)    Any type of nut Pt will get mouth blisters   Percocet [Oxycodone-Acetaminophen]     Disoriented, difficulty waking up and hard to focus, vomiting   Propoxyphene Other (See Comments)    Darvocet (Propoxyphene-Acetaminophen).  Causes Disorientation, difficulty waking up, difficulty focusing, and vomiting.   Januvia [Sitagliptin] Itching and Palpitations    History of Present Illness    Sharon Munoz has a PMH of essential hypertension, chronic rhinitis, GERD, diabetes mellitus, dyslipidemia, hypokalemia, angioedema, H. pylori infection, palpitations, and Takotsubo cardiomyopathy. She was admitted to the hospital on 03/07/2020 with chest pain and discharged 2 days later.  Her cardiac enzymes elevated slightly.  Echocardiogram showed apical wall motion normalities and basal hypokinesis.  Cardiac catheterization showed normal coronary arteries felt  to be Takotsubo syndrome.  She was last seen by Dr. Allyson Sabal on 05/08/2020.  During that time she denied cardiac symptoms and had been asymptomatic since discharge.   She presented to the clinic 07/01/20 for follow-up evaluation and stated she felt well.  She continued to be very physically active at home doing cardiovascular type exercise.  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  She worked around 20 hours of  per week.  She stated she also cared for her mother who is elderly.  She indicated that she had some dietary indiscretion and eats out frequently.  She had been monitoring her blood pressure at home and stated that it typically ran in the 140s over the 80s.  I added 12.5 of hydrochlorothiazide to her medication regimen.  We ordered a BMP and had her follow-up in 1 month.   She presented to the clinic 08/06/20 for follow-up evaluation and stated she felt well.  She had begun exercising more.  She had much better control blood pressure.  Her blood pressure was 125/74.  She continued to have allergic reactions which she described as causing hives on her forearms and legs.  She indicated that she felt like it may be related to her evening medications.  However, she had taken the same medications through the day and did not have any sort of reactions.  We discussed alternate reasons for reaction such as food/environmental.  I had her continue to monitor.  I  ordered a BMP, refilled her atenolol, and planned her follow-up in 6 months.  She contacted the nurse triage line on 10/14/2021 and reported episodes of intermittent chest discomfort.  At the time of the call she was not having any pain.  She was instructed to keep her appointment.  She presents to the clinic today for follow-up evaluation states she was noticing intermittent periods of brief sharp chest discomfort at night when she would lay down.  She reduce the amount of grapes she was eating and noticed less chest  discomfort.  We talked about the difference between GERD and cardiac discomfort.  She expressed understanding.  I will provide her with the GERD diet information, refill her cardiac medications, have her continue to increase her physical activity as tolerated and plan follow-up with Dr. Gwenlyn Found in 6 months.  Today she denies chest pain, shortness of breath, lower extremity edema, fatigue, palpitations, melena, hematuria, hemoptysis, diaphoresis, weakness, presyncope, syncope, orthopnea, and PND.   Home Medications    Prior to Admission medications   Medication Sig Start Date End Date Taking? Authorizing Provider  amLODipine (NORVASC) 10 MG tablet TAKE 1 TABLET BY MOUTH EVERY DAY 06/09/21   Lorretta Harp, MD  aspirin EC 81 MG tablet Take 81 mg by mouth daily.    [provider]  atenolol (TENORMIN) 50 MG tablet TAKE 1 TABLET BY MOUTH TWICE A DAY 08/13/21   Deberah Pelton, NP  CALCIUM PO Take 1 capsule by mouth daily.    [provider]  Cholecalciferol (VITAMIN D3 PO) Take 1 capsule by mouth daily. [employer] Brand     [provider]  empagliflozin (JARDIANCE) 25 MG TABS tablet Take 25 mg by mouth daily.     [provider]  escitalopram (LEXAPRO) 20 MG tablet Take 1 tablet (20 mg total) by mouth daily. 03/09/20 03/09/21  Nita Sells, MD  gabapentin (NEURONTIN) 100 MG capsule Take 3 capsules (300 mg total) by mouth 3 (three) times daily. Patient taking differently: Take 100 mg by mouth daily.  02/12/16   Marcial Pacas, MD  hydrochlorothiazide (MICROZIDE) 12.5 MG capsule TAKE 1 CAPSULE BY MOUTH EVERY DAY 10/01/21   Lorretta Harp, MD  MAGNESIUM PO Take 1 tablet by mouth daily at 12 noon.    [provider]  Multiple Vitamins-Minerals (HAIR SKIN NAILS PO) Take 1 capsule by mouth daily.    [provider]  pravastatin (PRAVACHOL) 20 MG tablet Take 20 mg by mouth daily.    [provider]  TRESIBA FLEXTOUCH 100 UNIT/ML FlexTouch  Pen Inject 20 Units into the skin daily. 06/20/20   [provider]    Family History    Family History  Problem Relation Age of Onset   Asthma Father    Hypertension Father    Kidney cancer Father    CVA Father 77   Asthma Brother    Colon cancer Brother 63   Hypertension Mother    Eczema Neg Hx    Immunodeficiency Neg Hx    Urticaria Neg Hx    She indicated that the status of her mother is unknown. She indicated that her father is deceased. She indicated that her sister is alive. She indicated that her brother is deceased. She indicated that the status of her neg hx is unknown.  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  Tobacco Use   Smoking status: Never   Smokeless tobacco: Never  Substance and Sexual Activity   Alcohol use: No    Alcohol/week: 0.0 standard drinks   Drug use: Never   Sexual activity: Not Currently  Other Topics Concern   Not on file  Social History Narrative   Not on file   Social Determinants of Health   Financial Resource Strain: Not on file  Food Insecurity: Not on file  Transportation Needs: Not on file  Physical Activity: Not on file  Stress: Not on file  Social Connections: Not on file  Intimate Partner Violence: Not on file     Review of Systems    General:  No chills, fever, night sweats or weight changes.  Cardiovascular:  No chest pain, dyspnea on exertion, edema, orthopnea, palpitations, paroxysmal nocturnal dyspnea. Dermatological: No rash, lesions/masses Respiratory: No cough, dyspnea Urologic: No hematuria, dysuria Abdominal:   No nausea, vomiting, diarrhea, bright red blood per rectum, melena, or hematemesis Neurologic:  No visual changes,  wkns, changes in mental status. All other systems reviewed and are otherwise negative except as noted above.  Physical Exam    VS:  BP 136/82 (BP Location: Left Arm, Patient Position: Sitting, Cuff Size: Normal)   Pulse (!) 57   Ht 5\' 3"  (1.6 m)   Wt 147 lb (66.7 kg)   BMI 26.04 kg/m  , BMI Body mass index is 26.04 kg/m. GEN: Well nourished, well developed, in no acute distress. HEENT: normal. Neck: Supple, no JVD, carotid bruits, or masses. Cardiac: RRR, no murmurs, rubs, or gallops. No clubbing, cyanosis, edema.  Radials/DP/PT 2+ and equal bilaterally.  Respiratory:  Respirations regular and unlabored, clear to auscultation bilaterally. GI: Soft, nontender, nondistended, BS + x 4. MS: no deformity or atrophy. Skin: warm and dry, no rash. Neuro:  Strength and sensation are intact. Psych: Normal affect.  Accessory Clinical Findings    Recent Labs: No results found for requested labs within last 8760 hours.   Recent Lipid Panel    Component Value Date/Time   CHOL 143 03/08/2020 0815   TRIG 41 03/08/2020 0815   HDL 80 03/08/2020 0815   CHOLHDL 1.8 03/08/2020 0815   VLDL 8 03/08/2020 0815   LDLCALC 55 03/08/2020 0815   LDLDIRECT 119.6 10/04/2007 0919    ECG personally reviewed by me today-sinus bradycardia possible left atrial enlargement septal infarct undetermined age 76 bpm- No acute changes  EKG 08/06/2020 normal sinus rhythm possible left atrial enlargement nonspecific T wave abnormality 60 bpm   Echocardiogram 06/05/2020 IMPRESSIONS     1. Left ventricular ejection fraction, by estimation, is 60 to 65%. The  left ventricle has normal function. The left ventricle has no regional  wall motion abnormalities. There is mild asymmetric left ventricular  hypertrophy of the basal-septal segment.  Left ventricular diastolic parameters are indeterminate.   2. Right ventricular systolic function is normal. The right ventricular  size is normal.   3. The mitral valve is  normal in structure. Trivial mitral valve  regurgitation. No evidence of mitral stenosis.   4. The aortic valve is tricuspid. Aortic valve regurgitation is trivial.  Mild aortic valve sclerosis is present, with no evidence of aortic valve  stenosis.   5. The inferior vena cava is normal in size with greater than 50%  respiratory variability, suggesting right atrial pressure of 3 mmHg.  Conclusion(s)/Recommendation(s): Compared to prior study, EF has  normalized. There is no longer systolic anterior motion of the mitral  leaflet, and MR is improved.   Cardiac catheterization 03/08/2020   Tortuous coronary arteries without angiographically significant stenosis. Upper normal to mildly elevated left heart and pulmonary artery pressures. Normal right heart filling pressures. Normal cardiac output/index. Dynamic LVOT gradient of 25 mmHg at rest and 65-70 mmHg post-PVC.   Recommendations: Primary prevention of coronary artery disease. Hold amlodipine and increase metoprolol tartrate to 100 mg twice daily to improve rate control.  Assessment & Plan   1.  Chest pain/GERD- noticed with eating grapes and with laying down at night.  Appears to be reflux in nature.  Cardiac catheterization 03/08/2020 which showed normal coronary arteries.  Continue amlodipine, atenolol Heart healthy low-sodium diet-salty 6 given Increase physical activity as tolerated Provided GERD diet information  Dizziness- resolved.  EKG today shows sinus bradycardia possible left atrial enlargement septal infarct undetermined age 37 bpm.   Maintain p.o. hydration Saline nasal rinse Follow-up with PCP  Essential hypertension-BP today 136/82.    Well-controlled at home.   Continue HCTZ, amlodipine, atenolol Heart healthy low-sodium diet-salty 6 given Increase physical activity as tolerated Continue hydrochlorothiazide 12.5 Repeat BMP, refill amlodipine, atenolol, HCTZ   Hyperlipidemia-LDL 55 on 03/08/2020 Continue  pravastatin Heart healthy low-sodium diet-salty 6 given Increase physical activity as tolerated Follows with PCP   Disposition: Follow-up with Dr. Gwenlyn Found or me in 6 months.   Jossie Ng. Romaine Maciolek NP-C    10/21/2021, 11:14 AM [address] Office [phone] Fax [phone]  Notice: This dictation was prepared with Dragon dictation along with smaller phrase technology. Any transcriptional errors that result from this process are unintentional and may not be corrected upon review.  I spent 13 minutes examining this patient, reviewing medications, and using patient centered shared decision making involving her cardiac care.  Prior to her visit I spent greater than 20 minutes reviewing her past medical history,  medications, and prior cardiac tests.

## 2021-10-21 ENCOUNTER — Other Ambulatory Visit: Payer: Self-pay

## 2021-10-21 ENCOUNTER — Encounter: Payer: Self-pay | Admitting: General Practice

## 2021-10-21 ENCOUNTER — Ambulatory Visit (INDEPENDENT_AMBULATORY_CARE_PROVIDER_SITE_OTHER): Payer: 59 | Admitting: General Practice

## 2021-10-21 VITALS — BP 136/82 | HR 57 | Ht 63.0 in | Wt 147.0 lb

## 2021-10-21 DIAGNOSIS — R0789 Other chest pain: Secondary | ICD-10-CM

## 2021-10-21 DIAGNOSIS — E785 Hyperlipidemia, unspecified: Secondary | ICD-10-CM | POA: Diagnosis not present

## 2021-10-21 DIAGNOSIS — R42 Dizziness and giddiness: Secondary | ICD-10-CM | POA: Diagnosis not present

## 2021-10-21 DIAGNOSIS — I10 Essential (primary) hypertension: Secondary | ICD-10-CM | POA: Diagnosis not present

## 2021-10-21 MED ORDER — AMLODIPINE BESYLATE 10 MG PO TABS: 10.0000 mg | ORAL_TABLET | Freq: Every day | ORAL | 3 refills | Status: DC

## 2021-10-21 MED ORDER — EMPAGLIFLOZIN 25 MG PO TABS: 25.0000 mg | ORAL_TABLET | Freq: Every day | ORAL | 3 refills | Status: DC

## 2021-10-21 MED ORDER — HYDROCHLOROTHIAZIDE 12.5 MG PO CAPS: 12.5000 mg | ORAL_CAPSULE | Freq: Every day | ORAL | 3 refills | Status: DC

## 2021-10-21 MED ORDER — ATENOLOL 50 MG PO TABS: 50.0000 mg | ORAL_TABLET | Freq: Two times a day (BID) | ORAL | 3 refills | Status: AC

## 2021-10-21 MED ORDER — PRAVASTATIN SODIUM 20 MG PO TABS: 20.0000 mg | ORAL_TABLET | Freq: Every day | ORAL | 3 refills | Status: DC

## 2021-10-21 NOTE — Patient Instructions (Signed)
Medication Instructions:  Your Physician recommend you continue on your current medication as directed.     *If you need a refill on your cardiac medications before your next appointment, please call your pharmacy*   Lab Work: None    Testing/Procedures: None    Follow-Up: At [facility], you and your health needs are our priority.  As part of our continuing mission to provide you with exceptional heart care, we have created designated Provider Care Teams.  These Care Teams include your primary Cardiologist (physician) and Advanced Practice Providers (APPs -  Physician Assistants and Nurse Practitioners) who all work together to provide you with the care you need, when you need it.  We recommend signing up for the patient portal called "MyChart".  Sign up information is provided on this After Visit Summary.  MyChart is used to connect with patients for Virtual Visits (Telemedicine).  Patients are able to view lab/test results, encounter notes, upcoming appointments, etc.  Non-urgent messages can be sent to your provider as well.   To learn more about what you can do with MyChart, go to [email or web address].    Your next appointment:   6 month(s)  The format for your next appointment:   In Person  Provider:   Nanetta Batty, MD    Other Instructions  Parke Simmers Diet A bland diet consists of foods that are often soft and do not have a lot of fat, fiber, or extra seasonings. Foods without fat, fiber, or seasoning are easier for the body to digest. They are also less likely to irritate your mouth, throat, stomach, and other parts of your digestive system. A bland diet is sometimes called a BRAT diet. What is my plan? Your health care provider or food and nutrition specialist (dietitian) may recommend specific changes to your diet to prevent symptoms or to treat your symptoms. These changes may include: Eating small meals often. Cooking food until it is soft enough to chew  easily. Chewing your food well. Drinking fluids slowly. Not eating foods that are very spicy, sour, or fatty. Not eating citrus fruits, such as oranges and grapefruit. What do I need to know about this diet? Eat a variety of foods from the bland diet food list. Do not follow a bland diet longer than needed. Ask your health care provider whether you should take vitamins or supplements. What foods can I eat? Grains Hot cereals, such as cream of wheat. Rice. Bread, crackers, or tortillas made from refined white flour. Vegetables Canned or cooked vegetables. Mashed or boiled potatoes. Fruits Bananas. Applesauce. Other types of cooked or canned fruit with the skin and seeds removed, such as canned peaches or pears. Meats and other proteins Scrambled eggs. Creamy peanut butter or other nut butters. Lean, well-cooked meats, such as chicken or fish. Tofu. Soups or broths. Dairy Low-fat dairy products, such as milk, cottage cheese, or yogurt. Beverages Water. Herbal tea. Apple juice. Fats and oils Mild salad dressings. Canola or olive oil. Sweets and desserts Pudding. Custard. Fruit gelatin. Ice cream. The items listed above may not be a complete list of recommended foods and beverages. Contact a dietitian for more options. What foods are not recommended? Grains Whole grain breads and cereals. Vegetables Raw vegetables. Fruits Raw fruits, especially citrus, berries, or dried fruits. Dairy Whole fat dairy foods. Beverages Caffeinated drinks. Alcohol. Seasonings and condiments Strongly flavored seasonings or condiments. Hot sauce. Salsa. Other foods Spicy foods. Fried foods. Sour foods, such as pickled or fermented foods. Foods with high  sugar content. Foods high in fiber. The items listed above may not be a complete list of foods and beverages to avoid. Contact a dietitian for more information. Summary A bland diet consists of foods that are often soft and do not have a lot of fat,  fiber, or extra seasonings. Foods without fat, fiber, or seasoning are easier for the body to digest. Check with your health care provider to see how long you should follow this diet plan. It is not meant to be followed for long periods. This information is not intended to replace advice given to you by your health care provider. Make sure you discuss any questions you have with your health care provider. Document Revised: 12/15/2017 Document Reviewed: 12/15/2017 Elsevier Patient Education  2022 [employer].

## 2021-10-29 ENCOUNTER — Ambulatory Visit: Payer: BLUE CROSS/BLUE SHIELD | Admitting: Cardiovascular Disease

## 2022-03-18 ENCOUNTER — Emergency Department (HOSPITAL_COMMUNITY): Payer: Medicare Other

## 2022-03-18 ENCOUNTER — Other Ambulatory Visit: Payer: Self-pay

## 2022-03-18 ENCOUNTER — Encounter (HOSPITAL_COMMUNITY): Payer: Self-pay

## 2022-03-18 ENCOUNTER — Observation Stay (HOSPITAL_COMMUNITY)
Admission: EM | Admit: 2022-03-18 | Discharge: 2022-03-20 | Disposition: A | Discharge status: Home / Self Care | Payer: Medicare Other | Attending: Internal Medicine | Admitting: Internal Medicine

## 2022-03-18 DIAGNOSIS — Z7985 Long-term (current) use of injectable non-insulin antidiabetic drugs: Secondary | ICD-10-CM | POA: Insufficient documentation

## 2022-03-18 DIAGNOSIS — Z7984 Long term (current) use of oral hypoglycemic drugs: Secondary | ICD-10-CM | POA: Diagnosis not present

## 2022-03-18 DIAGNOSIS — E876 Hypokalemia: Secondary | ICD-10-CM | POA: Diagnosis not present

## 2022-03-18 DIAGNOSIS — Z79899 Other long term (current) drug therapy: Secondary | ICD-10-CM | POA: Diagnosis not present

## 2022-03-18 DIAGNOSIS — E119 Type 2 diabetes mellitus without complications: Secondary | ICD-10-CM

## 2022-03-18 DIAGNOSIS — R002 Palpitations: Secondary | ICD-10-CM | POA: Diagnosis present

## 2022-03-18 DIAGNOSIS — Z7982 Long term (current) use of aspirin: Secondary | ICD-10-CM | POA: Insufficient documentation

## 2022-03-18 DIAGNOSIS — R531 Weakness: Secondary | ICD-10-CM | POA: Diagnosis present

## 2022-03-18 DIAGNOSIS — R778 Other specified abnormalities of plasma proteins: Secondary | ICD-10-CM | POA: Insufficient documentation

## 2022-03-18 DIAGNOSIS — I10 Essential (primary) hypertension: Secondary | ICD-10-CM | POA: Diagnosis not present

## 2022-03-18 DIAGNOSIS — R7989 Other specified abnormal findings of blood chemistry: Secondary | ICD-10-CM

## 2022-03-18 LAB — BASIC METABOLIC PANEL
Anion gap: 11 (ref 5–15)
BUN: 11 mg/dL (ref 8–23)
CO2: 25 mmol/L (ref 22–32)
Calcium: 9.1 mg/dL (ref 8.9–10.3)
Chloride: 105 mmol/L (ref 98–111)
Creatinine, Ser: 0.48 mg/dL (ref 0.44–1.00)
GFR, Estimated: >60 mL/min (ref >60)
Glucose, Bld: 180 mg/dL — ABNORMAL HIGH (ref 70–99)
Potassium: 2.9 mmol/L — ABNORMAL LOW (ref 3.5–5.1)
Sodium: 141 mmol/L (ref 135–145)

## 2022-03-18 LAB — CBC
HCT: 42.7 % (ref 36.0–46.0)
Hemoglobin: 14.1 g/dL (ref 12.0–15.0)
MCH: 29.6 pg (ref 26.0–34.0)
MCHC: 33 g/dL (ref 30.0–36.0)
MCV: 89.7 fL (ref 80.0–100.0)
Platelets: 248 10*3/uL (ref 150–400)
RBC: 4.76 MIL/uL (ref 3.87–5.11)
RDW: 14.4 % (ref 11.5–15.5)
WBC: 7.5 10*3/uL (ref 4.0–10.5)
nRBC: 0 % (ref 0.0–0.2)

## 2022-03-18 LAB — TROPONIN I (HIGH SENSITIVITY): Troponin I (High Sensitivity): 8 ng/L (ref <18)

## 2022-03-18 LAB — D-DIMER, QUANTITATIVE: D-Dimer, Quant: 0.95 ug/mL-FEU — ABNORMAL HIGH (ref 0.00–0.50)

## 2022-03-18 MED ORDER — SODIUM CHLORIDE 0.9 % IV BOLUS
500.0000 mL | Freq: Once | INTRAVENOUS | Status: AC
  Administered 2022-03-18: 500 mL via INTRAVENOUS

## 2022-03-18 MED ORDER — ASPIRIN 81 MG PO CHEW
324.0000 mg | CHEWABLE_TABLET | Freq: Once | ORAL | Status: AC
  Administered 2022-03-18: 324 mg via ORAL
  Filled 2022-03-18: qty 4

## 2022-03-18 MED ORDER — METOPROLOL TARTRATE 5 MG/5ML IV SOLN
5.0000 mg | Freq: Once | INTRAVENOUS | Status: AC
  Administered 2022-03-18: 5 mg via INTRAVENOUS
  Filled 2022-03-18: qty 5

## 2022-03-18 NOTE — ED Provider Notes (Signed)
?[facility] EMERGENCY DEPARTMENT ?Provider Note ? ? ?CSN: [id number] ?Arrival date & time: 03/18/22  2147 ? ?  ? ?History ? ?Chief Complaint  ?Patient presents with  ? Palpitations  ? ? ?Sharon Munoz is a 66 y.o. female. ? ? ?Palpitations ? ?Patient has a history of diabetes, hypertension, systolic ejection murmur and history of Takotsubo's versus hypertrophic cardiomyopathy.  Patient states she was at home this evening taking care of her grandchildren.  She started to feel very diaphoretic and weak and noticed her heart was racing when she started to walk up the stairs.  Patient ended up sitting down and resting.  She waited until her daughter came home.  Patient took her blood pressure and it was elevated and she noticed that her heart rate remained tachycardic.  She denies having any trouble with chest pain.  She has not noticed any leg swelling.  She has not had any fevers chills.  Patient states the symptoms were similar to when she was admitted in the hospital in the past.  Records reviewed from that visit the patient ended up having an echocardiogram that showed wall motion abnormality and ejection fraction of 45 to 50%.  Her cardiac cath showed tortuous coronary arteries without evidence of obstruction.  She had normal cardiac output. ? ?Home Medications ?Prior to Admission medications   ?Medication Sig Start Date End Date Taking? Authorizing Provider  ?amLODipine (NORVASC) 10 MG tablet Take 1 tablet (10 mg total) by mouth daily. 10/21/21   Ronney Asters, NP  ?aspirin EC 81 MG tablet Take 81 mg by mouth daily.    [provider]  ?atenolol (TENORMIN) 50 MG tablet Take 1 tablet (50 mg total) by mouth 2 (two) times daily. 10/21/21   Ronney Asters, NP  ?CALCIUM PO Take 1 capsule by mouth daily.    [provider]  ?Cholecalciferol (VITAMIN D3 PO) Take 1 capsule by mouth daily. [employer] Brand     [provider]  ?empagliflozin (JARDIANCE) 25 MG TABS tablet  Take 1 tablet (25 mg total) by mouth daily. 10/21/21   Ronney Asters, NP  ?escitalopram (LEXAPRO) 20 MG tablet Take 1 tablet (20 mg total) by mouth daily. 03/09/20 03/09/21  Rhetta Mura, MD  ?gabapentin (NEURONTIN) 100 MG capsule Take 3 capsules (300 mg total) by mouth 3 (three) times daily. ?Patient taking differently: Take 100 mg by mouth daily. 02/12/16   Levert Feinstein, MD  ?hydrochlorothiazide (MICROZIDE) 12.5 MG capsule Take 1 capsule (12.5 mg total) by mouth daily. 10/21/21   Ronney Asters, NP  ?MAGNESIUM PO Take 1 tablet by mouth daily at 12 noon.    [provider]  ?Multiple Vitamins-Minerals (HAIR SKIN NAILS PO) Take 1 capsule by mouth daily.    [provider]  ?pravastatin (PRAVACHOL) 20 MG tablet Take 1 tablet (20 mg total) by mouth daily. 10/21/21   Ronney Asters, NP  ?TRESIBA FLEXTOUCH 100 UNIT/ML FlexTouch Pen Inject 20 Units into the skin daily. 06/20/20   [provider]  ?   ? ?Allergies    ?Ace inhibitors, Citrus, Codeine, Lisinopril, Other, Percocet [oxycodone-acetaminophen], Propoxyphene, and Januvia [sitagliptin]   ? ?Review of Systems   ?Review of Systems  ?Constitutional:  Negative for fever.  ?Cardiovascular:  Positive for palpitations.  ? ?Physical Exam ?Updated Vital Signs ?BP (!) 146/80   Pulse (!) 105   Temp 98.4 ?F (36.9 ?C) (Oral)   Resp 13   SpO2 97%  ?Physical Exam ?Vitals  and nursing note reviewed.  ?Constitutional:   ?   General: She is not in acute distress. ?   Appearance: She is well-developed.  ?HENT:  ?   Head: Normocephalic and atraumatic.  ?   Right Ear: External ear normal.  ?   Left Ear: External ear normal.  ?Eyes:  ?   General: No scleral icterus.    ?   Right eye: No discharge.     ?   Left eye: No discharge.  ?   Conjunctiva/sclera: Conjunctivae normal.  ?Neck:  ?   Trachea: No tracheal deviation.  ?Cardiovascular:  ?   Rate and Rhythm: Regular rhythm. Tachycardia present.  ?   Heart sounds: Murmur heard.  ?Systolic murmur is  present.  ?Pulmonary:  ?   Effort: Pulmonary effort is normal. No respiratory distress.  ?   Breath sounds: Normal breath sounds. No stridor. No wheezing or rales.  ?Abdominal:  ?   General: Bowel sounds are normal. There is no distension.  ?   Palpations: Abdomen is soft.  ?   Tenderness: There is no abdominal tenderness. There is no guarding or rebound.  ?Musculoskeletal:     ?   General: No tenderness or deformity.  ?   Cervical back: Neck supple.  ?Skin: ?   General: Skin is warm and dry.  ?   Findings: No rash.  ?Neurological:  ?   General: No focal deficit present.  ?   Mental Status: She is alert.  ?   Cranial Nerves: No cranial nerve deficit (no facial droop, extraocular movements intact, no slurred speech).  ?   Sensory: No sensory deficit.  ?   Motor: No abnormal muscle tone or seizure activity.  ?   Coordination: Coordination normal.  ?Psychiatric:     ?   Mood and Affect: Mood normal.  ? ? ?ED Results / Procedures / Treatments   ?Labs ?(all labs ordered are listed, but only abnormal results are displayed) ?Labs Reviewed - No data to display ? ?EKG ?EKG Interpretation ? ?Date/Time:  Wednesday March 18 2022 21:50:14 EDT ?Ventricular Rate:  115 ?PR Interval:  179 ?QRS Duration: 84 ?QT Interval:  345 ?QTC Calculation: 478 ?R Axis:   -19 ?Text Interpretation: Sinus tachycardia Biatrial enlargement Anterior infarct, old Since last tracing rate faster Confirmed by Linwood DibblesKnapp, Abdulkadir Emmanuel [phone]([id number]) on 03/18/2022 9:58:39 PM ? ?Radiology ?No results found. ? ?Procedures ?Procedures  ? ? ?Medications Ordered in ED ?Medications  ?aspirin chewable tablet 324 mg (has no administration in time range)  ?sodium chloride 0.9 % bolus 500 mL (has no administration in time range)  ?metoprolol tartrate (LOPRESSOR) injection 5 mg (has no administration in time range)  ? ? ?ED Course/ Medical Decision Making/ A&P ?Clinical Course as of 03/18/22 2334  ?Wed Mar 18, 2022  ?2305 Heart rate improved down into the 90s at the bedside [JK]  ?2334  D-dimer slightly increased.  We will proceed with CT angio [JK]  ?  ?Clinical Course User Index ?[JK] Linwood DibblesKnapp, Grafton Warzecha, MD  ? ?                        ?Medical Decision Making ?Amount and/or Complexity of Data Reviewed ?Labs: ordered. ?Radiology: ordered. ? ?Risk ?OTC drugs. ?Prescription drug management. ?Decision regarding hospitalization. ? ? ?Pt presented with complaints of malaise, weakness, tachycardia.  Hx of takotsubo's. Initially tachycardic in the ED.  DDx NSTEMI, PE, cardiomyopathy.  Sx improved with metoprolol.   D dimer  elevated, plan on CT to evaluate for PE.  Metabolic panel , trop pending at the time of shift change.  Care turned over to Dr Preston Fleeting.  ? ? ? ? ? ? ? ?Final Clinical Impression(s) / ED Diagnoses ?pending ?  ?Linwood Dibbles, MD ?03/19/22 1509 ? ?

## 2022-03-18 NOTE — ED Provider Notes (Signed)
Care assumed from Dr. Lynelle Doctor, patient with weakness, shortness of breath, palpitations pending labs. History of Takotsubo's cardiomyopathy. D-dimer is elevated, CTA chest is pending. ? ?CT angiogram shows no evidence of pulmonary embolism, pneumonia.  I have independently viewed the images, and agree with the radiologist's interpretation.  Incidental finding of thyroid nodule which does not need any further work-up.  Repeat troponin has increased slightly.  This is most likely demand ischemia in the setting of episode of tachycardia.  She will need to be admitted for further monitoring.  Probably will need some form of extended cardiac monitoring to make sure she did not have paroxysmal atrial fibrillation.  Case is discussed with Dr. Antionette Char of [facility], who agrees to admit the patient. ? ?Results for orders placed or performed during the hospital encounter of 03/18/22  ?Basic metabolic panel  ?Result Value Ref Range  ? Sodium 141 135 - 145 mmol/L  ? Potassium 2.9 (L) 3.5 - 5.1 mmol/L  ? Chloride 105 98 - 111 mmol/L  ? CO2 25 22 - 32 mmol/L  ? Glucose, Bld 180 (H) 70 - 99 mg/dL  ? BUN 11 8 - 23 mg/dL  ? Creatinine, Ser 0.48 0.44 - 1.00 mg/dL  ? Calcium 9.1 8.9 - 10.3 mg/dL  ? GFR, Estimated >60 >60 mL/min  ? Anion gap 11 5 - 15  ?CBC  ?Result Value Ref Range  ? WBC 7.5 4.0 - 10.5 K/uL  ? RBC 4.76 3.87 - 5.11 MIL/uL  ? Hemoglobin 14.1 12.0 - 15.0 g/dL  ? HCT 42.7 36.0 - 46.0 %  ? MCV 89.7 80.0 - 100.0 fL  ? MCH 29.6 26.0 - 34.0 pg  ? MCHC 33.0 30.0 - 36.0 g/dL  ? RDW 14.4 11.5 - 15.5 %  ? Platelets 248 150 - 400 K/uL  ? nRBC 0.0 0.0 - 0.2 %  ?D-dimer, quantitative  ?Result Value Ref Range  ? D-Dimer, Quant 0.95 (H) 0.00 - 0.50 ug/mL-FEU  ?Magnesium  ?Result Value Ref Range  ? Magnesium 1.8 1.7 - 2.4 mg/dL  ?CBG monitoring, ED  ?Result Value Ref Range  ? Glucose-Capillary 123 (H) 70 - 99 mg/dL  ?Troponin I (High Sensitivity)  ?Result Value Ref Range  ? Troponin I (High Sensitivity) 8 <18 ng/L  ?Troponin I (High  Sensitivity)  ?Result Value Ref Range  ? Troponin I (High Sensitivity) 30 (H) <18 ng/L  ? ?CT Angio Chest PE W and/or Wo Contrast ? ?Result Date: 03/19/2022 ?CLINICAL DATA:  Pulmonary embolism suspected.  Positive D-dimer. EXAM: CT ANGIOGRAPHY CHEST WITH CONTRAST TECHNIQUE: Multidetector CT imaging of the chest was performed using the standard protocol during bolus administration of intravenous contrast. Multiplanar CT image reconstructions and MIPs were obtained to evaluate the vascular anatomy. RADIATION DOSE REDUCTION: This exam was performed according to the departmental dose-optimization program which includes automated exposure control, adjustment of the mA and/or kV according to patient size and/or use of iterative reconstruction technique. CONTRAST:  69mL OMNIPAQUE IOHEXOL 350 MG/ML SOLN COMPARISON:  Portable chest yesterday, PA Lat 03/07/2020, CTA chest 03/08/2020. FINDINGS: Cardiovascular: The pulmonary arteries are normal caliber without evidence of thromboemboli. There is mild aortic atherosclerosis and tortuosity without aneurysm, dissection or penetrating ulcer. There is mild panchamber cardiomegaly without venous dilatation, pericardial effusion or visible coronary artery calcification. The great vessels are tortuous but patent. Mediastinum/Nodes: There is a 1.4 cm slightly heterogeneous nodule in the lower pole left lobe of the thyroid gland. Unclear if this was present on the prior study because  the area was largely obscured by contrast artifacts. No imaging follow-up is recommended There is no intrathoracic or axillary adenopathy. There is contrast in the distal thoracic esophagus and the hiatal hernia in the herniated portion of stomach. There is a small hiatal hernia. The trachea is clear. Lungs/Pleura: Posterior basal atelectatic changes are similar to previous exam. There is increasing bronchial thickening in the lower lobes however, no pulmonary infiltrate or mass is seen and no pleural  effusion, thickening or pneumothorax. There is mild elevation of the right hemidiaphragm. No visible emphysematous or fibrotic change. Upper Abdomen: No acute findings. 2.1 cm cyst upper pole right kidney of 16.8 Hounsfield units. Musculoskeletal: Mild thoracic kyphodextroscoliosis and osteopenia. No chest wall abnormality. No acute or further significant osseous findings. Review of the MIP images confirms the above findings. IMPRESSION: 1. No evidence of arterial embolic filling defects. 2. Cardiomegaly without findings of acute CHF. 3. Aortic atherosclerosis, tortuosity. 4. Small hiatal hernia with contrast in the distal esophagus and hiatal hernia. 5. Increased findings of lower lobe bronchitis but no findings of acute bronchopneumonia or aspiration. 6. 1.4 cm nodule left lobe of the thyroid gland. No imaging follow-up recommended. Electronically Signed   By: Telford Nab M.D.   On: 03/19/2022 04:28  ? ?DG Chest Portable 1 View ? ?Result Date: 03/18/2022 ?CLINICAL DATA:  Chest pain. EXAM: PORTABLE CHEST 1 VIEW COMPARISON:  Chest x-ray 03/07/2020.  Filled FINDINGS: The heart size and mediastinal contours are within normal limits. Both lungs are clear. The visualized skeletal structures are unremarkable. IMPRESSION: No active disease. Electronically Signed   By: Ronney Asters M.D.   On: 03/18/2022 23:59   ? ? ?  ?Delora Fuel, MD ?[id number] ? ?

## 2022-03-18 NOTE — ED Triage Notes (Signed)
Pt was at home and began feeling heart palpitations and chest tightness. Pt with history of MI and stated this felt the same. When ems arrived HR was 170s. Pt received nitro with some chest tighness relief. ?

## 2022-03-19 ENCOUNTER — Observation Stay (HOSPITAL_BASED_OUTPATIENT_CLINIC_OR_DEPARTMENT_OTHER): Payer: Medicare Other

## 2022-03-19 ENCOUNTER — Encounter (HOSPITAL_COMMUNITY): Payer: Self-pay | Admitting: Family Medicine

## 2022-03-19 ENCOUNTER — Emergency Department (HOSPITAL_COMMUNITY): Payer: Medicare Other

## 2022-03-19 DIAGNOSIS — R002 Palpitations: Secondary | ICD-10-CM | POA: Diagnosis not present

## 2022-03-19 DIAGNOSIS — E876 Hypokalemia: Secondary | ICD-10-CM

## 2022-03-19 DIAGNOSIS — E0800 Diabetes mellitus due to underlying condition with hyperosmolarity without nonketotic hyperglycemic-hyperosmolar coma (NKHHC): Secondary | ICD-10-CM

## 2022-03-19 DIAGNOSIS — I10 Essential (primary) hypertension: Secondary | ICD-10-CM

## 2022-03-19 DIAGNOSIS — R531 Weakness: Secondary | ICD-10-CM | POA: Diagnosis not present

## 2022-03-19 DIAGNOSIS — I351 Nonrheumatic aortic (valve) insufficiency: Secondary | ICD-10-CM | POA: Diagnosis not present

## 2022-03-19 DIAGNOSIS — Z794 Long term (current) use of insulin: Secondary | ICD-10-CM

## 2022-03-19 DIAGNOSIS — R778 Other specified abnormalities of plasma proteins: Secondary | ICD-10-CM

## 2022-03-19 LAB — ECHOCARDIOGRAM COMPLETE
AR max vel: 2.39 cm2
AV Area VTI: 2.35 cm2
AV Area mean vel: 2.45 cm2
AV Mean grad: 4 mmHg
AV Peak grad: 6.9 mmHg
Ao pk vel: 1.31 m/s
Area-P 1/2: 2.46 cm2
Calc EF: 71.1 %
MV VTI: 2.54 cm2
P 1/2 time: 509 msec
S' Lateral: 2.09 cm
Single Plane A2C EF: 70.6 %
Single Plane A4C EF: 71.7 %

## 2022-03-19 LAB — BASIC METABOLIC PANEL
Anion gap: 6 (ref 5–15)
BUN: 6 mg/dL — ABNORMAL LOW (ref 8–23)
CO2: 25 mmol/L (ref 22–32)
Calcium: 8.3 mg/dL — ABNORMAL LOW (ref 8.9–10.3)
Chloride: 110 mmol/L (ref 98–111)
Creatinine, Ser: 0.36 mg/dL — ABNORMAL LOW (ref 0.44–1.00)
GFR, Estimated: >60 mL/min (ref >60)
Glucose, Bld: 112 mg/dL — ABNORMAL HIGH (ref 70–99)
Potassium: 3.5 mmol/L (ref 3.5–5.1)
Sodium: 141 mmol/L (ref 135–145)

## 2022-03-19 LAB — HIV ANTIBODY (ROUTINE TESTING W REFLEX): HIV Screen 4th Generation wRfx: NONREACTIVE

## 2022-03-19 LAB — CBG MONITORING, ED
Glucose-Capillary: 123 mg/dL — ABNORMAL HIGH (ref 70–99)
Glucose-Capillary: 128 mg/dL — ABNORMAL HIGH (ref 70–99)
Glucose-Capillary: 113 mg/dL — ABNORMAL HIGH (ref 70–99)
Glucose-Capillary: 160 mg/dL — ABNORMAL HIGH (ref 70–99)

## 2022-03-19 LAB — TROPONIN I (HIGH SENSITIVITY)
Troponin I (High Sensitivity): 30 ng/L — ABNORMAL HIGH (ref <18)
Troponin I (High Sensitivity): 62 ng/L — ABNORMAL HIGH (ref <18)

## 2022-03-19 LAB — TSH: TSH: 1.718 u[IU]/mL (ref 0.350–4.500)

## 2022-03-19 LAB — MAGNESIUM: Magnesium: 1.8 mg/dL (ref 1.7–2.4)

## 2022-03-19 MED ORDER — PRAVASTATIN SODIUM 10 MG PO TABS
20.0000 mg | ORAL_TABLET | Freq: Every day | ORAL | Status: DC
  Administered 2022-03-19 – 2022-03-20 (×2): 20 mg via ORAL
  Filled 2022-03-19 (×2): qty 2

## 2022-03-19 MED ORDER — SODIUM CHLORIDE 0.9% FLUSH
3.0000 mL | Freq: Two times a day (BID) | INTRAVENOUS | Status: DC
  Administered 2022-03-19: 3 mL via INTRAVENOUS

## 2022-03-19 MED ORDER — POTASSIUM CHLORIDE CRYS ER 20 MEQ PO TBCR
40.0000 meq | EXTENDED_RELEASE_TABLET | Freq: Once | ORAL | Status: AC
Start: 2022-03-19 — End: 2022-03-19
  Administered 2022-03-19: 40 meq via ORAL
  Filled 2022-03-19: qty 2

## 2022-03-19 MED ORDER — SODIUM CHLORIDE 0.9 % IV SOLN: 250.0000 mL | INTRAVENOUS | Status: DC | PRN

## 2022-03-19 MED ORDER — IOHEXOL 350 MG/ML SOLN
100.0000 mL | Freq: Once | INTRAVENOUS | Status: AC | PRN
  Administered 2022-03-19: 80 mL via INTRAVENOUS

## 2022-03-19 MED ORDER — INSULIN ASPART 100 UNIT/ML IJ SOLN: 0.0000 [IU] | Freq: Every day | INTRAMUSCULAR | Status: DC

## 2022-03-19 MED ORDER — ASPIRIN EC 81 MG PO TBEC
81.0000 mg | DELAYED_RELEASE_TABLET | Freq: Every day | ORAL | Status: DC
  Administered 2022-03-19 – 2022-03-20 (×2): 81 mg via ORAL
  Filled 2022-03-19 (×2): qty 1

## 2022-03-19 MED ORDER — INSULIN ASPART 100 UNIT/ML IJ SOLN: 0.0000 [IU] | INTRAMUSCULAR | Status: DC

## 2022-03-19 MED ORDER — MAGNESIUM SULFATE IN D5W 1-5 GM/100ML-% IV SOLN
1.0000 g | Freq: Once | INTRAVENOUS | Status: AC
  Administered 2022-03-19: 1 g via INTRAVENOUS
  Filled 2022-03-19: qty 100

## 2022-03-19 MED ORDER — ACETAMINOPHEN 650 MG RE SUPP: 650.0000 mg | Freq: Four times a day (QID) | RECTAL | Status: DC | PRN

## 2022-03-19 MED ORDER — ATENOLOL 25 MG PO TABS
50.0000 mg | ORAL_TABLET | Freq: Two times a day (BID) | ORAL | Status: DC
  Administered 2022-03-19 – 2022-03-20 (×3): 50 mg via ORAL
  Filled 2022-03-19: qty 2
  Filled 2022-03-19: qty 1
  Filled 2022-03-19: qty 2

## 2022-03-19 MED ORDER — ENOXAPARIN SODIUM 40 MG/0.4ML IJ SOSY
40.0000 mg | PREFILLED_SYRINGE | Freq: Every day | INTRAMUSCULAR | Status: DC
  Administered 2022-03-19 – 2022-03-20 (×2): 40 mg via SUBCUTANEOUS
  Filled 2022-03-19 (×2): qty 0.4

## 2022-03-19 MED ORDER — SODIUM CHLORIDE 0.9% FLUSH
3.0000 mL | Freq: Two times a day (BID) | INTRAVENOUS | Status: DC
  Administered 2022-03-19 – 2022-03-20 (×2): 3 mL via INTRAVENOUS

## 2022-03-19 MED ORDER — ACETAMINOPHEN 325 MG PO TABS
650.0000 mg | ORAL_TABLET | Freq: Four times a day (QID) | ORAL | Status: DC | PRN
  Administered 2022-03-19 – 2022-03-20 (×2): 650 mg via ORAL
  Filled 2022-03-19 (×2): qty 2

## 2022-03-19 MED ORDER — SODIUM CHLORIDE 0.9% FLUSH: 3.0000 mL | INTRAVENOUS | Status: DC | PRN

## 2022-03-19 MED ORDER — AMLODIPINE BESYLATE 10 MG PO TABS
10.0000 mg | ORAL_TABLET | Freq: Every day | ORAL | Status: DC
  Administered 2022-03-19 – 2022-03-20 (×2): 10 mg via ORAL
  Filled 2022-03-19: qty 2
  Filled 2022-03-19: qty 1

## 2022-03-19 MED ORDER — INSULIN ASPART 100 UNIT/ML IJ SOLN
0.0000 [IU] | Freq: Three times a day (TID) | INTRAMUSCULAR | Status: DC
  Administered 2022-03-19: 1 [IU] via SUBCUTANEOUS
  Administered 2022-03-19 – 2022-03-20 (×2): 2 [IU] via SUBCUTANEOUS
  Administered 2022-03-20: 3 [IU] via SUBCUTANEOUS

## 2022-03-19 MED ORDER — POTASSIUM CHLORIDE 10 MEQ/100ML IV SOLN
10.0000 meq | INTRAVENOUS | Status: AC
  Administered 2022-03-19 (×3): 10 meq via INTRAVENOUS
  Filled 2022-03-19 (×3): qty 100

## 2022-03-19 NOTE — ED Notes (Signed)
Patient transported to CT 

## 2022-03-19 NOTE — Progress Notes (Signed)
Brief note: ?-Admitted earlier today. ?-As per H&P done earlier today: "Sharon Munoz is a very pleasant 66 y.o. female with medical history significant for hypertension, type 2 diabetes mellitus, and suspected Takotsubo cardiomyopathy in April 2021, now presenting to the emergency department with rapid palpitations and chest tightness.  Patient reports that she was watching her grandchildren yesterday, was outside in the heat, had not eaten anything, and then went up some stairs quickly to see why her grandchildren were crying.  After going up the stairs, she developed rapid palpitations with chest tightness and some sweating.  She had mild shortness of breath associated with this.  Symptoms persisted and she called EMS.  She was reported to have heart rate in the 170s with EMS, was treated with nitroglycerin and aspirin, and reports improvement in her symptoms prior to arrival in the ED.  She denies any leg swelling or cough.  She reports that the symptoms were similar to what she experienced in April 2021 when she was suspected to have Takotsubo cardiomyopathy. ?  ?ED Course: Upon arrival to the ED, patient is found to be afebrile, saturating well on room air, and tachycardic in the 110s with stable blood pressure.  EKG features sinus tachycardia 315.  Chest x-ray negative for acute cardiopulmonary disease.  CTA chest negative for PE but notable for cardiomegaly without acute CHF and left lower lobe bronchial thickening without pneumonia or aspiration.  Chemistry panel features a potassium of 2.9 and glucose 180.  Troponin was 8 and then 30.  D-dimer was 0.95.  Patient was treated with 500 cc of saline, oral and IV potassium, and IV Lopressor in the ED". ? ?-Patient has remained stable.  Heart rate is currently controlled.  We will consult cardiology team.  Echocardiogram done. ?-Patient will be discharged once cleared by cardiology team. ?

## 2022-03-19 NOTE — H&P (Signed)
?History and Physical  ? ? ?Sharon Munoz EPP:[id number] DOB: 1956-03-02 DOA: 03/18/2022 ? ?PCP: [name], MD  ? ?Patient coming from: Home  ? ?Chief Complaint: Rapid palpitations, chest tightness  ? ?HPI: Sharon Munoz is a very pleasant 66 y.o. female with medical history significant for hypertension, type 2 diabetes mellitus, and suspected Takotsubo cardiomyopathy in April 2021, now presenting to the emergency department with rapid palpitations and chest tightness.  Patient reports that she was watching her grandchildren yesterday, was outside in the heat, had not eaten anything, and then went up some stairs quickly to see why her grandchildren were crying.  After going up the stairs, she developed rapid palpitations with chest tightness and some sweating.  She had mild shortness of breath associated with this.  Symptoms persisted and she called EMS.  She was reported to have heart rate in the 170s with EMS, was treated with nitroglycerin and aspirin, and reports improvement in her symptoms prior to arrival in the ED.  She denies any leg swelling or cough.  She reports that the symptoms were similar to what she experienced in April 2021 when she was suspected to have Takotsubo cardiomyopathy. ? ?ED Course: Upon arrival to the ED, patient is found to be afebrile, saturating well on room air, and tachycardic in the 110s with stable blood pressure.  EKG features sinus tachycardia 315.  Chest x-ray negative for acute cardiopulmonary disease.  CTA chest negative for PE but notable for cardiomegaly without acute CHF and left lower lobe bronchial thickening without pneumonia or aspiration.  Chemistry panel features a potassium of 2.9 and glucose 180.  Troponin was 8 and then 30.  D-dimer was 0.95.  Patient was treated with 500 cc of saline, oral and IV potassium, and IV Lopressor in the ED. ? ?Review of Systems:  ?All other systems reviewed and apart from HPI, are negative. ? ?Past Medical History:   ?Diagnosis Date  ? Back spasm   ? Diabetes mellitus   ? Hypertension   ? Systolic ejection murmur 1986  ? ? ?Past Surgical History:  ?Procedure Laterality Date  ? CESAREAN SECTION    ? RIGHT/LEFT HEART CATH AND CORONARY ANGIOGRAPHY N/A 03/08/2020  ? Procedure: RIGHT/LEFT HEART CATH AND CORONARY ANGIOGRAPHY;  Surgeon: Yvonne Kendall, MD;  Location: [facility];  Service: Cardiovascular;  Laterality: N/A;  ? ? ?Social History:  ? reports that she has never smoked. She has never used smokeless tobacco. She reports that she does not drink alcohol and does not use drugs. ? ?Allergies  ?Allergen Reactions  ? Ace Inhibitors Itching and Swelling  ? Citrus Hives, Swelling and Other (See Comments)  ?  Pt says she gets knots.   ? Codeine Nausea And Vomiting  ?  Excessive vomiting  ? Lisinopril Hives  ? Other Itching, Swelling and Other (See Comments)  ?  Any type of nut ?Pt will get mouth blisters  ? Percocet [Oxycodone-Acetaminophen]   ?  Disoriented, difficulty waking up and hard to focus, vomiting  ? Propoxyphene Other (See Comments)  ?  Darvocet (Propoxyphene-Acetaminophen).  Causes Disorientation, difficulty waking up, difficulty focusing, and vomiting.  ? Januvia [Sitagliptin] Itching and Palpitations  ? ? ?Family History  ?Problem Relation Age of Onset  ? Asthma Father   ? Hypertension Father   ? Kidney cancer Father   ? CVA Father 7  ? Asthma Brother   ? Colon cancer Brother 40  ? Hypertension Mother   ? Eczema Neg  Hx   ? Immunodeficiency Neg Hx   ? Urticaria Neg Hx   ? ? ? ?Prior to Admission medications   ?Medication Sig Start Date End Date Taking? Authorizing Provider  ?amLODipine (NORVASC) 10 MG tablet Take 1 tablet (10 mg total) by mouth daily. 10/21/21   Ronney Astersleaver, Jesse M, NP  ?aspirin EC 81 MG tablet Take 81 mg by mouth daily.    [provider]  ?atenolol (TENORMIN) 50 MG tablet Take 1 tablet (50 mg total) by mouth 2 (two) times daily. 10/21/21   Ronney Astersleaver, Jesse M, NP  ?CALCIUM PO Take 1 capsule  by mouth daily.    [provider]  ?Cholecalciferol (VITAMIN D3 PO) Take 1 capsule by mouth daily. [employer] Brand     [provider]  ?empagliflozin (JARDIANCE) 25 MG TABS tablet Take 1 tablet (25 mg total) by mouth daily. 10/21/21   Ronney Astersleaver, Jesse M, NP  ?escitalopram (LEXAPRO) 20 MG tablet Take 1 tablet (20 mg total) by mouth daily. 03/09/20 03/09/21  Rhetta MuraSamtani, Jai-Gurmukh, MD  ?gabapentin (NEURONTIN) 100 MG capsule Take 3 capsules (300 mg total) by mouth 3 (three) times daily. ?Patient taking differently: Take 100 mg by mouth daily. 02/12/16   Levert FeinsteinYan, Yijun, MD  ?hydrochlorothiazide (MICROZIDE) 12.5 MG capsule Take 1 capsule (12.5 mg total) by mouth daily. 10/21/21   Ronney Astersleaver, Jesse M, NP  ?MAGNESIUM PO Take 1 tablet by mouth daily at 12 noon.    [provider]  ?Multiple Vitamins-Minerals (HAIR SKIN NAILS PO) Take 1 capsule by mouth daily.    [provider]  ?pravastatin (PRAVACHOL) 20 MG tablet Take 1 tablet (20 mg total) by mouth daily. 10/21/21   Ronney Astersleaver, Jesse M, NP  ?TRESIBA FLEXTOUCH 100 UNIT/ML FlexTouch Pen Inject 20 Units into the skin daily. 06/20/20   [provider]  ? ? ?Physical Exam: ?Vitals:  ? 03/19/22 0300 03/19/22 0415 03/19/22 0445 03/19/22 0451  ?BP: (!) 141/75   137/79  ?Pulse: 79 70 71 71  ?Resp: 16 16 (!) 22   ?Temp:    98.8 ?F (37.1 ?C)  ?TempSrc:      ?SpO2: 99% 98% 97% 97%  ? ? ?Constitutional: NAD, calm  ?Eyes: PERTLA, lids and conjunctivae normal ?ENMT: Mucous membranes are moist. Posterior pharynx clear of any exudate or lesions.   ?Neck: supple, no masses  ?Respiratory: no wheezing, no crackles. No accessory muscle use.  ?Cardiovascular: S1 & S2 heard, regular rate and rhythm. No extremity edema.   ?Abdomen: No distension, no tenderness, soft. Bowel sounds active.  ?Musculoskeletal: no clubbing / cyanosis. No joint deformity upper and lower extremities.   ?Skin: no significant rashes, lesions, ulcers. Warm, dry, well-perfused. ?Neurologic:  CN 2-12 grossly intact. Moving all extremities. Alert and oriented.  ?Psychiatric: Very pleasant. Cooperative.  ? ? ?Labs and Imaging on Admission: I have personally reviewed following labs and imaging studies ? ?CBC: ?Recent Labs  ?Lab 03/18/22 ?2235  ?WBC 7.5  ?HGB 14.1  ?HCT 42.7  ?MCV 89.7  ?PLT 248  ? ?Basic Metabolic Panel: ?Recent Labs  ?Lab 03/18/22 ?2235 03/19/22 ?0040  ?NA 141  --   ?K 2.9*  --   ?CL 105  --   ?CO2 25  --   ?GLUCOSE 180*  --   ?BUN 11  --   ?CREATININE 0.48  --   ?CALCIUM 9.1  --   ?MG  --  1.8  ? ?GFR: ?CrCl cannot be calculated (Unknown ideal weight.). ?Liver Function Tests: ?No results for input(s):  AST, ALT, ALKPHOS, BILITOT, PROT, ALBUMIN in the last 168 hours. ?No results for input(s): LIPASE, AMYLASE in the last 168 hours. ?No results for input(s): AMMONIA in the last 168 hours. ?Coagulation Profile: ?No results for input(s): INR, PROTIME in the last 168 hours. ?Cardiac Enzymes: ?No results for input(s): CKTOTAL, CKMB, CKMBINDEX, TROPONINI in the last 168 hours. ?BNP (last 3 results) ?No results for input(s): PROBNP in the last 8760 hours. ?HbA1C: ?No results for input(s): HGBA1C in the last 72 hours. ?CBG: ?Recent Labs  ?Lab 03/19/22 ?0110  ?GLUCAP 123*  ? ?Lipid Profile: ?No results for input(s): CHOL, HDL, LDLCALC, TRIG, CHOLHDL, LDLDIRECT in the last 72 hours. ?Thyroid Function Tests: ?No results for input(s): TSH, T4TOTAL, FREET4, T3FREE, THYROIDAB in the last 72 hours. ?Anemia Panel: ?No results for input(s): VITAMINB12, FOLATE, FERRITIN, TIBC, IRON, RETICCTPCT in the last 72 hours. ?Urine analysis: ?   ?Component Value Date/Time  ? COLORURINE lt. yellow 02/24/2011 0918  ? APPEARANCEUR Clear 01/28/2010 0903  ? LABSPEC 1.010 02/24/2011 0918  ? PHURINE 7.0 02/24/2011 0918  ? GLUCOSEU NEGATIVE 05/23/2007 1512  ? HGBUR negative 02/24/2011 0918  ? BILIRUBINUR negative 02/24/2011 0918  ? KETONESUR NEGATIVE 05/23/2007 1512  ? UROBILINOGEN 0.2 02/24/2011 0918  ? NITRITE negative  02/24/2011 0918  ? LEUKOCYTESUR Trace (A) 05/23/2007 1512  ? ?Sepsis Labs: ?@LABRCNTIP (procalcitonin:4,lacticidven:4) ?)No results found for this or any previous visit (from the past 240 hour(s)).  ? ?[facility]

## 2022-03-20 DIAGNOSIS — R002 Palpitations: Secondary | ICD-10-CM | POA: Diagnosis not present

## 2022-03-20 DIAGNOSIS — R531 Weakness: Secondary | ICD-10-CM | POA: Diagnosis not present

## 2022-03-20 LAB — BASIC METABOLIC PANEL
Anion gap: 7 (ref 5–15)
BUN: 18 mg/dL (ref 8–23)
CO2: 22 mmol/L (ref 22–32)
Calcium: 8.8 mg/dL — ABNORMAL LOW (ref 8.9–10.3)
Chloride: 109 mmol/L (ref 98–111)
Creatinine, Ser: 0.51 mg/dL (ref 0.44–1.00)
GFR, Estimated: >60 mL/min (ref >60)
Glucose, Bld: 176 mg/dL — ABNORMAL HIGH (ref 70–99)
Potassium: 3.2 mmol/L — ABNORMAL LOW (ref 3.5–5.1)
Sodium: 138 mmol/L (ref 135–145)

## 2022-03-20 LAB — CBC
HCT: 41.7 % (ref 36.0–46.0)
Hemoglobin: 13.8 g/dL (ref 12.0–15.0)
MCH: 29.4 pg (ref 26.0–34.0)
MCHC: 33.1 g/dL (ref 30.0–36.0)
MCV: 88.9 fL (ref 80.0–100.0)
Platelets: 231 10*3/uL (ref 150–400)
RBC: 4.69 MIL/uL (ref 3.87–5.11)
RDW: 14.3 % (ref 11.5–15.5)
WBC: 6.6 10*3/uL (ref 4.0–10.5)
nRBC: 0 % (ref 0.0–0.2)

## 2022-03-20 LAB — GLUCOSE, CAPILLARY
Glucose-Capillary: 178 mg/dL — ABNORMAL HIGH (ref 70–99)
Glucose-Capillary: 170 mg/dL — ABNORMAL HIGH (ref 70–99)
Glucose-Capillary: 206 mg/dL — ABNORMAL HIGH (ref 70–99)
Glucose-Capillary: 81 mg/dL (ref 70–99)

## 2022-03-20 LAB — HEMOGLOBIN A1C
Hgb A1c MFr Bld: 7.6 % — ABNORMAL HIGH (ref 4.8–5.6)
Mean Plasma Glucose: 171.42 mg/dL

## 2022-03-20 LAB — MAGNESIUM: Magnesium: 2 mg/dL (ref 1.7–2.4)

## 2022-03-20 MED ORDER — POTASSIUM CHLORIDE CRYS ER 20 MEQ PO TBCR
40.0000 meq | EXTENDED_RELEASE_TABLET | Freq: Once | ORAL | Status: AC
Start: 2022-03-20 — End: 2022-03-20
  Administered 2022-03-20: 40 meq via ORAL
  Filled 2022-03-20: qty 2

## 2022-03-20 NOTE — Care Management Obs Status (Signed)
MEDICARE OBSERVATION STATUS NOTIFICATION ? ? ?Patient Details  ?Name: Sharon Munoz ?MRN: [id number] ?Date of Birth: 1956/01/26 ? ? ?Medicare Observation Status Notification Given:  Yes ? ? ? ?Bethena Roys, RN ?03/20/2022, 3:31 PM ?

## 2022-03-20 NOTE — Discharge Summary (Signed)
?Physician Discharge Summary ?  ?Patient: Sharon Munoz MRN: [id number] DOB: 08-20-1956  ?Admit date:     03/18/2022  ?Discharge date: 03/20/22  ?Discharge Physician: Barnetta Chapel  ? ?PCP: Tisovec, Adelfa Koh, MD  ? ?Recommendations at discharge:  ?Follow-up with primary care provider and cardiology team within 1 week of discharge. ? ?Discharge Diagnoses: ?Principal Problem: ?  Palpitations ?Active Problems: ?  Diabetes mellitus (HCC) ?  HYPOKALEMIA ?  Essential hypertension ?  Elevated troponin ? ?Resolved Problems: ?  * No resolved hospital problems. * ? ?Hospital Course: ?Patient is a 66 year old African-American female with past medical history significant for hypertension, type 2 diabetes mellitus, and suspected Takotsubo cardiomyopathy in April 2021.  Patient was admitted with rapid palpitations and chest tightness.  Patient reports that she was watching her grandchildren yesterday, was outside in the heat, had not eaten anything, and then went up some stairs quickly to see why her grandchildren were crying.  After going up the stairs, she developed rapid palpitations with chest tightness and some sweating.  She had mild shortness of breath associated with this.  Symptoms persisted and she called EMS.  She was reported to have heart rate in the 170s with EMS, was treated with nitroglycerin and aspirin, and reports improvement in her symptoms prior to arrival in the ED.  She denies any leg swelling or cough.  She reports that the symptoms were similar to what she experienced in April 2021 when she was suspected to have Takotsubo cardiomyopathy. ?  ?ED Course: Upon arrival to the ED, patient is found to be afebrile, saturating well on room air, and tachycardic in the 110s with stable blood pressure.  EKG features sinus tachycardia 315.  Chest x-ray negative for acute cardiopulmonary disease.  CTA chest negative for PE but notable for cardiomegaly without acute CHF and left lower lobe bronchial thickening  without pneumonia or aspiration.  Chemistry panel features a potassium of 2.9 and glucose 180.  Troponin was 8 and then 30.  D-dimer was 0.95.  Patient was treated with 500 cc of saline, oral and IV potassium, and IV Lopressor in the ED. ? ?Patient was admitted for further assessment and management.  No further palpitation noted.  Patient has remained stable.  Patient be discharged back home to the care of the cardiology team. ? ?Assessment and Plan: ?1. Palpitations; elevated troponin  ?- Presents with rapid palpitations and chest tightness after walking up stairs, had HR 170s with EMS (appeared to be ST with frequent PVCs)  ?- She reports improvement with NTG and ASA prior to arrival in ED where initial troponin was 8, then increased to 30  ?- She reports sxs similar to April 2021 when she had tortuous coronaries without significant stenosis on cath and was suspected to have Takotsubo CM  ?- Plan to repeat troponin, continue cardiac monitoring, check echocardiogram, continue beta-blocker, and continue ASA and statin   ?-No further palpitation noted during the hospital stay. ?  ?2. Hypokalemia  ?- Serum potassium 2.9 on admission, replaced in ED  ?- Repeat chem panel   ?Potassium improved to 3.2.  Potassium was replaced prior to discharge.  Continue to monitor renal function and electrolytes. ?  ?3. Type II DM  ?- A1c was 8.7% in April 2021  ?-Hemoglobin A1c done on 03/20/2022 was 7.6%. ?  ?4. Hypertension  ?- Continue amlodipine and atenolol  ? ? ?Consultants: None ?Procedures performed: None ?Disposition: Home ?Diet recommendation:  ?Discharge Diet Orders (From admission, onward)  ? ?  Start     Ordered  ? 03/20/22 0000  Diet - low sodium heart healthy       ? 03/20/22 1723  ? ?  ?  ? ?  ? ?Cardiac diet ?DISCHARGE MEDICATION: ?Allergies as of 03/20/2022   ? ?   Reactions  ? Ace Inhibitors Itching, Swelling  ? Citrus Hives, Swelling, Other (See Comments)  ? Pt says she gets knots.   ? Codeine Nausea And Vomiting  ?  Excessive vomiting  ? Lisinopril Hives  ? Other Itching, Swelling, Other (See Comments)  ? Any type of nut ?Pt will get mouth blisters  ? Percocet [oxycodone-acetaminophen]   ? Disoriented, difficulty waking up and hard to focus, vomiting  ? Propoxyphene Other (See Comments)  ? Darvocet (Propoxyphene-Acetaminophen).  Causes Disorientation, difficulty waking up, difficulty focusing, and vomiting.  ? Januvia [sitagliptin] Itching, Palpitations  ? ?  ? ?  ?Medication List  ?  ? ?TAKE these medications   ? ?acetaminophen 500 MG tablet ?Commonly known as: TYLENOL ?Take 1,000 mg by mouth every 6 (six) hours as needed for mild pain. ?  ?amLODipine 10 MG tablet ?Commonly known as: NORVASC ?Take 1 tablet (10 mg total) by mouth daily. ?  ?aspirin EC 81 MG tablet ?Take 81 mg by mouth daily. ?  ?atenolol 50 MG tablet ?Commonly known as: TENORMIN ?Take 1 tablet (50 mg total) by mouth 2 (two) times daily. ?  ?CALCIUM PO ?Take 1 capsule by mouth 2 (two) times a week. ?  ?empagliflozin 25 MG Tabs tablet ?Commonly known as: Jardiance ?Take 1 tablet (25 mg total) by mouth daily. ?  ?escitalopram 20 MG tablet ?Commonly known as: Lexapro ?Take 1 tablet (20 mg total) by mouth daily. ?  ?Fish Oil 1000 MG Caps ?Take 1,000 mg by mouth 2 (two) times a week. ?  ?gabapentin 100 MG capsule ?Commonly known as: NEURONTIN ?Take 3 capsules (300 mg total) by mouth 3 (three) times daily. ?What changed:  ?how much to take ?when to take this ?  ?HAIR SKIN NAILS PO ?Take 1 capsule by mouth 3 (three) times a week. ?  ?hydrochlorothiazide 12.5 MG capsule ?Commonly known as: MICROZIDE ?Take 1 capsule (12.5 mg total) by mouth daily. ?  ?Klor-Con M10 10 MEQ tablet ?Generic drug: potassium chloride ?Take 10 mEq by mouth 3 (three) times daily. For 30 days ?  ?MAGNESIUM PO ?Take 250 mg by mouth 3 (three) times a week. ?  ?pravastatin 20 MG tablet ?Commonly known as: PRAVACHOL ?Take 1 tablet (20 mg total) by mouth daily. ?  ?Evaristo Buryresiba FlexTouch 100 UNIT/ML  FlexTouch Pen ?Generic drug: insulin degludec ?Inject 36 Units into the skin daily. ?  ?VITAMIN D3 PO ?Take 1 capsule by mouth 3 (three) times a week. [employer] Brand ?  ? ?  ? ? ?Discharge Exam: ?Filed Weights  ? 03/20/22 [id number]  ?Weight: 63.9 kg  ? ? ? ?Condition at discharge: stable ? ?The results of significant diagnostics from this hospitalization (including imaging, microbiology, ancillary and laboratory) are listed below for reference.  ? ?Imaging Studies: ?CT Angio Chest PE W and/or Wo Contrast ? ?Result Date: 03/19/2022 ?CLINICAL DATA:  Pulmonary embolism suspected.  Positive D-dimer. EXAM: CT ANGIOGRAPHY CHEST WITH CONTRAST TECHNIQUE: Multidetector CT imaging of the chest was performed using the standard protocol during bolus administration of intravenous contrast. Multiplanar CT image reconstructions and MIPs were obtained to evaluate the vascular anatomy. RADIATION DOSE REDUCTION: This exam was performed according to the departmental dose-optimization program which includes  automated exposure control, adjustment of the mA and/or kV according to patient size and/or use of iterative reconstruction technique. CONTRAST:  51mL OMNIPAQUE IOHEXOL 350 MG/ML SOLN COMPARISON:  Portable chest yesterday, PA Lat 03/07/2020, CTA chest 03/08/2020. FINDINGS: Cardiovascular: The pulmonary arteries are normal caliber without evidence of thromboemboli. There is mild aortic atherosclerosis and tortuosity without aneurysm, dissection or penetrating ulcer. There is mild panchamber cardiomegaly without venous dilatation, pericardial effusion or visible coronary artery calcification. The great vessels are tortuous but patent. Mediastinum/Nodes: There is a 1.4 cm slightly heterogeneous nodule in the lower pole left lobe of the thyroid gland. Unclear if this was present on the prior study because the area was largely obscured by contrast artifacts. No imaging follow-up is recommended There is no intrathoracic or axillary  adenopathy. There is contrast in the distal thoracic esophagus and the hiatal hernia in the herniated portion of stomach. There is a small hiatal hernia. The trachea is clear. Lungs/Pleura: Posterior basal atelectatic

## 2022-04-07 ENCOUNTER — Encounter: Payer: Self-pay | Admitting: Emergency Medicine

## 2022-04-07 ENCOUNTER — Ambulatory Visit
Admission: EM | Admit: 2022-04-07 | Discharge: 2022-04-07 | Disposition: A | Discharge status: Home / Self Care | Payer: Medicare Other

## 2022-04-07 DIAGNOSIS — M654 Radial styloid tenosynovitis [de Quervain]: Secondary | ICD-10-CM

## 2022-04-07 DIAGNOSIS — M65331 Trigger finger, right middle finger: Secondary | ICD-10-CM | POA: Diagnosis not present

## 2022-04-07 DIAGNOSIS — M720 Palmar fascial fibromatosis [Dupuytren]: Secondary | ICD-10-CM | POA: Diagnosis not present

## 2022-04-07 MED ORDER — CELECOXIB 200 MG PO CAPS: 200.0000 mg | ORAL_CAPSULE | Freq: Two times a day (BID) | ORAL | 0 refills | Status: DC

## 2022-04-07 NOTE — ED Triage Notes (Signed)
Pt is present today with right hand pain. Pt states that the pain radiates up her right arm. Pt stats that she noticed the pain one month ago.  ?

## 2022-04-07 NOTE — ED Provider Notes (Signed)
?[name] ? ? ? ?CSN: [id number] ?Arrival date & time: 04/07/22  1433 ? ? ?  ? ?History   ?Chief Complaint ?Chief Complaint  ?Patient presents with  ? Hand Pain  ? Arm Pain  ? ? ?HPI ?Sharon Munoz is a 66 y.o. female.  ? ?Pleasant 66 year old female presents today due to concerns of right hand pain.  She states its been bothering her for the past month.  It has been getting progressively worse, now to the point where it is radiating up her forearm.  She takes gabapentin daily but states it does not help pain.  She also has been trying over-the-counter Tylenol with no relief.  She reports any movement of her thumb or wrist causes the pain in her forearm.  She also states she can feel a palpable knot in the palm of her hand which causes her middle finger to bend.  She states it is impossible to fully extend her middle finger and feels like as she does, it snaps. ? ? ?Hand Pain ? ?Arm Pain ? ? ?Past Medical History:  ?Diagnosis Date  ? Back spasm   ? Diabetes mellitus   ? Hypertension   ? Systolic ejection murmur 1986  ? ? ?Patient Active Problem List  ? Diagnosis Date Noted  ? Cardiomyopathy (HCC) 03/20/2020  ? Chest pain 03/08/2020  ? Back muscle spasm 03/08/2020  ? Elevated troponin   ? Palpitations 05/10/2018  ? Bilateral hand pain 02/12/2016  ? H. pylori infection 01/29/2016  ? Chronic urticaria 12/10/2015  ? Angioedema 12/10/2015  ? Chronic rhinitis 12/10/2015  ? WRIST PAIN, RIGHT 03/05/2009  ? HYPOMAGNESEMIA 10/05/2008  ? CATARACTS, BILATERAL 08/24/2008  ? Cardiac murmur 08/24/2008  ? CARPAL TUNNEL SYNDROME, BILATERAL 08/23/2008  ? Dyslipidemia 10/20/2007  ? HYPOKALEMIA 10/20/2007  ? OTHER ACUTE REACTIONS TO STRESS 10/04/2007  ? GERD 06/07/2007  ? POSTMENOPAUSAL STATUS 11/30/2006  ? Diabetes mellitus (HCC) 11/30/2005  ? Essential hypertension 11/30/2002  ? ? ?Past Surgical History:  ?Procedure Laterality Date  ? CESAREAN SECTION    ? RIGHT/LEFT HEART CATH AND CORONARY ANGIOGRAPHY N/A 03/08/2020  ?  Procedure: RIGHT/LEFT HEART CATH AND CORONARY ANGIOGRAPHY;  Surgeon: Yvonne Kendall, MD;  Location: [facility];  Service: Cardiovascular;  Laterality: N/A;  ? ? ?OB History   ?No obstetric history on file. ?  ? ? ? ?Home Medications   ? ?Prior to Admission medications   ?Medication Sig Start Date End Date Taking? Authorizing Provider  ?celecoxib (CELEBREX) 200 MG capsule Take 1 capsule (200 mg total) by mouth 2 (two) times daily. 04/07/22  Yes Anselm Aumiller L, PA  ?POTASSIUM PO Take by mouth.   Yes [provider]  ?acetaminophen (TYLENOL) 500 MG tablet Take 1,000 mg by mouth every 6 (six) hours as needed for mild pain.    [provider]  ?amLODipine (NORVASC) 10 MG tablet Take 1 tablet (10 mg total) by mouth daily. 10/21/21   Ronney Asters, NP  ?aspirin EC 81 MG tablet Take 81 mg by mouth daily.    [provider]  ?atenolol (TENORMIN) 50 MG tablet Take 1 tablet (50 mg total) by mouth 2 (two) times daily. 10/21/21   Ronney Asters, NP  ?CALCIUM PO Take 1 capsule by mouth 2 (two) times a week.    [provider]  ?Cholecalciferol (VITAMIN D3 PO) Take 1 capsule by mouth 3 (three) times a week. [employer] Brand    [provider]  ?empagliflozin (JARDIANCE)  25 MG TABS tablet Take 1 tablet (25 mg total) by mouth daily. 10/21/21   Ronney Astersleaver, Jesse M, NP  ?escitalopram (LEXAPRO) 20 MG tablet Take 1 tablet (20 mg total) by mouth daily. 03/09/20 03/19/22  Rhetta MuraSamtani, Jai-Gurmukh, MD  ?gabapentin (NEURONTIN) 100 MG capsule Take 3 capsules (300 mg total) by mouth 3 (three) times daily. ?Patient taking differently: Take 100 mg by mouth 2 (two) times daily. 02/12/16   Levert FeinsteinYan, Yijun, MD  ?hydrochlorothiazide (MICROZIDE) 12.5 MG capsule Take 1 capsule (12.5 mg total) by mouth daily. 10/21/21   Ronney Astersleaver, Jesse M, NP  ?KLOR-CON M10 10 MEQ tablet Take 10 mEq by mouth 3 (three) times daily. For 30 days 02/11/22   [provider]  ?MAGNESIUM PO Take 250 mg by mouth 3 (three)  times a week.    [provider]  ?Multiple Vitamins-Minerals (HAIR SKIN NAILS PO) Take 1 capsule by mouth 3 (three) times a week.    [provider]  ?Omega-3 Fatty Acids (FISH OIL) 1000 MG CAPS Take 1,000 mg by mouth 2 (two) times a week.    [provider]  ?pravastatin (PRAVACHOL) 20 MG tablet Take 1 tablet (20 mg total) by mouth daily. 10/21/21   Ronney Astersleaver, Jesse M, NP  ?TRESIBA FLEXTOUCH 100 UNIT/ML FlexTouch Pen Inject 36 Units into the skin daily. 06/20/20   [provider]  ? ? ?Family History ?Family History  ?Problem Relation Age of Onset  ? Asthma Father   ? Hypertension Father   ? Kidney cancer Father   ? CVA Father [id number]  ? Asthma Brother   ? Colon cancer Brother [phone]  ? Hypertension Mother   ? Eczema Neg Hx   ? Immunodeficiency Neg Hx   ? Urticaria Neg Hx   ? ? ?Social History ?Social History  ? ?Tobacco Use  ? Smoking status: Never  ? Smokeless tobacco: Never  ?Substance Use Topics  ? Alcohol use: No  ?  Alcohol/week: 0.0 standard drinks  ? Drug use: Never  ? ? ? ?Allergies   ?Ace inhibitors, Citrus, Codeine, Lisinopril, Other, Percocet [oxycodone-acetaminophen], Propoxyphene, and Januvia [sitagliptin] ? ? ?Review of Systems ?Review of Systems  ?Musculoskeletal:  Positive for arthralgias and myalgias.  ? ? ?Physical Exam ?Triage Vital Signs ?ED Triage Vitals  ?Enc Vitals Group  ?   BP 04/07/22 1552 (!) 148/76  ?   Pulse Rate 04/07/22 1552 72  ?   Resp 04/07/22 1552 18  ?   Temp 04/07/22 1552 98.4 ?F (36.9 ?C)  ?   Temp src --   ?   SpO2 04/07/22 1552 94 %  ?   Weight --   ?   Height --   ?   Head Circumference --   ?   Peak Flow --   ?   Pain Score 04/07/22 1551 8  ?   Pain Loc --   ?   Pain Edu? --   ?   Excl. in GC? --   ? ?No data found. ? ?Updated Vital Signs ?BP (!) 148/76   Pulse 72   Temp 98.4 ?F (36.9 ?C)   Resp 18   SpO2 94%  ? ?Visual Acuity ?Right Eye Distance:   ?Left Eye Distance:   ?Bilateral Distance:   ? ?Right Eye Near:   ?Left Eye Near:    ?Bilateral  Near:    ? ?Physical Exam ?Vitals and nursing note reviewed.  ?Constitutional:   ?   Appearance: Normal appearance. She is normal weight.  ?  Cardiovascular:  ?   Rate and Rhythm: Normal rate.  ?Pulmonary:  ?   Effort: Pulmonary effort is normal. No respiratory distress.  ?Musculoskeletal:  ?   Right shoulder: Normal.  ?   Left shoulder: Normal.  ?   Right upper arm: Normal.  ?   Left upper arm: Normal.  ?   Right elbow: Normal.  ?   Left elbow: Normal.  ?   Right wrist: Deformity (3rd digit in chronic contracture) and tenderness (to radial styloid, to knot on 3rd palmar tendon) present. No swelling, effusion, lacerations, snuff box tenderness or crepitus. Decreased range of motion (unable to fully extend 3rd digit on R). Normal pulse.  ?   Left wrist: Normal.  ?   Right hand: Deformity and tenderness present. Decreased range of motion. Normal pulse.  ?   Left hand: Normal.  ?   Cervical back: No rigidity.  ?   Comments: Positive finkelsteins  ?Neurological:  ?   Mental Status: She is alert.  ? ? ? ?UC Treatments / Results  ?Labs ?(all labs ordered are listed, but only abnormal results are displayed) ?Labs Reviewed - No data to display ? ?EKG ? ? ?Radiology ?No results found. ? ?Procedures ?Procedures (including critical care time) ? ?Medications Ordered in UC ?Medications - No data to display ? ?Initial Impression / Assessment and Plan / UC Course  ?I have reviewed the triage vital signs and the nursing notes. ? ?Pertinent labs & imaging results that were available during my care of the patient were reviewed by me and considered in my medical decision making (see chart for details). ? ?  ? ?Radial styloid tenosynovitis - thumb spica wrist brace applied in office. Will start celebrex, stop tylenol. Will refer to ortho for further management ?Trigger finger - must follow up with ortho. Will likely need specialized bracing ?Duputryn contracture -defer to ortho ? ?Final Clinical Impressions(s) / UC Diagnoses  ? ?Final  diagnoses:  ?Radial styloid tenosynovitis (de quervain)  ?Trigger middle finger of right hand  ?Dupuytren contracture  ? ? ? ?Discharge Instructions   ? ?  ?You have a radial styloid tenosynovitis, also calle

## 2022-04-07 NOTE — Discharge Instructions (Addendum)
You have a radial styloid tenosynovitis, also called de Quervain's tenosynovitis.  This is inflammation of the radial sheath around your thumb.  Please start taking Celebrex twice daily with food.  Please wear the thumb spica wrist brace until you see Ortho. ?You also have a trigger finger and Dupuytren's contracture to your middle finger.  This is best treated by an orthopedist.  Please call to schedule an appointment. ?

## 2022-04-15 ENCOUNTER — Other Ambulatory Visit: Payer: Self-pay | Admitting: Internal Medicine

## 2022-04-15 DIAGNOSIS — Z1231 Encounter for screening mammogram for malignant neoplasm of breast: Secondary | ICD-10-CM

## 2022-04-23 ENCOUNTER — Ambulatory Visit (INDEPENDENT_AMBULATORY_CARE_PROVIDER_SITE_OTHER): Payer: Medicare Other | Admitting: Cardiovascular Disease

## 2022-04-23 ENCOUNTER — Encounter: Payer: Self-pay | Admitting: Cardiovascular Disease

## 2022-04-23 DIAGNOSIS — I422 Other hypertrophic cardiomyopathy: Secondary | ICD-10-CM | POA: Diagnosis not present

## 2022-04-23 DIAGNOSIS — I10 Essential (primary) hypertension: Secondary | ICD-10-CM

## 2022-04-23 DIAGNOSIS — E785 Hyperlipidemia, unspecified: Secondary | ICD-10-CM

## 2022-04-23 NOTE — Assessment & Plan Note (Signed)
2D echocardiogram performed 03/19/2022 revealed severe asymmetric septal hypertrophy with normal LV systolic function.  She is essentially asymptomatic.  There is no family history of sudden cardiac death.  I am going to refer her to Dr. Kenn File  for further evaluation.

## 2022-04-23 NOTE — Assessment & Plan Note (Signed)
History of Takotsubo cardiomyopathy at the time of cath/9/21.  She had normal coronary arteries.  Most recent echo performed/20/23 revealed an increase in her EF from 40 to 45% up to 65 to 70%.  She did have asymmetric septal hypertrophy which was fairly severe.

## 2022-04-23 NOTE — Progress Notes (Signed)
04/23/2022 Sharon Munoz   02/23/1956  [id number]  Primary Physician Tisovec, Adelfa Kohichard W, MD Primary Cardiologist: Runell GessJonathan J Aaryana Betke MD Nicholes CalamityFACP, FACC, FAHA, [place]  HPI:  Sharon Munoz Munoz is a 66 y.o.  moderately overweight divorced American female mother of 1, grandmother of 2 grandchildren referred by Dr. Nehemiah SettlePolite for evaluation of palpitations.  I last saw her in the office 05/08/2020. She does have a history of treated hypertension and diabetes.  She is a former patient of Dr. Truett PernaGamble's.  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  She is never had a heart attack or stroke.  She denies chest pain or shortness of breath.  Her PCP apparently began her on a diabetes medication which resulted in palpitations.  This has since been discontinued and her palpitations have resolved spontaneously.  She does have a soft outflow tract murmur.   She was admitted to the hospital chest pain 03/07/2020 and discharged 2 days later.  Her enzymes rose minimally.  2D echo showed apical wall motion and normalities with basal hypokinesia and a dynamic outflow tract gradient evaluated by Dr. Eden EmmsNishan.  Cardiac cath was performed by Dr. Okey DupreEnd revealing normal coronary arteries.  The possibility of Takotsubo syndrome was raised as well as HOCM.  She has been asymptomatic since discharge.  She was recently admitted to the hospital with tachypalpitations of unclear etiology.  She has been doing well since I last saw her however.  She denies chest pain or shortness of breath.  She did have a 2D echo performed/20/23 that showed normal LV systolic function with severe asymmetric septal hypertrophy.     Current Meds  Medication Sig   acetaminophen (TYLENOL) 500 MG tablet Take 1,000 mg by mouth every 6 (six) hours as needed for mild pain.   amLODipine (NORVASC) 10 MG tablet Take 1 tablet (10 mg total) by mouth daily.   aspirin EC 81 MG tablet Take 81 mg by mouth daily.   atenolol (TENORMIN) 50 MG tablet Take 1 tablet (50 mg  total) by mouth 2 (two) times daily.   CALCIUM PO Take 1 capsule by mouth 2 (two) times a week.   celecoxib (CELEBREX) 200 MG capsule Take 1 capsule (200 mg total) by mouth 2 (two) times daily.   Cholecalciferol (VITAMIN D3 PO) Take 1 capsule by mouth 3 (three) times a week. [employer] Brand   empagliflozin (JARDIANCE) 25 MG TABS tablet Take 1 tablet (25 mg total) by mouth daily.   escitalopram (LEXAPRO) 20 MG tablet Take 1 tablet (20 mg total) by mouth daily.   gabapentin (NEURONTIN) 100 MG capsule Take 3 capsules (300 mg total) by mouth 3 (three) times daily. (Patient taking differently: Take 100 mg by mouth 2 (two) times daily.)   hydrochlorothiazide (MICROZIDE) 12.5 MG capsule Take 1 capsule (12.5 mg total) by mouth daily.   KLOR-CON M10 10 MEQ tablet Take 10 mEq by mouth 3 (three) times daily. For 30 days   MAGNESIUM PO Take 250 mg by mouth 3 (three) times a week.   Multiple Vitamins-Minerals (HAIR SKIN NAILS PO) Take 1 capsule by mouth 3 (three) times a week.   Omega-3 Fatty Acids (FISH OIL) 1000 MG CAPS Take 1,000 mg by mouth 2 (two) times a week.   POTASSIUM PO Take by mouth.   pravastatin (PRAVACHOL) 20 MG tablet Take 1 tablet (20 mg total) by mouth daily.   TRESIBA FLEXTOUCH 100 UNIT/ML FlexTouch Pen Inject 36 Units into the skin daily.  Allergies  Allergen Reactions   Ace Inhibitors Itching and Swelling   Citrus Hives, Swelling and Other (See Comments)    Pt says she gets knots.    Codeine Nausea And Vomiting    Excessive vomiting   Lisinopril Hives   Other Itching, Swelling and Other (See Comments)    Any type of nut Pt will get mouth blisters   Percocet [Oxycodone-Acetaminophen]     Disoriented, difficulty waking up and hard to focus, vomiting   Propoxyphene Other (See Comments)    Darvocet (Propoxyphene-Acetaminophen).  Causes Disorientation, difficulty waking up, difficulty focusing, and vomiting.   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  Tobacco Use   Smoking status: Never   Smokeless tobacco: Never  Substance and Sexual Activity   Alcohol use: No    Alcohol/week: 0.0 standard drinks   Drug use: Never   Sexual activity: Not Currently  Other Topics Concern   Not on file  Social History Narrative   Not on file   Social Determinants of Health   Financial Resource Strain: Not on file  Food Insecurity: Not on file  Transportation Needs: Not on file  Physical Activity: Not on file  Stress: Not on file  Social Connections: Not on file  Intimate Partner Violence: Not on file     Review of Systems: General: negative for chills, fever, night sweats or weight changes.  Cardiovascular: negative for chest pain, dyspnea on exertion, edema, orthopnea, palpitations, paroxysmal nocturnal dyspnea or shortness of breath Dermatological: negative for rash Respiratory: negative for cough or wheezing Urologic: negative for hematuria Abdominal: negative for nausea, vomiting, diarrhea, bright red blood per rectum, melena, or hematemesis Neurologic: negative for visual changes, syncope, or dizziness All other systems reviewed and are otherwise negative except as noted above.    Blood pressure (!) 156/84, pulse 64, height 5\' 3"  (1.6 m), weight 154 lb 9.6 oz (70.1 kg), SpO2 98 %.  General appearance: alert and no distress Neck: no adenopathy, no carotid bruit, no JVD, supple, symmetrical, trachea midline, and thyroid not enlarged, symmetric, no tenderness/mass/nodules Lungs: clear to auscultation bilaterally Heart: regular rate and rhythm, S1, S2 normal, no murmur, click, rub or gallop Extremities: extremities normal, atraumatic, no cyanosis or  edema Pulses: 2+ and symmetric Skin: Skin color, texture, turgor normal. No rashes or lesions Neurologic: Grossly normal  EKG sinus rhythm at 64 with borderline voltage for LVH and septal Q waves.  I personally reviewed this EKG.  ASSESSMENT AND PLAN:   Dyslipidemia History of hyperlipidemia on statin therapy with lipid profile performed 03/31/2022 revealing total cholesterol 146, LDL 73 and HDL 63.  Essential hypertension History of essential hypertension blood pressure measured today at 156/84.  She is on amlodipine, and atenolol.  She is not taking the hydrochlorothiazide which she was on previously.  She says that she checks her blood pressure at home and it runs in the 120/70 range normally.  Cardiomyopathy [facility]) History of Takotsubo cardiomyopathy at the time of cath/9/21.  She had normal coronary arteries.  Most recent echo performed/20/23 revealed an increase in her EF from 40 to 45% up to 65 to 70%.  She did have asymmetric septal hypertrophy which was fairly severe.  Asymmetric septal hypertrophy (HCC) 2D echocardiogram performed 03/19/2022 revealed severe asymmetric septal hypertrophy with normal LV systolic function.  She is essentially asymptomatic.  There is no family history of sudden cardiac death.  I am going to refer her to Dr. Kenn File  for further evaluation.     Runell Gess MD FACP,FACC,FAHA, [facility] 04/23/2022 9:59 AM

## 2022-04-23 NOTE — Patient Instructions (Addendum)
Medication Instructions:  Your physician recommends that you continue on your current medications as directed. Please refer to the Current Medication list given to you today.  *If you need a refill on your cardiac medications before your next appointment, please call your pharmacy*   Testing/Procedures: Your physician has requested that you have an echocardiogram. Echocardiography is a painless test that uses sound waves to create images of your heart. It provides your doctor with information about the size and shape of your heart and how well your heart's chambers and valves are working. This procedure takes approximately one hour. There are no restrictions for this procedure. To be done in April 2024. This procedure is done at [address] [place]    Follow-Up: At [facility], you and your health needs are our priority.  As part of our continuing mission to provide you with exceptional heart care, we have created designated Provider Care Teams.  These Care Teams include your primary Cardiologist (physician) and Advanced Practice Providers (APPs -  Physician Assistants and Nurse Practitioners) who all work together to provide you with the care you need, when you need it.  We recommend signing up for the patient portal called "MyChart".  Sign up information is provided on this After Visit Summary.  MyChart is used to connect with patients for Virtual Visits (Telemedicine).  Patients are able to view lab/test results, encounter notes, upcoming appointments, etc.  Non-urgent messages can be sent to your provider as well.   To learn more about what you can do with MyChart, go to [email or web address].    Your next appointment:   12 month(s)  The format for your next appointment:   In Person  Provider:   Nanetta Batty, MD

## 2022-04-23 NOTE — Assessment & Plan Note (Signed)
History of hyperlipidemia on statin therapy with lipid profile performed 03/31/2022 revealing total cholesterol 146, LDL 73 and HDL 63.

## 2022-04-23 NOTE — Assessment & Plan Note (Signed)
History of essential hypertension blood pressure measured today at 156/84.  She is on amlodipine, and atenolol.  She is not taking the hydrochlorothiazide which she was on previously.  She says that she checks her blood pressure at home and it runs in the 120/70 range normally.

## 2022-05-04 NOTE — Progress Notes (Unsigned)
Cardiology Office Note:    Date:  05/05/2022   ID:  Sharon Munoz, DOB 1956/08/16, MRN [id number]  PCP:  [name], MD   [facility] Providers Cardiologist:  Nanetta Batty, MD     Referring MD: Runell Gess, MD   CC: [facility] Consulted for the evaluation of HCM at the behest of Dr. Allyson Sabal  History of Present Illness:    [name] is a 66 y.o. female with a hx of HTN with DM, LVH, Carpal Tunnel and LVOT gradient in 2021, who presents for evaluation 05/04/22.  Patient notes that she is feeling well before April of this year (she usually sees her cardiologist once a month).   Was last feeling well in March of this year. 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  She had weakness and palpitations 03/08/22 that she think may be related to her stress and lack of eating.  Without eating she felt dizzy, tired, and weak.  Worse with the heat.  Felt even worse after running up 14 steps to see why her great grandchildren (twins and a two year old from her grand daughter) were crying.  Has hypokalemia.  Has heart rates in 170s.  Was worried this was similar to her stress induced cardiomyopathy.  Had K erplaced and was discharged with no issues.   Denies history of SCD.  Father died at 19 years old.  M GM had CHF. No history of HCM, amyloidosis.  Has had no chest pain, chest pressure, chest tightness, chest stinging, but has rare chest discomfort with exertion Patient exertion notable for walking brother's dog and feels no symptoms.    No shortness of breath, DOE .  No PND or orthopnea.  No weight gain, leg swelling , or abdominal swelling.  No syncope or near syncope . Notes further no palpitations or funny heart beats.      Past Medical History:  Diagnosis Date   Back spasm    Diabetes mellitus    Hypertension    Systolic ejection murmur 1986    Past Surgical History:  Procedure Laterality Date   CESAREAN SECTION     RIGHT/LEFT  HEART CATH AND CORONARY ANGIOGRAPHY N/A 03/08/2020   Procedure: RIGHT/LEFT HEART CATH AND CORONARY ANGIOGRAPHY;  Surgeon: Yvonne Kendall, MD;  Location: [facility];  Service: Cardiovascular;  Laterality: N/A;    Current Medications: Current Meds  Medication Sig   acetaminophen (TYLENOL) 500 MG tablet Take 1,000 mg by mouth every 6 (six) hours as needed for mild pain.   amLODipine (NORVASC) 10 MG tablet Take 1 tablet (10 mg total) by mouth daily.   aspirin EC 81 MG tablet Take 81 mg by mouth daily.   atenolol (TENORMIN) 50 MG tablet Take 1 tablet (50 mg total) by mouth 2 (two) times daily.   CALCIUM PO Take 1 capsule by mouth 2 (two) times a week.   celecoxib (CELEBREX) 200 MG capsule Take 1 capsule (200 mg total) by mouth 2 (two) times daily.   Cholecalciferol (VITAMIN D3 PO) Take 1 capsule by mouth 3 (three) times a week. [employer] Brand   empagliflozin (JARDIANCE) 25 MG TABS tablet Take 1 tablet (25 mg total) by mouth daily.   escitalopram (LEXAPRO) 20 MG tablet Take 1 tablet (20 mg total) by mouth daily.   gabapentin (NEURONTIN) 100 MG capsule Take 3 capsules (300 mg total) by mouth 3 (three) times daily. (Patient taking  differently: Take 100 mg by mouth 2 (two) times daily.)   KLOR-CON M10 10 MEQ tablet Take 10 mEq by mouth 3 (three) times daily. For 30 days   MAGNESIUM PO Take 250 mg by mouth 3 (three) times a week.   Multiple Vitamins-Minerals (HAIR SKIN NAILS PO) Take 1 capsule by mouth 3 (three) times a week.   Omega-3 Fatty Acids (FISH OIL) 1000 MG CAPS Take 1,000 mg by mouth 2 (two) times a week.   POTASSIUM PO Take by mouth.   pravastatin (PRAVACHOL) 20 MG tablet Take 1 tablet (20 mg total) by mouth daily.   TRESIBA FLEXTOUCH 100 UNIT/ML FlexTouch Pen Inject 36 Units into the skin daily.     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  Tobacco Use   Smoking status: Never   Smokeless tobacco: Never  Substance and Sexual Activity   Alcohol use: No    Alcohol/week: 0.0 standard drinks   Drug use: Never   Sexual activity: Not Currently  Other Topics Concern   Not on file  Social History Narrative   Not on file   Social Determinants of Health   Financial Resource Strain: Not on file  Food Insecurity: Not on file  Transportation Needs: Not on file  Physical Activity: Not on file  Stress: Not on file  Social Connections: Not on file    Social: [employer]  Family History: The patient's family history includes Asthma in her brother and father; CVA (age of onset: 36) in her father; Colon cancer (age of onset: 92) in her brother; Hypertension in her father and mother; Kidney cancer in her father. There is no history of Eczema, Immunodeficiency, or Urticaria.  ROS:   Please see the history of present illness.    All other systems reviewed and are negative.  EKGs/Labs/Other Studies Reviewed:    The following studies were reviewed today:  EKG:  EKG is  ordered today.  The ekg ordered today demonstrates  SR rate 74 Septal Infarct  Transthoracic Echocardiogram: Date: 03/19/22 Results:  1. Left ventricular ejection fraction, by estimation, is 65 to 70%. The  left ventricle has normal function. The left ventricle has no regional  wall motion abnormalities. There is severe asymmetric left ventricular  hypertrophy of the basal-septal  segment. Left ventricular diastolic parameters are consistent with Grade I  diastolic dysfunction (impaired relaxation).   2. Right ventricular systolic function is normal. The right ventricular  size is  normal. Tricuspid regurgitation signal is inadequate for assessing  PA pressure.   3. The mitral valve is grossly normal. No evidence of mitral valve  regurgitation. No evidence of mitral stenosis.   4. The aortic valve was not well visualized. There is mild calcification  of the aortic valve. Aortic valve regurgitation is mild. Aortic valve  sclerosis is present, with no evidence of aortic valve stenosis.   NonCardiac CT: Date: 03/19/22 Results: IMPRESSION: 1. No evidence of arterial embolic filling defects. 2. Cardiomegaly without findings of acute CHF. 3. Aortic atherosclerosis, tortuosity. 4. Small hiatal hernia with contrast in the distal esophagus  and hiatal hernia. 5. Increased findings of lower lobe bronchitis but no findings of acute bronchopneumonia or aspiration. 6. 1.4 cm nodule left lobe of the thyroid gland. No imaging follow-up recommended.    ECG or NM Stress Testing : Date: 09/30/99 Results: NO PERFUSION DEFECTS ARE IDENTIFIED TO SUGGEST SCAR OR INDUCIBLE ISCHEMIA.  EJECTION FRACTION CALCULATION:  END-DIASTOLIC VOLUME IS 100 ML.   END-SYSTOLIC VOLUME IS 32 ML.  DERIVED EJECTION FRACTION IS  68%.  WALL MOTION ANALYSIS:  LEFT VENTRICULAR WALL MOTION IS NORMAL THROUGHOUT.  IMPRESSION  Left/Right Heart Catheterizations: Date: 03/08/20 Results: Conclusions: Tortuous coronary arteries without angiographically significant stenosis. Upper normal to mildly elevated left heart and pulmonary artery pressures. Normal right heart filling pressures. Normal cardiac output/index. Dynamic LVOT gradient of 25 mmHg at rest and 65-70 mmHg post-PVC.   Recommendations: Primary prevention of coronary artery disease. Hold amlodipine and increase metoprolol tartrate to 100 mg twice daily to improve rate control.   Recent Labs: 03/19/2022: TSH 1.718 03/20/2022: BUN 18; Creatinine, Ser 0.51; Hemoglobin 13.8; Magnesium 2.0; Platelets 231; Potassium 3.2; Sodium 138  Recent Lipid  Panel    Component Value Date/Time   CHOL 143 03/08/2020 0815   TRIG 41 03/08/2020 0815   HDL 80 03/08/2020 0815   CHOLHDL 1.8 03/08/2020 0815   VLDL 8 03/08/2020 0815   LDLCALC 55 03/08/2020 0815   LDLDIRECT 119.6 10/04/2007 0919       Physical Exam:    VS:  BP 132/78   Pulse 76   Ht  (1.6 m)   Wt 151 lb 9.6 oz (68.8 kg)   SpO2 98%   BMI 26.85 kg/m     Wt Readings from Last 3 Encounters:  05/05/22 151 lb 9.6 oz (68.8 kg)  04/23/22 154 lb 9.6 oz (70.1 kg)  03/20/22 140 lb 14.4 oz (63.9 kg)     Gen: no distress   Neck: No JVD Cardiac: No Rubs or Gallops, soft systolic murmur that significantly increases with standing up, RRR +2 radial pulses Respiratory: Clear to auscultation bilaterally, normal effort, normal  respiratory rate GI: Soft, nontender, non-distended  MS: No  edema;  moves all extremities Integument: Skin feels warn Neuro:  At time of evaluation, alert and oriented to person/place/time/situation  Psych: Normal affect, patient feels nervous   ASSESSMENT:    1. Palpitations   2. Asymmetric septal hypertrophy (HCC)   3. Cardiomyopathy, unspecified type (HCC)    PLAN:    LVH with prior LVOT Obstruction Concern for Hypertrophic Cardiomyopathy Carpal Tunnel syndrome but with description less suggestive of cardiac amyloidosis Palpitations and near syncope event Hx of Takotsubo  - NYHA I presently  - She describes the event from 03/18/22; this could be related to SVT or an inducible LVOT obstruction vs intravascular volume depletion, will get zio patch  - Will get CMR to help with diagnosis - if consistent with HCM, will get stress echo for SCD risk eval and for inducible obstruction eval - if suggestive of amyloidosis will get pyrophospate scan (less likely but on DDX0  - Family history negative, we will pend further evaluation before deciding on family screning, Discussed family screening   - will stop HCTZ, continue norvasc and atenolol for  now; may transition to other CCB or BB in the future  Fall f/u with me for  diagnosis clarification, SCD f/u, and to review sx  Time Spent Directly with Patient:   I have spent a total of 40 minutes with the patient reviewing notes,  imaging, EKGs, labs and examining the patient as well as establishing an assessment and plan that was discussed personally with the patient.  > 50% of time was spent in direct patient care.    Medication Adjustments/Labs and Tests Ordered: Current medicines are reviewed at length with the patient today.  Concerns regarding medicines are outlined above.  Orders Placed This Encounter  Procedures   MR CARDIAC MORPHOLOGY W WO CONTRAST   CBC   LONG TERM MONITOR (3-14 DAYS)   No orders of the defined types were placed in this encounter.   Patient Instructions  Medication Instructions:  Your physician has recommended you make the following change in your medication:  STOP: hydrochlorothiazide (HCTZ)  *If you need a refill on your cardiac medications before your next appointment, please call your pharmacy*   Lab Work: TODAY: CBC If you have labs (blood work) drawn today and your tests are completely normal, you will receive your results only by: MyChart Message (if you have MyChart) OR A paper copy in the mail If you have any lab test that is abnormal or we need to change your treatment, we will call you to review the results.   Testing/Procedures: Your physician has requested that you have a cardiac MRI. Cardiac MRI uses a computer to create images of your heart as its beating, producing both still and moving pictures of your heart and major blood vessels. For further information please visit [email or web address]. Please follow the instruction sheet given to you today for more information.   Your physician has requested that you wear a 14 day heart monitor.   Follow-Up: At [facility], you and your health needs are our priority.  As part of our  continuing mission to provide you with exceptional heart care, we have created designated Provider Care Teams.  These Care Teams include your primary Cardiologist (physician) and Advanced Practice Providers (APPs -  Physician Assistants and Nurse Practitioners) who all work together to provide you with the care you need, when you need it.  We recommend signing up for the patient portal called "MyChart".  Sign up information is provided on this After Visit Summary.  MyChart is used to connect with patients for Virtual Visits (Telemedicine).  Patients are able to view lab/test results, encounter notes, upcoming appointments, etc.  Non-urgent messages can be sent to your provider as well.   To learn more about what you can do with MyChart, go to [email or web address].    Your next appointment:   5 month(s)  The format for your next appointment:   In Person  Provider:   Riley Lam, MD  Other Instructions   You are scheduled for Cardiac MRI on ______________. Please arrive for your appointment at ______________ ( arrive 30-45 minutes prior to test start time). ?  [facility] [address] [place], [place] [address] [phone] Please take advantage of the free valet parking available at the MAIN entrance (A entrance).  Proceed to the [facility] Radiology Department (First Floor) for check-in.    Magnetic resonance imaging (MRI) is a painless test that produces images of the inside of the body without using Xrays.  During an MRI, strong magnets and radio waves work together in a Data processing manager to form detailed images.   MRI images may provide more details about a medical condition than X-rays, CT scans, and ultrasounds can provide.  You may be given earphones to listen for instructions.  You may eat a light breakfast and take medications  as ordered with the exception of HCTZ (fluid pill, other). Please avoid stimulants for 12 hr prior to test. (Ie. Caffeine,  nicotine, chocolate, or antihistamine medications)  If a contrast material will be used, an IV will be inserted into one of your veins. Contrast material will be injected into your IV. It will leave your body through your urine within a day. You may be told to drink plenty of fluids to help flush the contrast material out of your system.  You will be asked to remove all metal, including: Watch, jewelry, and other metal objects including hearing aids, hair pieces and dentures. Also wearable glucose monitoring systems (ie. Freestyle Libre and Omnipods) (Braces and fillings normally are not a problem.)   TEST WILL TAKE APPROXIMATELY 1 HOUR  PLEASE NOTIFY SCHEDULING AT LEAST 24 HOURS IN ADVANCE IF YOU ARE UNABLE TO KEEP YOUR APPOINTMENT. [phone]  Please call Rockwell AlexandriaSara Wallace, cardiac imaging nurse navigator with any questions/concerns. Rockwell AlexandriaSara Wallace RN Navigator Cardiac Imaging Larey BrickMerle Prescott RN Navigator Cardiac Imaging [facility] [phone] Office     ZIO XT- Long Term Monitor Instructions  Your physician has requested you wear a ZIO patch monitor for 14 days.  This is a single patch monitor. [employer] supplies one patch monitor per enrollment. Additional stickers are not available. Please do not apply patch if you will be having a Nuclear Stress Test,  Echocardiogram, Cardiac CT, MRI, or Chest Xray during the period you would be wearing the  monitor. The patch cannot be worn during these tests. You cannot remove and re-apply the  ZIO XT patch monitor.  Your ZIO patch monitor will be mailed 3 day USPS to your address on file. It may take 3-5 days  to receive your monitor after you have been enrolled.  Once you have received your monitor, please review the enclosed instructions. Your monitor  has already been registered assigning a specific monitor serial # to you.  Billing and Patient Assistance Program Information  We have supplied [employer] with any of your  insurance information on file for billing purposes. [employer] offers a sliding scale Patient Assistance Program for patients that do not have  insurance, or whose insurance does not completely cover the cost of the ZIO monitor.  You must apply for the Patient Assistance Program to qualify for this discounted rate.  To apply, please call [employer] at [phone], select option 4, select option 2, ask to apply for  Patient Assistance Program. Meredeth [employer] will ask your household income, and how many people  are in your household. They will quote your out-of-pocket cost based on that information.  [employer] will also be able to set up a [address]109-month, interest-free payment plan if needed.  Applying the monitor   Shave hair from upper left chest.  Hold abrader disc by orange tab. Rub abrader in 40 strokes over the upper left chest as  indicated in your monitor instructions.  Clean area with 4 enclosed alcohol pads. Let dry.  Apply patch as indicated in monitor instructions. Patch will be placed under collarbone on left  side of chest with arrow pointing upward.  Rub patch adhesive wings for 2 minutes. Remove white label marked "1". Remove the white  label marked "2". Rub patch adhesive wings for 2 additional minutes.  While looking in a mirror, press and release button in center of patch. A small green light will  flash 3-4 times. This will be your only indicator that the monitor has been turned on.  Do not shower for the first 24 hours. You may shower after the first 24 hours.  Press the button if you feel a symptom. You will hear a small click. Record Date, Time and  Symptom in the Patient Logbook.  When you are ready to remove the patch, follow instructions on the last 2 pages of Patient  Logbook. Stick patch monitor onto the last page of Patient Logbook.  Place Patient Logbook in the blue and white box. Use locking tab on box and tape box closed  securely. The blue and white box has prepaid postage on  it. Please place it in the mailbox as  soon as possible. Your physician should have your test results approximately 7 days after the  monitor has been mailed back to [facility].  Call [facility] Customer Care at [phone] if you have questions regarding  your ZIO XT patch monitor. Call them immediately if you see an orange light blinking on your  monitor.  If your monitor falls off in less than 4 days, contact our Monitor department at [phone].  If your monitor becomes loose or falls off after 4 days call [employer] at [phone] for  suggestions on securing your monitor   Important Information About Sugar         Signed, Christell Constant, MD  05/05/2022 10:00 AM    [facility]

## 2022-05-05 ENCOUNTER — Ambulatory Visit (INDEPENDENT_AMBULATORY_CARE_PROVIDER_SITE_OTHER): Payer: Medicare Other

## 2022-05-05 ENCOUNTER — Encounter: Payer: Self-pay | Admitting: Internal Medicine

## 2022-05-05 ENCOUNTER — Ambulatory Visit (INDEPENDENT_AMBULATORY_CARE_PROVIDER_SITE_OTHER): Payer: Medicare Other | Admitting: Internal Medicine

## 2022-05-05 VITALS — BP 132/78 | HR 76 | Ht 63.0 in | Wt 151.6 lb

## 2022-05-05 DIAGNOSIS — I422 Other hypertrophic cardiomyopathy: Secondary | ICD-10-CM | POA: Diagnosis not present

## 2022-05-05 DIAGNOSIS — I429 Cardiomyopathy, unspecified: Secondary | ICD-10-CM | POA: Diagnosis not present

## 2022-05-05 DIAGNOSIS — R002 Palpitations: Secondary | ICD-10-CM

## 2022-05-05 LAB — CBC
Hematocrit: 42.1 % (ref 34.0–46.6)
Hemoglobin: 14.6 g/dL (ref 11.1–15.9)
MCH: 30.4 pg (ref 26.6–33.0)
MCHC: 34.7 g/dL (ref 31.5–35.7)
MCV: 88 fL (ref 79–97)
Platelets: 259 10*3/uL (ref 150–450)
RBC: 4.81 x10E6/uL (ref 3.77–5.28)
RDW: 13.5 % (ref 11.7–15.4)
WBC: 6.7 10*3/uL (ref 3.4–10.8)

## 2022-05-05 NOTE — Progress Notes (Unsigned)
Enrolled for [employer] to mail a ZIO XT long term holter monitor to the patients address on file.  

## 2022-05-05 NOTE — Patient Instructions (Addendum)
Medication Instructions:  Your physician has recommended you make the following change in your medication:  STOP: hydrochlorothiazide (HCTZ)  *If you need a refill on your cardiac medications before your next appointment, please call your pharmacy*   Lab Work: TODAY: CBC If you have labs (blood work) drawn today and your tests are completely normal, you will receive your results only by: MyChart Message (if you have MyChart) OR A paper copy in the mail If you have any lab test that is abnormal or we need to change your treatment, we will call you to review the results.   Testing/Procedures: Your physician has requested that you have a cardiac MRI. Cardiac MRI uses a computer to create images of your heart as its beating, producing both still and moving pictures of your heart and major blood vessels. For further information please visit [email or web address]. Please follow the instruction sheet given to you today for more information.   Your physician has requested that you wear a 14 day heart monitor.   Follow-Up: At [facility], you and your health needs are our priority.  As part of our continuing mission to provide you with exceptional heart care, we have created designated Provider Care Teams.  These Care Teams include your primary Cardiologist (physician) and Advanced Practice Providers (APPs -  Physician Assistants and Nurse Practitioners) who all work together to provide you with the care you need, when you need it.  We recommend signing up for the patient portal called "MyChart".  Sign up information is provided on this After Visit Summary.  MyChart is used to connect with patients for Virtual Visits (Telemedicine).  Patients are able to view lab/test results, encounter notes, upcoming appointments, etc.  Non-urgent messages can be sent to your provider as well.   To learn more about what you can do with MyChart, go to [email or web address].    Your next appointment:   5  month(s)  The format for your next appointment:   In Person  Provider:   Riley Lam, MD  Other Instructions   You are scheduled for Cardiac MRI on ______________. Please arrive for your appointment at ______________ ( arrive 30-45 minutes prior to test start time). ?  [facility] [address] [place], [place] [address] [phone] Please take advantage of the free valet parking available at the MAIN entrance (A entrance).  Proceed to the [facility] Radiology Department (First Floor) for check-in.    Magnetic resonance imaging (MRI) is a painless test that produces images of the inside of the body without using Xrays.  During an MRI, strong magnets and radio waves work together in a Data processing manager to form detailed images.   MRI images may provide more details about a medical condition than X-rays, CT scans, and ultrasounds can provide.  You may be given earphones to listen for instructions.  You may eat a light breakfast and take medications as ordered with the exception of HCTZ (fluid pill, other). Please avoid stimulants for 12 hr prior to test. (Ie. Caffeine, nicotine, chocolate, or antihistamine medications)  If a contrast material will be used, an IV will be inserted into one of your veins. Contrast material will be injected into your IV. It will leave your body through your urine within a day. You may be told to drink plenty of fluids to help flush the contrast material out of your system.  You will be asked to remove all metal, including: Watch, jewelry, and other metal objects including hearing aids,  hair pieces and dentures. Also wearable glucose monitoring systems (ie. Freestyle Libre and Omnipods) (Braces and fillings normally are not a problem.)   TEST WILL TAKE APPROXIMATELY 1 HOUR  PLEASE NOTIFY SCHEDULING AT LEAST 24 HOURS IN ADVANCE IF YOU ARE UNABLE TO KEEP YOUR APPOINTMENT. [phone]  Please call Rockwell AlexandriaSara Wallace, cardiac imaging  nurse navigator with any questions/concerns. Rockwell AlexandriaSara Wallace RN Navigator Cardiac Imaging Larey BrickMerle Prescott RN Navigator Cardiac Imaging [facility] [phone] Office     ZIO XT- Long Term Monitor Instructions  Your physician has requested you wear a ZIO patch monitor for 14 days.  This is a single patch monitor. [employer] supplies one patch monitor per enrollment. Additional stickers are not available. Please do not apply patch if you will be having a Nuclear Stress Test,  Echocardiogram, Cardiac CT, MRI, or Chest Xray during the period you would be wearing the  monitor. The patch cannot be worn during these tests. You cannot remove and re-apply the  ZIO XT patch monitor.  Your ZIO patch monitor will be mailed 3 day USPS to your address on file. It may take 3-5 days  to receive your monitor after you have been enrolled.  Once you have received your monitor, please review the enclosed instructions. Your monitor  has already been registered assigning a specific monitor serial # to you.  Billing and Patient Assistance Program Information  We have supplied [employer] with any of your insurance information on file for billing purposes. [employer] offers a sliding scale Patient Assistance Program for patients that do not have  insurance, or whose insurance does not completely cover the cost of the ZIO monitor.  You must apply for the Patient Assistance Program to qualify for this discounted rate.  To apply, please call [employer] at [phone], select option 4, select option 2, ask to apply for  Patient Assistance Program. M[employer]eth [employer] will ask your household income, and how many people  are in your household. They will quote your out-of-pocket cost based on that information.  [employer] will also be able to set up a 1254-month, interest-free payment plan if needed.  Applying the monitor   Shave hair from upper left chest.  Hold abrader disc by orange tab. Rub abrader in 40 strokes  over the upper left chest as  indicated in your monitor instructions.  Clean area with 4 enclosed alcohol pads. Let dry.  Apply patch as indicated in monitor instructions. Patch will be placed under collarbone on left  side of chest with arrow pointing upward.  Rub patch adhesive wings for 2 minutes. Remove white label marked "1". Remove the white  label marked "2". Rub patch adhesive wings for 2 additional minutes.  While looking in a mirror, press and release button in center of patch. A small green light will  flash 3-4 times. This will be your only indicator that the monitor has been turned on.  Do not shower for the first 24 hours. You may shower after the first 24 hours.  Press the button if you feel a symptom. You will hear a small click. Record Date, Time and  Symptom in the Patient Logbook.  When you are ready to remove the patch, follow instructions on the last 2 pages of Patient  Logbook. Stick patch monitor onto the last page of Patient Logbook.  Place Patient Logbook in the blue and white box. Use locking tab on box and tape box closed  securely. The blue and white box has prepaid postage  on it. Please place it in the mailbox as  soon as possible. Your physician should have your test results approximately 7 days after the  monitor has been mailed back to [facility].  Call [facility] Customer Care at [phone] if you have questions regarding  your ZIO XT patch monitor. Call them immediately if you see an orange light blinking on your  monitor.  If your monitor falls off in less than 4 days, contact our Monitor department at [phone].  If your monitor becomes loose or falls off after 4 days call [employer] at [phone] for  suggestions on securing your monitor   Important Information About Sugar

## 2022-05-12 DIAGNOSIS — R002 Palpitations: Secondary | ICD-10-CM | POA: Diagnosis not present

## 2022-06-03 ENCOUNTER — Ambulatory Visit
Admission: RE | Admit: 2022-06-03 | Discharge: 2022-06-03 | Disposition: A | Discharge status: Home / Self Care | Payer: Medicare Other | Source: Ambulatory Visit | Attending: Internal Medicine | Admitting: Internal Medicine

## 2022-06-03 DIAGNOSIS — Z1231 Encounter for screening mammogram for malignant neoplasm of breast: Secondary | ICD-10-CM

## 2022-07-10 ENCOUNTER — Encounter (HOSPITAL_COMMUNITY): Payer: Self-pay

## 2022-07-13 ENCOUNTER — Telehealth (HOSPITAL_COMMUNITY): Payer: Self-pay | Admitting: *Deleted

## 2022-07-13 NOTE — Telephone Encounter (Signed)
Attempted to call patient regarding upcoming cardiac MRI appointment. Left message on voicemail with name and callback number  Yaacov Koziol RN Navigator Cardiac Imaging [facility] [phone] Office [phone] Cell  

## 2022-07-14 ENCOUNTER — Other Ambulatory Visit: Payer: Self-pay | Admitting: Internal Medicine

## 2022-07-14 ENCOUNTER — Ambulatory Visit (HOSPITAL_COMMUNITY)
Admission: RE | Admit: 2022-07-14 | Discharge: 2022-07-14 | Disposition: A | Discharge status: Home / Self Care | Payer: Medicare Other | Source: Ambulatory Visit | Attending: Internal Medicine | Admitting: Internal Medicine

## 2022-07-14 DIAGNOSIS — I422 Other hypertrophic cardiomyopathy: Secondary | ICD-10-CM

## 2022-07-14 DIAGNOSIS — R002 Palpitations: Secondary | ICD-10-CM

## 2022-07-14 MED ORDER — GADOBUTROL 1 MMOL/ML IV SOLN
6.0000 mL | Freq: Once | INTRAVENOUS | Status: AC | PRN
  Administered 2022-07-14: 6 mL via INTRAVENOUS

## 2022-10-06 NOTE — Progress Notes (Unsigned)
Cardiology Office Note:    Date:  10/07/2022   ID:  Sharon Munoz, DOB 11/08/1956, MRN [id number]  PCP:  Gaspar Garbeisovec, Richard W, MD   [facility] Providers Cardiologist:  Nanetta BattyJonathan Berry, MD     Referring MD: Gaspar Garbeisovec, Richard W, MD   CC: LVH  History of Present Illness:    Sharon Munoz is a 66 y.o. female with a hx of HTN with DM, LVH, Carpal Tunnel and LVOT gradient in 2021, who presents for evaluation 05/04/22.  Seen in f/u after CMR, not consistent with HCM or Cardiac amyloidosis.  Patient notes that she is doing great.   She is taking of her grandchildren. 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 There are no interval hospital/ED visit.    No chest pain or pressure .  No SOB no PND/Orthopnea.  No weight gain or leg swelling.  Notes similar palpitations to June .  Does note fatigue and DOE with incline.  Will no DOE when she is dog walking.  Past Medical History:  Diagnosis Date   Back spasm    Diabetes mellitus    Hypertension    Systolic ejection murmur 1986    Past Surgical History:  Procedure Laterality Date   CESAREAN SECTION     RIGHT/LEFT HEART CATH AND CORONARY ANGIOGRAPHY N/A 03/08/2020   Procedure: RIGHT/LEFT HEART CATH AND CORONARY ANGIOGRAPHY;  Surgeon: Yvonne KendallEnd, Christopher, MD;  Location: [facility];  Service: Cardiovascular;  Laterality: N/A;    Current Medications: Current Meds  Medication Sig   acetaminophen (TYLENOL) 500 MG tablet Take 1,000 mg by mouth every 6 (six) hours as needed for mild pain.   amLODipine (NORVASC) 10 MG tablet Take 1 tablet (10 mg total) by mouth daily.   Ascorbic Acid (VITAMIN C) 500 MG CHEW daily at 6 (six) AM.   aspirin EC 81 MG tablet Take 81 mg by mouth daily.   atenolol (TENORMIN) 50 MG tablet Take 1 tablet (50 mg total) by mouth 2 (two) times daily.   Calcium Carb-Cholecalciferol (CALCIUM 500 + D3) 500-15 MG-MCG TABS 2 (two) times a week.   celecoxib (CELEBREX) 200 MG capsule Take 1 capsule (200 mg  total) by mouth 2 (two) times daily. (Patient taking differently: Take 200 mg by mouth as needed for moderate pain.)   Cholecalciferol (VITAMIN D3 PO) Take 1 capsule by mouth 4 (four) times a week. [employer] Brand   empagliflozin (JARDIANCE) 25 MG TABS tablet Take 1 tablet (25 mg total) by mouth daily.   escitalopram (LEXAPRO) 20 MG tablet Take 1 tablet (20 mg total) by mouth daily.   fluticasone (FLONASE) 50 MCG/ACT nasal spray as needed for allergies.   gabapentin (NEURONTIN) 100 MG capsule Take 3 capsules (300 mg total) by mouth 3 (three) times daily. (Patient taking differently: Take 300 mg by mouth 2 (two) times daily.)   ibandronate (BONIVA) 150 MG tablet every 30 (thirty) days.   KLOR-CON M10 10 MEQ tablet Take 10 mEq by mouth 3 (three) times a week. For 30 days   levocetirizine (XYZAL) 5 MG tablet as needed for allergies.   Magnesium 250 MG TABS daily.   Multiple Vitamins-Minerals (HAIR SKIN NAILS PO) Take 1 capsule by mouth 3 (three) times a week.   Omega-3 Fatty Acids (FISH OIL) 1000 MG CAPS Take 1,000 mg by mouth 2 (two) times a week.   pravastatin (PRAVACHOL) 20 MG tablet Take 1 tablet (20 mg total) by mouth daily.   TRESIBA FLEXTOUCH  100 UNIT/ML FlexTouch Pen Inject 36 Units into the skin daily.     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  Tobacco Use   Smoking status: Never   Smokeless tobacco: Never  Substance and Sexual Activity   Alcohol use: No    Alcohol/week: 0.0 standard drinks of alcohol   Drug use: Never   Sexual activity: Not Currently  Other Topics Concern   Not  on file  Social History Narrative   Not on file   Social Determinants of Health   Financial Resource Strain: Not on file  Food Insecurity: Not on file  Transportation Needs: Not on file  Physical Activity: Not on file  Stress: Not on file  Social Connections: Not on file    Social: [employer]  Family History: The patient's family history includes Asthma in her brother and father; CVA (age of onset: 49) in her father; Colon cancer (age of onset: 60) in her brother; Hypertension in her father and mother; Kidney cancer in her father. There is no history of Eczema, Immunodeficiency, or Urticaria.  ROS:   Please see the history of present illness.    All other systems reviewed and are negative.  EKGs/Labs/Other Studies Reviewed:    The following studies were reviewed today:  EKG:   SR rate 74 Septal Infarct  Cardiac Studies & Procedures   CARDIAC CATHETERIZATION  CARDIAC CATHETERIZATION 03/08/2020  Narrative Conclusions: 1. Tortuous coronary arteries without angiographically significant stenosis. 2. Upper normal to mildly elevated left heart and pulmonary artery pressures. 3. Normal right heart filling pressures. 4. Normal cardiac output/index. 5. Dynamic LVOT gradient of 25 mmHg at rest and 65-70 mmHg post-PVC.  Recommendations: 1. Primary prevention of coronary artery disease. 2. Hold amlodipine and increase metoprolol tartrate to 100 mg twice daily to improve rate control.  Yvonne Kendall, MD [facility]  Findings Coronary Findings Diagnostic  Dominance: Right  Left Main Vessel is large.  Left Anterior Descending Vessel is large. Vessel is angiographically normal. The vessel is severely tortuous.  First Diagonal Branch Vessel is moderate in size.  Second Diagonal Branch Vessel is small in size.  Third Diagonal Branch Vessel is moderate in size.  Left Circumflex Vessel is large. Vessel is angiographically normal. The vessel is  moderately tortuous.  First Obtuse Marginal Branch Vessel is large in size.  Second Obtuse Marginal Branch Vessel is small in size.  Third Obtuse Marginal Branch Vessel is small in size.  Right Coronary Artery Vessel is large. Vessel is angiographically normal.  Right Posterior Descending Artery Vessel is moderate in size. The vessel is tortuous.  Right Posterior Atrioventricular Artery Vessel is moderate in size.  Intervention  No interventions have been documented.     ECHOCARDIOGRAM  ECHOCARDIOGRAM COMPLETE 03/19/2022  Narrative ECHOCARDIOGRAM REPORT    Patient Name:   CARMELLA KEES Date of Exam: 03/19/2022 Medical Rec #:  [id number]        Height:       63.0 in Accession #:    [id number]       Weight:  147.0 lb Date of Birth:  [birth year]         BSA:          1.696 m Patient Age:    65 years         BP:           141/83 mmHg Patient Gender: F                HR:           64 bpm. Exam Location:  Inpatient  Procedure: 2D Echo, Cardiac Doppler and Color Doppler  Indications:    Elevated Troponin  History:        Patient has prior history of Echocardiogram examinations, most recent 06/05/2020. Signs/Symptoms:Murmur; Risk Factors:Diabetes and Hypertension.  Sonographer:    Neomia Dear RDCS Referring Phys: [id number] TIMOTHY S OPYD  IMPRESSIONS   1. Left ventricular ejection fraction, by estimation, is 65 to 70%. The left ventricle has normal function. The left ventricle has no regional wall motion abnormalities. There is severe asymmetric left ventricular hypertrophy of the basal-septal segment. Left ventricular diastolic parameters are consistent with Grade I diastolic dysfunction (impaired relaxation). 2. Right ventricular systolic function is normal. The right ventricular size is normal. Tricuspid regurgitation signal is inadequate for assessing PA pressure. 3. The mitral valve is grossly normal. No evidence of mitral valve regurgitation. No evidence of  mitral stenosis. 4. The aortic valve was not well visualized. There is mild calcification of the aortic valve. Aortic valve regurgitation is mild. Aortic valve sclerosis is present, with no evidence of aortic valve stenosis.  Comparison(s): Compared to prior, apical LV function has improved. There is no LVOT obstruction or systolic anterior motion of the mitral valve. Basal septal hypertrophy is still present.  FINDINGS Left Ventricle: Left ventricular ejection fraction, by estimation, is 65 to 70%. The left ventricle has normal function. The left ventricle has no regional wall motion abnormalities. The left ventricular internal cavity size was small. There is severe asymmetric left ventricular hypertrophy of the basal-septal segment. Left ventricular diastolic parameters are consistent with Grade I diastolic dysfunction (impaired relaxation).  Right Ventricle: The right ventricular size is normal. No increase in right ventricular wall thickness. Right ventricular systolic function is normal. Tricuspid regurgitation signal is inadequate for assessing PA pressure.  Left Atrium: Left atrial size was normal in size.  Right Atrium: Right atrial size was normal in size.  Pericardium: There is no evidence of pericardial effusion.  Mitral Valve: The mitral valve is grossly normal. No evidence of mitral valve regurgitation. No evidence of mitral valve stenosis. MV peak gradient, 4.8 mmHg. The mean mitral valve gradient is 2.0 mmHg.  Tricuspid Valve: The tricuspid valve is grossly normal. Tricuspid valve regurgitation is not demonstrated. No evidence of tricuspid stenosis.  Aortic Valve: The aortic valve was not well visualized. There is mild calcification of the aortic valve. Aortic valve regurgitation is mild. Aortic regurgitation PHT measures 509 msec. Aortic valve sclerosis is present, with no evidence of aortic valve stenosis. Aortic valve mean gradient measures 4.0 mmHg. Aortic valve peak gradient  measures 6.9 mmHg. Aortic valve area, by VTI measures 2.35 cm.  Pulmonic Valve: The pulmonic valve was not well visualized. Pulmonic valve regurgitation is not visualized. No evidence of pulmonic stenosis.  Aorta: The aortic root and ascending aorta are structurally normal, with no evidence of dilitation.  IAS/Shunts: No atrial level shunt detected by color flow Doppler.   LEFT VENTRICLE PLAX 2D LVIDd:  3.55 cm     Diastology LVIDs:         2.09 cm     LV e' medial:    4.65 cm/s LV PW:         1.03 cm     LV E/e' medial:  13.4 LV IVS:        1.42 cm     LV e' lateral:   6.68 cm/s LVOT diam:     1.70 cm     LV E/e' lateral: 9.3 LV SV:         71 LV SV Index:   42 LVOT Area:     2.27 cm  LV Volumes (MOD) LV vol d, MOD A2C: 45.3 ml LV vol d, MOD A4C: 54.4 ml LV vol s, MOD A2C: 13.3 ml LV vol s, MOD A4C: 15.4 ml LV SV MOD A2C:     32.0 ml LV SV MOD A4C:     54.4 ml LV SV MOD BP:      35.8 ml  RIGHT VENTRICLE RV S prime:     11.30 cm/s TAPSE (M-mode): 2.7 cm  LEFT ATRIUM             Index        RIGHT ATRIUM           Index LA diam:        3.30 cm 1.95 cm/m   RA Area:     14.60 cm LA Vol (A2C):   30.2 ml 17.80 ml/m  RA Volume:   39.10 ml  23.05 ml/m LA Vol (A4C):   34.3 ml 20.22 ml/m LA Biplane Vol: 32.1 ml 18.92 ml/m AORTIC VALVE                     PULMONIC VALVE AV Area (Vmax):    2.39 cm      PV Vmax:       1.21 m/s AV Area (Vmean):   2.45 cm      PV Peak grad:  5.9 mmHg AV Area (VTI):     2.35 cm AV Vmax:           131.00 cm/s AV Vmean:          100.000 cm/s AV VTI:            0.301 m AV Peak Grad:      6.9 mmHg AV Mean Grad:      4.0 mmHg LVOT Vmax:         138.00 cm/s LVOT Vmean:        108.000 cm/s LVOT VTI:          0.312 m LVOT/AV VTI ratio: 1.04 AI PHT:            509 msec  AORTA Ao Root diam: 2.40 cm Ao Asc diam:  3.10 cm  MITRAL VALVE MV Area (PHT): 2.46 cm     SHUNTS MV Area VTI:   2.54 cm     Systemic VTI:  0.31 m MV Peak grad:   4.8 mmHg     Systemic Diam: 1.70 cm MV Mean grad:  2.0 mmHg MV Vmax:       1.10 m/s MV Vmean:      68.7 cm/s MV Decel Time: 309 msec MV E velocity: 62.40 cm/s MV A velocity: 103.00 cm/s MV E/A ratio:  0.61  Riley Lam MD Electronically signed by Riley Lam MD Signature Date/Time: 03/19/2022/4:04:43 PM    Final  MONITORS  LONG TERM MONITOR (3-14 DAYS) 05/31/2022  Narrative  Patient had a minimum heart rate of 31 bpm, maximum heart rate of 125 bpm, and average heart rate of 75 bpm.  Predominant underlying rhythm was sinus rhythm.  One four beat run of NSVT with a max rate of 109 bpm at fastest.  Isolated PACs were rare (<1.0%).  Isolated PVCs were rare (<1.0%).  Mobitz Type I heart block seen (7 AM, heart rate 31 bpm).  Triggered and diary events associated with sinus rhythm.  Asymptomatic morning Mobitz Type I heart block.    CARDIAC MRI  MR CARDIAC MORPHOLOGY W WO CONTRAST 07/14/2022  Narrative CLINICAL DATA:  Clinical question of hypertrophic cardiomyopathy Study assumes HCT of 42 and BSA of 1.75 m2.  EXAM: CARDIAC MRI  TECHNIQUE: The patient was scanned on a 1.5 Tesla GE magnet. A dedicated cardiac coil was used. Functional imaging was done using Fiesta sequences. 2,3, and 4 chamber views were done to assess for RWMA's. Modified Simpson's rule using a short axis stack was used to calculate an ejection fraction on a dedicated work [employer]. The patient received 10 cc of Gadavist. After 10 minutes inversion recovery sequences were used to assess for infiltration and scar tissue.  CONTRAST:  10 cc  of Gadavist  FINDINGS: 1. Normal left ventricular size, with LVEDD 54 mm, and LVEDVi 78 mL/m2.  Mild asymmetric remodeling, with mid-intraventricular septal thickness of 12 mm, posterior wall thickness of 8 mm, and myocardial mass index of 55 g/m2.  Normal left ventricular systolic function (LVEF =64%). There are  no regional wall motion abnormalities. There is no systolic motion of the anterior valve.  Left ventricular parametric mapping notable for normal T2 and ECV.  There is no late gadolinium enhancement in the left ventricular myocardium.  2. Normal right ventricular size with RVEDVI 75 mL/m2.  Normal right ventricular thickness.  Normal right ventricular systolic function (RVEF =52%). There are no regional wall motion abnormalities or aneurysms.  3.  Normal left and right atrial size.  4. Normal size of the aortic root, ascending aorta and pulmonary artery.  5. Valve assessment:  Aortic Valve: Tri-leaflet aortic valve. No aortic stenosis. Regurgitant fraction 1 %. No significant regurgitation.  Pulmonic Valve: Regurgitant fraction 1 %. No significant regurgitation.  Tricuspid Valve: Regurgitant fraction 7 %. Mild central regurgitation.  Mitral Valve: Regurgitant fraction 8 %.  Mild central regurgitation.  6.  Normal pericardium.  No pericardial effusion.  7. Grossly, no extracardiac findings. Recommended dedicated study if concerned for non-cardiac pathology.  IMPRESSION: Study does not meet diagnostic criteria for hypertrophic cardiomyopathy.  Riley Lam MD   Electronically Signed By: Riley Lam M.D. On: 07/14/2022 21:36            Recent Labs: 03/19/2022: TSH 1.718 03/20/2022: BUN 18; Creatinine, Ser 0.51; Magnesium 2.0; Potassium 3.2; Sodium 138 05/05/2022: Hemoglobin 14.6; Platelets 259  Recent Lipid Panel    Component Value Date/Time   CHOL 143 03/08/2020 0815   TRIG 41 03/08/2020 0815   HDL 80 03/08/2020 0815   CHOLHDL 1.8 03/08/2020 0815   VLDL 8 03/08/2020 0815   LDLCALC 55 03/08/2020 0815   LDLDIRECT 119.6 10/04/2007 0919       Physical Exam:    VS:  BP 130/68   Pulse 71   Ht 5\' 3"  (1.6 m)   Wt 153 lb (69.4 kg)   SpO2 96%   BMI 27.10 kg/m     Wt Readings  from Last 3 Encounters:  10/07/22 153 lb (69.4 kg)   05/05/22 151 lb 9.6 oz (68.8 kg)  04/23/22 154 lb 9.6 oz (70.1 kg)   Gen: no distress   Neck: No JVD Cardiac: No Rubs or Gallops, soft systolic murmur , RRR +2 radial pulses Respiratory: Clear to auscultation bilaterally, normal effort, normal  respiratory rate GI: Soft, nontender, non-distended  MS: No  edema;  moves all extremities Integument: Skin feels warn Neuro:  At time of evaluation, alert and oriented to person/place/time/situation  Psych: Normal affect, patient feels nervous   ASSESSMENT:    1. Nonrheumatic mitral valve regurgitation   2. DOE (dyspnea on exertion)   3. LVH (left ventricular hypertrophy)   4. Palpitations     PLAN:    DOE LVH with prior LVOT Obstruction Mitral regurgitation Hx of stress cardiomyopathy - I suspect this is more from deconditioning but given her persistent sx will check for inducible LVOT gradient and exercise associated MR, may start diuretic of change BB based on results - continue norvasc and atenolol  Palpitations - unchanged despite normal heart monitor  Six months with me  Medication Adjustments/Labs and Tests Ordered: Current medicines are reviewed at length with the patient today.  Concerns regarding medicines are outlined above.  Orders Placed This Encounter  Procedures   Cardiac Stress Test: Informed Consent Details: Physician/Practitioner Attestation; Transcribe to consent form and obtain patient signature   ECHOCARDIOGRAM STRESS TEST   No orders of the defined types were placed in this encounter.   Patient Instructions  Medication Instructions:  Your physician recommends that you continue on your current medications as directed. Please refer to the Current Medication list given to you today.  *If you need a refill on your cardiac medications before your next appointment, please call your pharmacy*   Lab Work: NONE If you have labs (blood work) drawn today and your tests are completely normal, you will receive  your results only by: [place] (if you have MyChart) OR A paper copy in the mail If you have any lab test that is abnormal or we need to change your treatment, we will call you to review the results.   Testing/Procedures: Your physician has requested that you have a stress echocardiogram. For further information please visit [email or web address]. Please follow instruction sheet as given.    Follow-Up: At [facility], you and your health needs are our priority.  As part of our continuing mission to provide you with exceptional heart care, we have created designated Provider Care Teams.  These Care Teams include your primary Cardiologist (physician) and Advanced Practice Providers (APPs -  Physician Assistants and Nurse Practitioners) who all work together to provide you with the care you need, when you need it.  We recommend signing up for the patient portal called "MyChart".  Sign up information is provided on this After Visit Summary.  MyChart is used to connect with patients for Virtual Visits (Telemedicine).  Patients are able to view lab/test results, encounter notes, upcoming appointments, etc.  Non-urgent messages can be sent to your provider as well.   To learn more about what you can do with MyChart, go to [email or web address].    Your next appointment:   6 month(s)  The format for your next appointment:   In Person  Provider:   Rudean Haskell, MD   Important Information About Sugar         Signed, Werner Lean, MD  10/07/2022 10:42 AM  [facility]

## 2022-10-07 ENCOUNTER — Encounter: Payer: Self-pay | Admitting: Internal Medicine

## 2022-10-07 ENCOUNTER — Ambulatory Visit: Payer: Medicare Other | Attending: Internal Medicine | Admitting: Internal Medicine

## 2022-10-07 VITALS — BP 130/68 | HR 71 | Ht 63.0 in | Wt 153.0 lb

## 2022-10-07 DIAGNOSIS — R0609 Other forms of dyspnea: Secondary | ICD-10-CM | POA: Insufficient documentation

## 2022-10-07 DIAGNOSIS — I340 Nonrheumatic mitral (valve) insufficiency: Secondary | ICD-10-CM | POA: Diagnosis not present

## 2022-10-07 DIAGNOSIS — R002 Palpitations: Secondary | ICD-10-CM | POA: Diagnosis not present

## 2022-10-07 DIAGNOSIS — I517 Cardiomegaly: Secondary | ICD-10-CM | POA: Diagnosis not present

## 2022-10-07 NOTE — Patient Instructions (Signed)
Medication Instructions:  Your physician recommends that you continue on your current medications as directed. Please refer to the Current Medication list given to you today.  *If you need a refill on your cardiac medications before your next appointment, please call your pharmacy*   Lab Work: NONE If you have labs (blood work) drawn today and your tests are completely normal, you will receive your results only by: MyChart Message (if you have MyChart) OR A paper copy in the mail If you have any lab test that is abnormal or we need to change your treatment, we will call you to review the results.   Testing/Procedures: Your physician has requested that you have a stress echocardiogram. For further information please visit [email or web address] Please follow instruction sheet as given.    Follow-Up: At [facility], you and your health needs are our priority.  As part of our continuing mission to provide you with exceptional heart care, we have created designated Provider Care Teams.  These Care Teams include your primary Cardiologist (physician) and Advanced Practice Providers (APPs -  Physician Assistants and Nurse Practitioners) who all work together to provide you with the care you need, when you need it.  We recommend signing up for the patient portal called "MyChart".  Sign up information is provided on this After Visit Summary.  MyChart is used to connect with patients for Virtual Visits (Telemedicine).  Patients are able to view lab/test results, encounter notes, upcoming appointments, etc.  Non-urgent messages can be sent to your provider as well.   To learn more about what you can do with MyChart, go to [email or web address].    Your next appointment:   6 month(s)  The format for your next appointment:   In Person  Provider:   Riley Lam, MD   Important Information About Sugar

## 2022-10-21 ENCOUNTER — Telehealth (HOSPITAL_COMMUNITY): Payer: Self-pay | Admitting: *Deleted

## 2022-10-21 ENCOUNTER — Encounter (HOSPITAL_COMMUNITY): Payer: Self-pay | Admitting: *Deleted

## 2022-10-21 NOTE — Telephone Encounter (Signed)
Instructions sent via my chart per patient request for upcoming stress echo.  Sharon Munoz

## 2022-10-28 ENCOUNTER — Ambulatory Visit (HOSPITAL_COMMUNITY): Payer: Medicare Other

## 2022-10-28 ENCOUNTER — Ambulatory Visit (HOSPITAL_COMMUNITY): Payer: Medicare Other | Attending: Internal Medicine

## 2022-10-28 DIAGNOSIS — I340 Nonrheumatic mitral (valve) insufficiency: Secondary | ICD-10-CM | POA: Insufficient documentation

## 2022-11-20 ENCOUNTER — Other Ambulatory Visit: Payer: Self-pay | Admitting: General Practice

## 2022-11-20 DIAGNOSIS — R42 Dizziness and giddiness: Secondary | ICD-10-CM

## 2022-11-20 DIAGNOSIS — E785 Hyperlipidemia, unspecified: Secondary | ICD-10-CM

## 2022-11-20 DIAGNOSIS — R0789 Other chest pain: Secondary | ICD-10-CM

## 2022-11-20 DIAGNOSIS — I10 Essential (primary) hypertension: Secondary | ICD-10-CM

## 2022-12-13 ENCOUNTER — Other Ambulatory Visit: Payer: Self-pay | Admitting: General Practice

## 2022-12-13 DIAGNOSIS — E785 Hyperlipidemia, unspecified: Secondary | ICD-10-CM

## 2022-12-13 DIAGNOSIS — R42 Dizziness and giddiness: Secondary | ICD-10-CM

## 2022-12-13 DIAGNOSIS — I10 Essential (primary) hypertension: Secondary | ICD-10-CM

## 2022-12-13 DIAGNOSIS — R0789 Other chest pain: Secondary | ICD-10-CM

## 2022-12-27 ENCOUNTER — Ambulatory Visit
Admission: EM | Admit: 2022-12-27 | Discharge: 2022-12-27 | Disposition: A | Discharge status: Home / Self Care | Payer: Medicare Other | Attending: Internal Medicine | Admitting: Internal Medicine

## 2022-12-27 DIAGNOSIS — M65332 Trigger finger, left middle finger: Secondary | ICD-10-CM | POA: Insufficient documentation

## 2022-12-27 DIAGNOSIS — Z1152 Encounter for screening for COVID-19: Secondary | ICD-10-CM | POA: Insufficient documentation

## 2022-12-27 DIAGNOSIS — Z7984 Long term (current) use of oral hypoglycemic drugs: Secondary | ICD-10-CM | POA: Insufficient documentation

## 2022-12-27 DIAGNOSIS — J069 Acute upper respiratory infection, unspecified: Secondary | ICD-10-CM | POA: Diagnosis present

## 2022-12-27 DIAGNOSIS — Z794 Long term (current) use of insulin: Secondary | ICD-10-CM | POA: Insufficient documentation

## 2022-12-27 DIAGNOSIS — E119 Type 2 diabetes mellitus without complications: Secondary | ICD-10-CM | POA: Insufficient documentation

## 2022-12-27 MED ORDER — CELECOXIB 200 MG PO CAPS: 200.0000 mg | ORAL_CAPSULE | Freq: Every day | ORAL | 0 refills | Status: DC | PRN

## 2022-12-27 MED ORDER — FLUTICASONE PROPIONATE 50 MCG/ACT NA SUSP: 1.0000 | Freq: Every day | NASAL | 0 refills | Status: AC

## 2022-12-27 MED ORDER — BENZONATATE 100 MG PO CAPS: 100.0000 mg | ORAL_CAPSULE | Freq: Three times a day (TID) | ORAL | 0 refills | Status: DC | PRN

## 2022-12-27 NOTE — ED Provider Notes (Signed)
[name]    CSN: [id number] Arrival date & time: 12/27/22  1508      History   Chief Complaint Chief Complaint  Patient presents with   Cough    HPI Sharon Munoz is a 67 y.o. female.   Patient presents with 2 different chief complaints today.  Patient reports cough and nasal congestion that started about 3 days ago.  Patient denies any fever at home.  She reports known sick contact in the household with similar symptoms.  Patient denies sore throat, chest pain, shortness of breath, nausea, vomiting, diarrhea, abdominal pain.  Denies history of asthma or COPD and patient does not smoke cigarettes.  Has taken over-the-counter medication with minimal improvement in symptoms.  Also reporting concern for trigger finger to middle finger of left hand.  Reports it started a few days ago.  She denies any injury to the area.  States she has had trigger finger of the right hand in the past and was followed by Ortho.  She states that she was prescribed medication at urgent care that greatly improved pain and discomfort.  She was requesting a refill on this.  Denies numbness or tingling.  She reports clicking sensation of the finger.   Cough   Past Medical History:  Diagnosis Date   Back spasm    Diabetes mellitus    Hypertension    Systolic ejection murmur 0962    Patient Active Problem List   Diagnosis Date Noted   DOE (dyspnea on exertion) 10/07/2022   Nonrheumatic mitral valve regurgitation 10/07/2022   LVH (left ventricular hypertrophy) 10/07/2022   Asymmetric septal hypertrophy ([place]) 04/23/2022   Cardiomyopathy ([place]) 03/20/2020   Chest pain 03/08/2020   Back muscle spasm 03/08/2020   Elevated troponin    Palpitations 05/10/2018   Bilateral hand pain 02/12/2016   H. pylori infection 01/29/2016   Chronic urticaria 12/10/2015   Angioedema 12/10/2015   Chronic rhinitis 12/10/2015   WRIST PAIN, RIGHT 03/05/2009   HYPOMAGNESEMIA 10/05/2008   CATARACTS,  BILATERAL 08/24/2008   Cardiac murmur 08/24/2008   CARPAL TUNNEL SYNDROME, BILATERAL 08/23/2008   Dyslipidemia 10/20/2007   HYPOKALEMIA 10/20/2007   OTHER ACUTE REACTIONS TO STRESS 10/04/2007   GERD 06/07/2007   POSTMENOPAUSAL STATUS 11/30/2006   Diabetes mellitus ([place]) 11/30/2005   Essential hypertension 11/30/2002    Past Surgical History:  Procedure Laterality Date   CESAREAN SECTION     RIGHT/LEFT HEART CATH AND CORONARY ANGIOGRAPHY N/A 03/08/2020   Procedure: RIGHT/LEFT HEART CATH AND CORONARY ANGIOGRAPHY;  Surgeon: Nelva Bush, MD;  Location: [facility];  Service: Cardiovascular;  Laterality: N/A;    OB History   No obstetric history on file.      Home Medications    Prior to Admission medications   Medication Sig Start Date End Date Taking? Authorizing Provider  benzonatate (TESSALON) 100 MG capsule Take 1 capsule (100 mg total) by mouth every 8 (eight) hours as needed for cough. 12/27/22  Yes Teah Votaw, Hildred Alamin E, FNP  celecoxib (CELEBREX) 200 MG capsule Take 1 capsule (200 mg total) by mouth daily as needed. 12/27/22  Yes Rache Klimaszewski, Hildred Alamin E, FNP  fluticasone (FLONASE) 50 MCG/ACT nasal spray Place 1 spray into both nostrils daily. 12/27/22  Yes Nastassia Bazaldua, Hildred Alamin E, FNP  acetaminophen (TYLENOL) 500 MG tablet Take 1,000 mg by mouth every 6 (six) hours as needed for mild pain.    [provider]  amLODipine (NORVASC) 10 MG tablet Take 1 tablet (10 mg total) by  mouth daily. 10/21/21   Deberah Pelton, NP  Ascorbic Acid (VITAMIN C) 500 MG CHEW daily at 6 (six) AM.    [provider]  aspirin EC 81 MG tablet Take 81 mg by mouth daily.    [provider]  atenolol (TENORMIN) 50 MG tablet Take 1 tablet (50 mg total) by mouth 2 (two) times daily. 10/21/21   Deberah Pelton, NP  Calcium Carb-Cholecalciferol (CALCIUM 500 + D3) 500-15 MG-MCG TABS 2 (two) times a week.    [provider]  Cholecalciferol (VITAMIN D3 PO) Take 1 capsule by mouth 4 (four)  times a week. [employer] Brand    [provider]  empagliflozin (JARDIANCE) 25 MG TABS tablet Take 1 tablet (25 mg total) by mouth daily. 11/20/22   Deberah Pelton, NP  escitalopram (LEXAPRO) 20 MG tablet Take 1 tablet (20 mg total) by mouth daily. 03/09/20 10/07/22  Nita Sells, MD  gabapentin (NEURONTIN) 100 MG capsule Take 3 capsules (300 mg total) by mouth 3 (three) times daily. Patient taking differently: Take 300 mg by mouth 2 (two) times daily. 02/12/16   Marcial Pacas, MD  ibandronate (BONIVA) 150 MG tablet every 30 (thirty) days.    [provider]  KLOR-CON M10 10 MEQ tablet Take 10 mEq by mouth 3 (three) times a week. For 30 days 02/11/22   [provider]  levocetirizine (XYZAL) 5 MG tablet as needed for allergies.    [provider]  Magnesium 250 MG TABS daily.    [provider]  Multiple Vitamins-Minerals (HAIR SKIN NAILS PO) Take 1 capsule by mouth 3 (three) times a week.    [provider]  Omega-3 Fatty Acids (FISH OIL) 1000 MG CAPS Take 1,000 mg by mouth 2 (two) times a week.    [provider]  pravastatin (PRAVACHOL) 20 MG tablet TAKE 1 TABLET BY MOUTH EVERY DAY 12/14/22   Cleaver, Jossie Ng, NP  TRESIBA FLEXTOUCH 100 UNIT/ML FlexTouch Pen Inject 36 Units into the skin daily. 06/20/20   [provider]    Family History Family History  Problem Relation Age of Onset   Asthma Father    Hypertension Father    Kidney cancer Father    CVA Father 34   Asthma Brother    Colon cancer Brother 37   Hypertension Mother    Eczema Neg Hx    Immunodeficiency Neg Hx    Urticaria Neg Hx     Social History Social History   Tobacco Use   Smoking status: Never   Smokeless tobacco: Never  Substance Use Topics   Alcohol use: No    Alcohol/week: 0.0 standard drinks of alcohol   Drug use: Never     Allergies   Ace inhibitors, Citrus, Codeine, Lisinopril, Other, Percocet [oxycodone-acetaminophen],  Propoxyphene, and Januvia [sitagliptin]   Review of Systems Review of Systems Per HPI  Physical Exam Triage Vital Signs ED Triage Vitals  Enc Vitals Group     BP 12/27/22 1536 127/75     Pulse Rate 12/27/22 1536 70     Resp 12/27/22 1536 16     Temp 12/27/22 1536 98.6 F (37 C)     Temp Source 12/27/22 1536 Oral     SpO2 12/27/22 1536 96 %     Weight --      Height --      Head Circumference --      Peak Flow --      Pain Score 12/27/22 1537  6     Pain Loc --      Pain Edu? --      Excl. in [place]? --    No data found.  Updated Vital Signs BP 127/75 (BP Location: Left Arm)   Pulse 70   Temp 98.6 F (37 C) (Oral)   Resp 16   SpO2 96%   Visual Acuity Right Eye Distance:   Left Eye Distance:   Bilateral Distance:    Right Eye Near:   Left Eye Near:    Bilateral Near:     Physical Exam Constitutional:      General: She is not in acute distress.    Appearance: Normal appearance. She is not toxic-appearing or diaphoretic.  HENT:     Head: Normocephalic and atraumatic.     Right Ear: Tympanic membrane and ear canal normal.     Left Ear: Tympanic membrane and ear canal normal.     Nose: Congestion present.     Mouth/Throat:     Mouth: Mucous membranes are moist.     Pharynx: No posterior oropharyngeal erythema.  Eyes:     Extraocular Movements: Extraocular movements intact.     Conjunctiva/sclera: Conjunctivae normal.     Pupils: Pupils are equal, round, and reactive to light.  Cardiovascular:     Rate and Rhythm: Normal rate and regular rhythm.     Pulses: Normal pulses.     Heart sounds: Normal heart sounds.  Pulmonary:     Effort: Pulmonary effort is normal. No respiratory distress.     Breath sounds: Normal breath sounds. No stridor. No wheezing, rhonchi or rales.  Abdominal:     General: Abdomen is flat. Bowel sounds are normal.     Palpations: Abdomen is soft.  Musculoskeletal:        General: Normal range of motion.     Cervical back: Normal range  of motion.     Comments: Tenderness to palpation PIP joint of left middle finger.  No obvious swelling.  Patient has limited range of motion and a "clicking sensation" with movement.  Capillary refill and pulses intact.  Skin:    General: Skin is warm and dry.  Neurological:     General: No focal deficit present.     Mental Status: She is alert and oriented to person, place, and time. Mental status is at baseline.  Psychiatric:        Mood and Affect: Mood normal.        Behavior: Behavior normal.        Thought Content: Thought content normal.        Judgment: Judgment normal.      UC Treatments / Results  Labs (all labs ordered are listed, but only abnormal results are displayed) Labs Reviewed  SARS CORONAVIRUS 2 (TAT 6-24 HRS)    EKG   Radiology No results found.  Procedures Procedures (including critical care time)  Medications Ordered in UC Medications - No data to display  Initial Impression / Assessment and Plan / UC Course  I have reviewed the triage vital signs and the nursing notes.  Pertinent labs & imaging results that were available during my care of the patient were reviewed by me and considered in my medical decision making (see chart for details).     Patient presents with symptoms likely from a viral upper respiratory infection. Do not suspect underlying cardiopulmonary process. Symptoms seem unlikely related to ACS, CHF or COPD exacerbations, pneumonia, pneumothorax. Patient is nontoxic appearing  and not in need of emergent medical intervention.  COVID test pending.  Patient declined blood work to check kidney function for COVID antivirals if positive. Recommended symptom control with medications and supportive care.  Patient sent prescriptions.  Suspect trigger finger to affected finger.  Patient already has splint and encouraged her to continue this.  Will prescribe Celebrex as patient was previously prescribed this for trigger finger of right hand and  it provided improvement.  Kidney function appears normal and no other contraindications to NSAIDs noted in patient's history.  Patient to follow-up with established orthopedist for further evaluation and management of this.  Return if symptoms fail to improve in 1-2 weeks or you develop shortness of breath, chest pain, severe headache. Patient states understanding and is agreeable.  Discharged with PCP followup.  Final Clinical Impressions(s) / UC Diagnoses   Final diagnoses:  Viral upper respiratory tract infection with cough  Trigger middle finger of left hand     Discharge Instructions      It appears that you have a viral that should run its course and self resolve with symptomatic treatment.  COVID test is pending.  Will call if it is positive.  I have prescribed you 2 medications to help alleviate symptoms.  You have trigger finger.  keep splint on.  Follow-up with Ortho.  I have prescribed a medication to alleviate inflammation and pain.  Take this medication with food.    ED Prescriptions     Medication Sig Dispense Auth. Provider   fluticasone (FLONASE) 50 MCG/ACT nasal spray Place 1 spray into both nostrils daily. 16 g Janeene Sand, Rolly Salter E, [place]   benzonatate (TESSALON) 100 MG capsule Take 1 capsule (100 mg total) by mouth every 8 (eight) hours as needed for cough. 21 capsule [place], Kinder E, [place]   celecoxib (CELEBREX) 200 MG capsule Take 1 capsule (200 mg total) by mouth daily as needed. 30 capsule [place], Acie Fredrickson, [place]      PDMP not reviewed this encounter.   Gustavus Bryant, [place] 12/27/22 [phone]

## 2022-12-27 NOTE — ED Triage Notes (Signed)
Pt c/o cough, nasal congestion  Also c/o left 3rd digit trigger finger exacerbation that was treated here before and said the prescription was helpful.

## 2022-12-27 NOTE — Discharge Instructions (Signed)
It appears that you have a viral that should run its course and self resolve with symptomatic treatment.  COVID test is pending.  Will call if it is positive.  I have prescribed you 2 medications to help alleviate symptoms.  You have trigger finger.  keep splint on.  Follow-up with Ortho.  I have prescribed a medication to alleviate inflammation and pain.  Take this medication with food.

## 2022-12-28 LAB — SARS CORONAVIRUS 2 (TAT 6-24 HRS): SARS Coronavirus 2: POSITIVE — AB

## 2023-02-07 ENCOUNTER — Ambulatory Visit
Admission: RE | Admit: 2023-02-07 | Discharge: 2023-02-07 | Disposition: A | Discharge status: Home / Self Care | Payer: Medicare Other | Source: Ambulatory Visit | Attending: Internal Medicine | Admitting: Internal Medicine

## 2023-02-07 VITALS — BP 159/83 | HR 66 | Temp 97.9°F | Resp 18 | Ht 63.0 in | Wt 148.0 lb

## 2023-02-07 DIAGNOSIS — R21 Rash and other nonspecific skin eruption: Secondary | ICD-10-CM

## 2023-02-07 MED ORDER — TRIAMCINOLONE ACETONIDE 0.1 % EX CREA
1.0000 | TOPICAL_CREAM | Freq: Two times a day (BID) | CUTANEOUS | 0 refills | Status: DC
Start: 2023-02-07 — End: 2023-04-21

## 2023-02-07 MED ORDER — HYDROCORTISONE 0.5 % EX CREA: 1.0000 | TOPICAL_CREAM | Freq: Two times a day (BID) | CUTANEOUS | 0 refills | Status: DC

## 2023-02-07 NOTE — Discharge Instructions (Addendum)
I have prescribed you a cream to apply directly to the areas.  Do not use triamcinolone cream on your face.  Wash all of your clothes and linen in warm water.  Follow-up if any symptoms persist or worsen.

## 2023-02-07 NOTE — ED Provider Notes (Signed)
[name]    CSN: [id number] Arrival date & time: 02/07/23  1121      History   Chief Complaint Chief Complaint  Patient presents with   Rash    HPI Sharon Munoz is a 67 y.o. female.   Patient presents with rash on left arm, left upper back, left side of face that has been present for a few days.  Patient reports that is very itchy.  She denies any changes to lotions, soaps, detergents, foods, etc.  She does report that she has been staying over at someone else's house recently.  Has been using Benadryl cream, taking Zyrtec, and hydrocortisone cream with no improvement.  Denies feelings of throat closing or shortness of breath.  Blood pressure is elevated but patient denies that she has taken her blood pressure medication today.  Patient not reporting chest pain, shortness of breath, headache, dizziness, shortness of breath, nausea, vomiting.   Rash   Past Medical History:  Diagnosis Date   Back spasm    Diabetes mellitus    Hypertension    Systolic ejection murmur [id number]    Patient Active Problem List   Diagnosis Date Noted   DOE (dyspnea on exertion) 10/07/2022   Nonrheumatic mitral valve regurgitation 10/07/2022   LVH (left ventricular hypertrophy) 10/07/2022   Asymmetric septal hypertrophy ([place]) 04/23/2022   Cardiomyopathy ([place]) 03/20/2020   Chest pain 03/08/2020   Back muscle spasm 03/08/2020   Elevated troponin    Palpitations 05/10/2018   Bilateral hand pain 02/12/2016   H. pylori infection 01/29/2016   Chronic urticaria 12/10/2015   Angioedema 12/10/2015   Chronic rhinitis 12/10/2015   WRIST PAIN, RIGHT 03/05/2009   HYPOMAGNESEMIA 10/05/2008   CATARACTS, BILATERAL 08/24/2008   Cardiac murmur 08/24/2008   CARPAL TUNNEL SYNDROME, BILATERAL 08/23/2008   Dyslipidemia 10/20/2007   HYPOKALEMIA 10/20/2007   OTHER ACUTE REACTIONS TO STRESS 10/04/2007   GERD 06/07/2007   POSTMENOPAUSAL STATUS 11/30/2006   Diabetes mellitus ([place]) 11/30/2005    Essential hypertension 11/30/2002    Past Surgical History:  Procedure Laterality Date   CESAREAN SECTION     RIGHT/LEFT HEART CATH AND CORONARY ANGIOGRAPHY N/A 03/08/2020   Procedure: RIGHT/LEFT HEART CATH AND CORONARY ANGIOGRAPHY;  Surgeon: Nelva Bush, MD;  Location: [facility];  Service: Cardiovascular;  Laterality: N/A;    OB History   No obstetric history on file.      Home Medications    Prior to Admission medications   Medication Sig Start Date End Date Taking? Authorizing Provider  acetaminophen (TYLENOL) 500 MG tablet Take 1,000 mg by mouth every 6 (six) hours as needed for mild pain.   Yes [provider]  amLODipine (NORVASC) 10 MG tablet Take 1 tablet (10 mg total) by mouth daily. 10/21/21  Yes Cleaver, Jossie Ng, NP  Ascorbic Acid (VITAMIN C) 500 MG CHEW daily at 6 (six) AM.   Yes [provider]  aspirin EC 81 MG tablet Take 81 mg by mouth daily.   Yes [provider]  atenolol (TENORMIN) 50 MG tablet Take 1 tablet (50 mg total) by mouth 2 (two) times daily. 10/21/21  Yes Cleaver, Jossie Ng, NP  benzonatate (TESSALON) 100 MG capsule Take 1 capsule (100 mg total) by mouth every 8 (eight) hours as needed for cough. 12/27/22  Yes Tylisha Danis, Hildred Alamin E, FNP  Calcium Carb-Cholecalciferol (CALCIUM 500 + D3) 500-15 MG-MCG TABS 2 (two) times a week.   Yes [provider]  celecoxib (CELEBREX) 200 MG  capsule Take 1 capsule (200 mg total) by mouth daily as needed. 12/27/22  Yes Briann Sarchet, Hildred Alamin E, FNP  Cholecalciferol (VITAMIN D3 PO) Take 1 capsule by mouth 4 (four) times a week. [employer] Brand   Yes [provider]  empagliflozin (JARDIANCE) 25 MG TABS tablet Take 1 tablet (25 mg total) by mouth daily. 11/20/22  Yes Cleaver, Jossie Ng, NP  fluticasone (FLONASE) 50 MCG/ACT nasal spray Place 1 spray into both nostrils daily. 12/27/22  Yes Blayne Garlick, Hildred Alamin E, FNP  gabapentin (NEURONTIN) 100 MG capsule Take 3 capsules (300 mg total) by mouth 3  (three) times daily. Patient taking differently: Take 300 mg by mouth 2 (two) times daily. 02/12/16  Yes Marcial Pacas, MD  hydrocortisone cream 0.5 % Apply 1 Application topically 2 (two) times daily. Apply to area on face.  Do not get in your eyes. 02/07/23  Yes Axxel Gude, Hildred Alamin E, FNP  ibandronate (BONIVA) 150 MG tablet every 30 (thirty) days.   Yes [provider]  KLOR-CON M10 10 MEQ tablet Take 10 mEq by mouth 3 (three) times a week. For 30 days 02/11/22  Yes [provider]  levocetirizine (XYZAL) 5 MG tablet as needed for allergies.   Yes [provider]  Magnesium 250 MG TABS daily.   Yes [provider]  Multiple Vitamins-Minerals (HAIR SKIN NAILS PO) Take 1 capsule by mouth 3 (three) times a week.   Yes [provider]  Omega-3 Fatty Acids (FISH OIL) 1000 MG CAPS Take 1,000 mg by mouth 2 (two) times a week.   Yes [provider]  pravastatin (PRAVACHOL) 20 MG tablet TAKE 1 TABLET BY MOUTH EVERY DAY 12/14/22  Yes Cleaver, Jossie Ng, NP  TRESIBA FLEXTOUCH 100 UNIT/ML FlexTouch Pen Inject 36 Units into the skin daily. 06/20/20  Yes [provider]  triamcinolone cream (KENALOG) 0.1 % Apply 1 Application topically 2 (two) times daily. Apply to affected areas.  Do not apply to face. 02/07/23  Yes Bastion Bolger, Hildred Alamin E, FNP  escitalopram (LEXAPRO) 20 MG tablet Take 1 tablet (20 mg total) by mouth daily. 03/09/20 10/07/22  Nita Sells, MD    Family History Family History  Problem Relation Age of Onset   Hypertension Mother    Asthma Father    Hypertension Father    Kidney cancer Father    CVA Father 32   Asthma Brother    Colon cancer Brother 30   Eczema Neg Hx    Immunodeficiency Neg Hx    Urticaria Neg Hx     Social History Social History   Tobacco Use   Smoking status: Never   Smokeless tobacco: Never  Substance Use Topics   Alcohol use: No    Alcohol/week: 0.0 standard drinks of alcohol   Drug use: Never     Allergies    Ace inhibitors, Citrus, Codeine, Lisinopril, Other, Percocet [oxycodone-acetaminophen], Propoxyphene, and Januvia [sitagliptin]   Review of Systems Review of Systems Per HPI  Physical Exam Triage Vital Signs ED Triage Vitals  Enc Vitals Group     BP 02/07/23 1237 (!) 180/79     Pulse Rate 02/07/23 1237 66     Resp 02/07/23 1237 18     Temp 02/07/23 1237 97.9 F (36.6 C)     Temp Source 02/07/23 1237 Oral     SpO2 02/07/23 1237 95 %     Weight 02/07/23 1239 148 lb (67.1 kg)     Height 02/07/23 1239 '5\' 3"'$  (1.6 m)  Head Circumference --      Peak Flow --      Pain Score 02/07/23 1239 0     Pain Loc --      Pain Edu? --      Excl. in [place]? --    No data found.  Updated Vital Signs BP (!) 159/83 (BP Location: Left Arm)   Pulse 66   Temp 97.9 F (36.6 C) (Oral)   Resp 18   Ht '5\' 3"'$  (1.6 m)   Wt 148 lb (67.1 kg)   SpO2 95%   BMI 26.22 kg/m   Visual Acuity Right Eye Distance:   Left Eye Distance:   Bilateral Distance:    Right Eye Near:   Left Eye Near:    Bilateral Near:     Physical Exam Constitutional:      General: She is not in acute distress.    Appearance: Normal appearance. She is not toxic-appearing or diaphoretic.  HENT:     Head: Normocephalic and atraumatic.  Eyes:     Extraocular Movements: Extraocular movements intact.     Conjunctiva/sclera: Conjunctivae normal.  Pulmonary:     Effort: Pulmonary effort is normal.  Skin:    Comments: Has scattered areas of raised areas of erythema to left forearm, left upper back, left lateral face..  No purulent drainage noted.  Neurological:     General: No focal deficit present.     Mental Status: She is alert and oriented to person, place, and time. Mental status is at baseline.  Psychiatric:        Mood and Affect: Mood normal.        Behavior: Behavior normal.        Thought Content: Thought content normal.        Judgment: Judgment normal.      UC Treatments / Results  Labs (all labs ordered  are listed, but only abnormal results are displayed) Labs Reviewed - No data to display  EKG   Radiology No results found.  Procedures Procedures (including critical care time)  Medications Ordered in UC Medications - No data to display  Initial Impression / Assessment and Plan / UC Course  I have reviewed the triage vital signs and the nursing notes.  Pertinent labs & imaging results that were available during my care of the patient were reviewed by me and considered in my medical decision making (see chart for details).     Rash is consistent with possible insect bites.  No obvious signs of allergic reaction, bacterial infection, fungal infection.  Will treat areas on arms and back with triamcinolone cream.  Will use hydrocortisone cream on the face.  Advised patient to not use triamcinolone cream on the face.  Advised to continue antihistamine as needed.  Advised strict follow-up precautions and monitoring for signs of infection.  Blood pressure is elevated but patient reports she has not taken her medication today.  Recheck was much improved.  Advised her to take her BP medication as soon as possible. She is asymptomatic regarding blood pressure so do not think that emergent evaluation is necessary.  Patient advised to follow-up with PCP or urgent care if it remains elevated.  Patient verbalized understanding and was agreeable with plan. Final Clinical Impressions(s) / UC Diagnoses   Final diagnoses:  Rash and nonspecific skin eruption     Discharge Instructions      I have prescribed you a cream to apply directly to the areas.  Do not use  triamcinolone cream on your face.  Wash all of your clothes and linen in warm water.  Follow-up if any symptoms persist or worsen.    ED Prescriptions     Medication Sig Dispense Auth. Provider   triamcinolone cream (KENALOG) 0.1 % Apply 1 Application topically 2 (two) times daily. Apply to affected areas.  Do not apply to face. 30 g  Oswaldo Conroy E, [place]   hydrocortisone cream 0.5 % Apply 1 Application topically 2 (two) times daily. Apply to area on face.  Do not get in your eyes. 30 g Teodora Medici, [place]      PDMP not reviewed this encounter.   Teodora Medici,  02/07/23 1344

## 2023-02-07 NOTE — ED Triage Notes (Signed)
Patient c/o hives on arms, face, neck and back x 2 days.  Patient unsure if it's her new medication.  Patient has been using new shower gel.  Patient has been taken Benadryl cream and tablets.

## 2023-03-16 ENCOUNTER — Ambulatory Visit (HOSPITAL_COMMUNITY): Payer: Medicare Other

## 2023-03-17 ENCOUNTER — Ambulatory Visit (HOSPITAL_COMMUNITY)
Admission: RE | Admit: 2023-03-17 | Discharge: 2023-03-17 | Disposition: A | Discharge status: Home / Self Care | Payer: Medicare Other | Source: Ambulatory Visit | Attending: Cardiovascular Disease | Admitting: Cardiovascular Disease

## 2023-03-17 DIAGNOSIS — E119 Type 2 diabetes mellitus without complications: Secondary | ICD-10-CM | POA: Diagnosis not present

## 2023-03-17 DIAGNOSIS — I10 Essential (primary) hypertension: Secondary | ICD-10-CM | POA: Diagnosis present

## 2023-03-17 DIAGNOSIS — E785 Hyperlipidemia, unspecified: Secondary | ICD-10-CM | POA: Diagnosis not present

## 2023-03-17 DIAGNOSIS — I080 Rheumatic disorders of both mitral and aortic valves: Secondary | ICD-10-CM | POA: Diagnosis not present

## 2023-03-17 NOTE — Progress Notes (Signed)
  Echocardiogram 2D Echocardiogram has been performed.  Augustine Radar 03/17/2023, 4:17 PM

## 2023-03-22 LAB — ECHOCARDIOGRAM COMPLETE
AV Vena cont: 0.2 cm
Area-P 1/2: 2.82 cm2
Calc EF: 69.4 %
P 1/2 time: 489 msec
S' Lateral: 2.4 cm
Single Plane A2C EF: 67.2 %
Single Plane A4C EF: 69.3 %

## 2023-03-28 ENCOUNTER — Ambulatory Visit
Admission: EM | Admit: 2023-03-28 | Discharge: 2023-03-28 | Disposition: A | Discharge status: Home / Self Care | Payer: Medicare Other | Attending: Internal Medicine | Admitting: Internal Medicine

## 2023-03-28 DIAGNOSIS — H1033 Unspecified acute conjunctivitis, bilateral: Secondary | ICD-10-CM | POA: Diagnosis not present

## 2023-03-28 MED ORDER — ERYTHROMYCIN 5 MG/GM OP OINT: TOPICAL_OINTMENT | Freq: Two times a day (BID) | OPHTHALMIC | 0 refills | Status: AC

## 2023-03-28 MED ORDER — OLOPATADINE HCL 0.1 % OP SOLN: 1.0000 [drp] | Freq: Two times a day (BID) | OPHTHALMIC | 0 refills | Status: AC

## 2023-03-28 NOTE — ED Triage Notes (Signed)
Pt presents today with a scratchy throat that she said started this morning and eye pain that started last night. She stated that her eyes are runny and have mucus in them.

## 2023-03-28 NOTE — Discharge Instructions (Signed)
Please use eyedrops as directed Wipe eye discharge with warm wet rag Please return to urgent care if you have worsening symptoms.

## 2023-03-30 NOTE — ED Provider Notes (Signed)
[name]    CSN: [id number] Arrival date & time: 03/28/23  1348      History   Chief Complaint Chief Complaint  Patient presents with   Eye Pain   Sore Throat    HPI Sharon Munoz is a 67 y.o. female comes to the urgent care with 1 day history of bilateral eye pain, yellowish eye discharge and sore throat.  No fever or chills.  No nausea, vomiting or diarrhea.  No sick contacts.  No shortness of breath or wheezing.  Patient complains of itchy eyes and nose.  No nausea or vomiting.  HPI  Past Medical History:  Diagnosis Date   Back spasm    Diabetes mellitus    Hypertension    Systolic ejection murmur 1986    Patient Active Problem List   Diagnosis Date Noted   DOE (dyspnea on exertion) 10/07/2022   Nonrheumatic mitral valve regurgitation 10/07/2022   LVH (left ventricular hypertrophy) 10/07/2022   Asymmetric septal hypertrophy (HCC) 04/23/2022   Cardiomyopathy (HCC) 03/20/2020   Chest pain 03/08/2020   Back muscle spasm 03/08/2020   Elevated troponin    Palpitations 05/10/2018   Bilateral hand pain 02/12/2016   H. pylori infection 01/29/2016   Chronic urticaria 12/10/2015   Angioedema 12/10/2015   Chronic rhinitis 12/10/2015   WRIST PAIN, RIGHT 03/05/2009   HYPOMAGNESEMIA 10/05/2008   CATARACTS, BILATERAL 08/24/2008   Cardiac murmur 08/24/2008   CARPAL TUNNEL SYNDROME, BILATERAL 08/23/2008   Dyslipidemia 10/20/2007   HYPOKALEMIA 10/20/2007   OTHER ACUTE REACTIONS TO STRESS 10/04/2007   GERD 06/07/2007   POSTMENOPAUSAL STATUS 11/30/2006   Diabetes mellitus (HCC) 11/30/2005   Essential hypertension 11/30/2002    Past Surgical History:  Procedure Laterality Date   CESAREAN SECTION     RIGHT/LEFT HEART CATH AND CORONARY ANGIOGRAPHY N/A 03/08/2020   Procedure: RIGHT/LEFT HEART CATH AND CORONARY ANGIOGRAPHY;  Surgeon: Yvonne Kendall, MD;  Location: [facility];  Service: Cardiovascular;  Laterality: N/A;    OB History   No  obstetric history on file.      Home Medications    Prior to Admission medications   Medication Sig Start Date End Date Taking? Authorizing Provider  erythromycin ophthalmic ointment Place into both eyes 2 (two) times daily for 5 days. Place a 1/2 inch ribbon of ointment into the lower eyelid. 03/28/23 04/02/23 Yes Waverly Chavarria, Britta Mccreedy, MD  olopatadine (PATANOL) 0.1 % ophthalmic solution Place 1 drop into both eyes 2 (two) times daily for 7 days. 03/28/23 04/04/23 Yes Idalys Konecny, Britta Mccreedy, MD  acetaminophen (TYLENOL) 500 MG tablet Take 1,000 mg by mouth every 6 (six) hours as needed for mild pain.    [provider]  amLODipine (NORVASC) 10 MG tablet Take 1 tablet (10 mg total) by mouth daily. 10/21/21   Ronney Asters, NP  Ascorbic Acid (VITAMIN C) 500 MG CHEW daily at 6 (six) AM.    [provider]  aspirin EC 81 MG tablet Take 81 mg by mouth daily.    [provider]  atenolol (TENORMIN) 50 MG tablet Take 1 tablet (50 mg total) by mouth 2 (two) times daily. 10/21/21   Ronney Asters, NP  benzonatate (TESSALON) 100 MG capsule Take 1 capsule (100 mg total) by mouth every 8 (eight) hours as needed for cough. 12/27/22   Gustavus Bryant, FNP  Calcium Carb-Cholecalciferol (CALCIUM 500 + D3) 500-15 MG-MCG TABS 2 (two) times a week.    [provider]  celecoxib (  CELEBREX) 200 MG capsule Take 1 capsule (200 mg total) by mouth daily as needed. 12/27/22   Gustavus Bryant, FNP  Cholecalciferol (VITAMIN D3 PO) Take 1 capsule by mouth 4 (four) times a week. [employer] Brand    [provider]  empagliflozin (JARDIANCE) 25 MG TABS tablet Take 1 tablet (25 mg total) by mouth daily. 11/20/22   Ronney Asters, NP  escitalopram (LEXAPRO) 20 MG tablet Take 1 tablet (20 mg total) by mouth daily. 03/09/20 10/07/22  Rhetta Mura, MD  fluticasone (FLONASE) 50 MCG/ACT nasal spray Place 1 spray into both nostrils daily. 12/27/22   Gustavus Bryant, FNP  gabapentin (NEURONTIN)  100 MG capsule Take 3 capsules (300 mg total) by mouth 3 (three) times daily. Patient taking differently: Take 300 mg by mouth 2 (two) times daily. 02/12/16   Levert Feinstein, MD  hydrocortisone cream 0.5 % Apply 1 Application topically 2 (two) times daily. Apply to area on face.  Do not get in your eyes. 02/07/23   Gustavus Bryant, FNP  ibandronate (BONIVA) 150 MG tablet every 30 (thirty) days.    [provider]  KLOR-CON M10 10 MEQ tablet Take 10 mEq by mouth 3 (three) times a week. For 30 days 02/11/22   [provider]  levocetirizine (XYZAL) 5 MG tablet as needed for allergies.    [provider]  Magnesium 250 MG TABS daily.    [provider]  Multiple Vitamins-Minerals (HAIR SKIN NAILS PO) Take 1 capsule by mouth 3 (three) times a week.    [provider]  Omega-3 Fatty Acids (FISH OIL) 1000 MG CAPS Take 1,000 mg by mouth 2 (two) times a week.    [provider]  pravastatin (PRAVACHOL) 20 MG tablet TAKE 1 TABLET BY MOUTH EVERY DAY 12/14/22   Cleaver, Thomasene Ripple, NP  TRESIBA FLEXTOUCH 100 UNIT/ML FlexTouch Pen Inject 36 Units into the skin daily. 06/20/20   [provider]  triamcinolone cream (KENALOG) 0.1 % Apply 1 Application topically 2 (two) times daily. Apply to affected areas.  Do not apply to face. 02/07/23   Gustavus Bryant, FNP    Family History Family History  Problem Relation Age of Onset   Hypertension Mother    Asthma Father    Hypertension Father    Kidney cancer Father    CVA Father 44   Asthma Brother    Colon cancer Brother 24   Eczema Neg Hx    Immunodeficiency Neg Hx    Urticaria Neg Hx     Social History Social History   Tobacco Use   Smoking status: Never   Smokeless tobacco: Never  Substance Use Topics   Alcohol use: No    Alcohol/week: 0.0 standard drinks of alcohol   Drug use: Never     Allergies   Ace inhibitors, Citrus, Codeine, Lisinopril, Other, Percocet [oxycodone-acetaminophen],  Propoxyphene, and Januvia [sitagliptin]   Review of Systems Review of Systems As per HPI  Physical Exam Triage Vital Signs ED Triage Vitals  Enc Vitals Group     BP 03/28/23 1438 (!) 145/79     Pulse Rate 03/28/23 1438 70     Resp 03/28/23 1438 18     Temp 03/28/23 1438 99.2 F (37.3 C)     Temp Source 03/28/23 1438 Oral     SpO2 03/28/23 1438 95 %     Weight --      Height --      Head Circumference --  Peak Flow --      Pain Score 03/28/23 1437 8     Pain Loc --      Pain Edu? --      Excl. in GC? --    No data found.  Updated Vital Signs BP (!) 145/79   Pulse 70   Temp 99.2 F (37.3 C) (Oral)   Resp 18   SpO2 95%   Visual Acuity Right Eye Distance:   Left Eye Distance:   Bilateral Distance:    Right Eye Near:   Left Eye Near:    Bilateral Near:     Physical Exam Vitals and nursing note reviewed.  Constitutional:      General: She is not in acute distress.    Appearance: She is well-developed. She is not ill-appearing.  HENT:     Right Ear: Tympanic membrane normal.     Left Ear: Tympanic membrane normal.     Mouth/Throat:     Mouth: Mucous membranes are moist.     Tonsils: No tonsillar exudate or tonsillar abscesses.     Comments: Bilateral eye discharge.  Discharge is yellowish in color.  No conjunctival erythema. Neurological:     Mental Status: She is alert.      UC Treatments / Results  Labs (all labs ordered are listed, but only abnormal results are displayed) Labs Reviewed - No data to display  EKG   Radiology No results found.  Procedures Procedures (including critical care time)  Medications Ordered in UC Medications - No data to display  Initial Impression / Assessment and Plan / UC Course  I have reviewed the triage vital signs and the nursing notes.  Pertinent labs & imaging results that were available during my care of the patient were reviewed by me and considered in my medical decision making (see chart for  details).     1.  Acute bacterial conjunctivitis: Patanol eyedrops Erythromycin eye ointment Return to urgent care if symptoms worsen. Final Clinical Impressions(s) / UC Diagnoses   Final diagnoses:  Acute bacterial conjunctivitis of both eyes     Discharge Instructions      Please use eyedrops as directed Wipe eye discharge with warm wet rag Please return to urgent care if you have worsening symptoms.    ED Prescriptions     Medication Sig Dispense Auth. Provider   olopatadine (PATANOL) 0.1 % ophthalmic solution Place 1 drop into both eyes 2 (two) times daily for 7 days. 5 mL Dacian Orrico, Britta Mccreedy, MD   erythromycin ophthalmic ointment Place into both eyes 2 (two) times daily for 5 days. Place a 1/2 inch ribbon of ointment into the lower eyelid. 3.5 g Riki Berninger, Britta Mccreedy, MD      PDMP not reviewed this encounter.   Merrilee Jansky, MD 03/30/23 [phone]

## 2023-04-07 ENCOUNTER — Telehealth: Payer: Self-pay | Admitting: *Deleted

## 2023-04-07 NOTE — Telephone Encounter (Signed)
   Pre-operative Risk Assessment    Patient Name: Sharon Munoz  DOB: [birth year] MRN: [id number]      Request for Surgical Clearance    Procedure:   CATARACT EXTRACTION W/INTRAOCULAR LENS IMPLANTATION OF THE RIGHT EYE FOLLOWED BY THE LEFT  Date of Surgery:  Clearance 07/12/23                                 Surgeon:  DR. Mia Creek Surgeon's Group or Practice Name:  [facility] Phone number:  [phone] Fax number:  [phone]   Type of Clearance Requested:   - Medical    Type of Anesthesia:  Not Indicated   Additional requests/questions:    Sharon Munoz   04/07/2023, 7:46 AM

## 2023-04-07 NOTE — Telephone Encounter (Signed)
   Patient Name: Sharon Munoz  DOB: [birth year] MRN: [id number]  Primary Cardiologist: Nanetta Batty, MD  Chart reviewed as part of pre-operative protocol coverage. Cataract extractions are recognized in guidelines as low risk surgeries that do not typically require specific preoperative testing or holding of blood thinner therapy. Therefore, given past medical history and time since last visit, based on ACC/AHA guidelines, Sharon Munoz would be at acceptable risk for the planned procedure without further cardiovascular testing.   I will route this recommendation to the requesting party via Epic fax function and remove from pre-op pool.  Please call with questions.  Carlos Levering, NP 04/07/2023, 12:36 PM

## 2023-04-12 ENCOUNTER — Encounter: Payer: Self-pay | Admitting: Internal Medicine

## 2023-04-12 ENCOUNTER — Ambulatory Visit: Payer: Medicare Other | Attending: Internal Medicine | Admitting: Internal Medicine

## 2023-04-12 VITALS — BP 140/70 | HR 73 | Ht 63.0 in | Wt 151.4 lb

## 2023-04-12 DIAGNOSIS — I340 Nonrheumatic mitral (valve) insufficiency: Secondary | ICD-10-CM

## 2023-04-12 DIAGNOSIS — I422 Other hypertrophic cardiomyopathy: Secondary | ICD-10-CM | POA: Diagnosis not present

## 2023-04-12 DIAGNOSIS — I517 Cardiomegaly: Secondary | ICD-10-CM

## 2023-04-12 DIAGNOSIS — I429 Cardiomyopathy, unspecified: Secondary | ICD-10-CM

## 2023-04-12 NOTE — Patient Instructions (Signed)
Medication Instructions:  Your physician recommends that you continue on your current medications as directed. Please refer to the Current Medication list given to you today.  *If you need a refill on your cardiac medications before your next appointment, please call your pharmacy*   Lab Work: NONE If you have labs (blood work) drawn today and your tests are completely normal, you will receive your results only by: MyChart Message (if you have MyChart) OR A paper copy in the mail If you have any lab test that is abnormal or we need to change your treatment, we will call you to review the results.   Testing/Procedures: NONE   Follow-Up:AS NEEDED At [facility], you and your health needs are our priority.  As part of our continuing mission to provide you with exceptional heart care, we have created designated Provider Care Teams.  These Care Teams include your primary Cardiologist (physician) and Advanced Practice Providers (APPs -  Physician Assistants and Nurse Practitioners) who all work together to provide you with the care you need, when you need it.    Provider:   Mahesh Chandrasekhar, MD     

## 2023-04-12 NOTE — Progress Notes (Signed)
Cardiology Office Note:    Date:  04/12/2023   ID:  Sharon Munoz, DOB 1956-11-25, MRN [id number]  PCP:  [name], MD   [facility] Providers Cardiologist:  Nanetta Batty, MD     Referring MD: [name], MD   CC: LVH  History of Present Illness:    Sharon Munoz is a 67 y.o. female with a hx of HTN with DM, LVH, Carpal Tunnel and LVOT gradient in 2021, who presents for evaluation 05/04/22.  Seen in f/u after CMR, not consistent with HCM or Cardiac amyloidosis. 2023: no inducible LVOT gradient.  Patient notes that she is doing well.   Since last visit notes no symptoms . There are no interval hospital/ED visit.    No chest pain or pressure .  No SOB/DOE and no PND/Orthopnea.  No weight gain or leg swelling.  No palpitations or syncope .  Ambulatory blood pressure 130/70 at worse. She keeps her twin grandchildren Recent echo with mild AI  Past Medical History:  Diagnosis Date   Back spasm    Diabetes mellitus    Hypertension    Systolic ejection murmur 1986    Past Surgical History:  Procedure Laterality Date   CESAREAN SECTION     RIGHT/LEFT HEART CATH AND CORONARY ANGIOGRAPHY N/A 03/08/2020   Procedure: RIGHT/LEFT HEART CATH AND CORONARY ANGIOGRAPHY;  Surgeon: Yvonne Kendall, MD;  Location: [facility] IN[facility];  Service: Cardiovascular;  Laterality: N/A;    Current Medications: Current Meds  Medication Sig   acetaminophen (TYLENOL) 500 MG tablet Take 1,000 mg by mouth every 6 (six) hours as needed for mild pain.   amLODipine (NORVASC) 10 MG tablet Take 1 tablet (10 mg total) by mouth daily.   Ascorbic Acid (VITAMIN C) 500 MG CHEW daily at 6 (six) AM.   aspirin EC 81 MG tablet Take 81 mg by mouth daily.   atenolol (TENORMIN) 50 MG tablet Take 1 tablet (50 mg total) by mouth 2 (two) times daily.   benzonatate (TESSALON) 100 MG capsule Take 1 capsule (100 mg total) by mouth every 8 (eight) hours as needed for cough.   Calcium  Carb-Cholecalciferol (CALCIUM 500 + D3) 500-15 MG-MCG TABS 2 (two) times a week.   celecoxib (CELEBREX) 200 MG capsule Take 1 capsule (200 mg total) by mouth daily as needed.   cetirizine (ZYRTEC) 10 MG chewable tablet Chew by mouth as needed for allergies.   Cholecalciferol (VITAMIN D3 PO) Take 1 capsule by mouth 4 (four) times a week. [employer] Brand   empagliflozin (JARDIANCE) 25 MG TABS tablet Take 1 tablet (25 mg total) by mouth daily.   escitalopram (LEXAPRO) 20 MG tablet Take 1 tablet (20 mg total) by mouth daily.   fluticasone (FLONASE) 50 MCG/ACT nasal spray Place 1 spray into both nostrils daily.   gabapentin (NEURONTIN) 100 MG capsule Take 3 capsules (300 mg total) by mouth 3 (three) times daily.   gatifloxacin (ZYMAXID) 0.5 % SOLN Place into both eyes daily at 6 (six) AM.   hydrochlorothiazide (MICROZIDE) 12.5 MG capsule Take 12.5 mg by mouth daily.   hydrocortisone cream 0.5 % Apply 1 Application topically 2 (two) times daily. Apply to area on face.  Do not get in your eyes.   ibandronate (BONIVA) 150 MG tablet every 30 (thirty) days.   ketorolac (ACULAR) 0.5 % ophthalmic solution Place 1 drop into the right eye 4 (four) times daily.   KLOR-CON M10 10 MEQ tablet Take 10 mEq by mouth  3 (three) times a week. For 30 days   levocetirizine (XYZAL) 5 MG tablet as needed for allergies.   Magnesium 250 MG TABS daily.   Multiple Vitamins-Minerals (HAIR SKIN NAILS PO) Take 1 capsule by mouth 3 (three) times a week.   Omega-3 Fatty Acids (FISH OIL) 1000 MG CAPS Take 1,000 mg by mouth 2 (two) times a week.   pravastatin (PRAVACHOL) 20 MG tablet TAKE 1 TABLET BY MOUTH EVERY DAY   prednisoLONE acetate (PRED FORTE) 1 % ophthalmic suspension Place into both eyes daily at 6 (six) AM.   TRESIBA FLEXTOUCH 100 UNIT/ML FlexTouch Pen Inject 36 Units into the skin daily.   triamcinolone cream (KENALOG) 0.1 % Apply 1 Application topically 2 (two) times daily. Apply to affected areas.  Do not apply to  face.     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  Tobacco Use   Smoking status: Never   Smokeless tobacco: Never  Substance and Sexual Activity   Alcohol use: No    Alcohol/week: 0.0 standard drinks of alcohol   Drug use: Never   Sexual activity: Not Currently  Other Topics Concern   Not on file  Social History Narrative   Not on file   Social Determinants of Health   Financial Resource Strain: Not on file  Food Insecurity: Not on file  Transportation Needs: Not on file  Physical Activity: Not on file  Stress: Not on file  Social Connections: Not on file    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 There is no history of Eczema, Immunodeficiency, or Urticaria.  ROS:   Please see the history of present illness.    All other systems reviewed and are negative.  EKGs/Labs/Other Studies Reviewed:    The following studies were reviewed today:  EKG:   2024: SR bi-atrial enlargement septal infarct rate 73 2022: SR rate 74 Septal Infarct  Cardiac Studies & Procedures   CARDIAC CATHETERIZATION  CARDIAC CATHETERIZATION 03/08/2020  Narrative Conclusions: 1. Tortuous coronary arteries without angiographically significant stenosis. 2. Upper normal to mildly elevated left heart and pulmonary artery  pressures. 3. Normal right heart filling pressures. 4. Normal cardiac output/index. 5. Dynamic LVOT gradient of 25 mmHg at rest and 65-70 mmHg post-PVC.  Recommendations: 1. Primary prevention of coronary artery disease. 2. Hold amlodipine and increase metoprolol tartrate to 100 mg twice daily to improve rate control.  Yvonne Kendall, MD [facility]  Findings Coronary Findings Diagnostic  Dominance: Right  Left Main Vessel is large.  Left Anterior Descending Vessel is large. Vessel is angiographically normal. The vessel is severely tortuous.  First Diagonal Branch Vessel is moderate in size.  Second Diagonal Branch Vessel is small in size.  Third Diagonal Branch Vessel is moderate in size.  Left Circumflex Vessel is large. Vessel is angiographically normal.  The vessel is moderately tortuous.  First Obtuse Marginal Branch Vessel is large in size.  Second Obtuse Marginal Branch Vessel is small in size.  Third Obtuse Marginal Branch Vessel is small in size.  Right Coronary Artery Vessel is large. Vessel is angiographically normal.  Right Posterior Descending Artery Vessel is moderate in size. The vessel is tortuous.  Right Posterior Atrioventricular Artery Vessel is moderate in size.  Intervention  No interventions have been documented.   STRESS TESTS  ECHOCARDIOGRAM STRESS TEST 10/28/2022  Narrative EXERCISE STRESS ECHO REPORT   --------------------------------------------------------------------------------  Patient Name:   AMANPREET BONURA Date of Exam: 10/28/2022 Medical Rec #:  [id number]        Height:       63.0 in Accession #:    [id number]       Weight:       153.0 lb Date of Birth:  12/10/1955         BSA:          1.726 m Patient Age:    66 years         BP:           143/86 mmHg Patient Gender: F                HR:           65 bpm. Exam Location:  [address]  Procedure: Stress Echo  Indications:    I34.0 Nonrheumatic mitral  (valve) insufficiency Exercise associated mitral regurgitation, Inducible LVOT gradient  History:        Patient has prior history of Echocardiogram examinations, most recent 03/19/2022. Dyspnea on exertion. Asymmetric septal hypertrophy. LVH.  Sonographer:    Cathie Beams RCS Referring Phys: [id number] [facility] A Lucine Bilski  IMPRESSIONS   1. Baseline AV sclerosis with mild AR and trivial AR. 2. Baseline ECG with LVH no SAM but appeared to be some apposition of basal septum and hypertrophied papillary muscle. 3. Poor exercise tolerance only achieved 5.4 METS with maximum HR 106 bom Negative ETT for ischemia. 4. With stress peak LVOT gradient 12 mmHg and no change in MR. 5. This is a negative stress echocardiogram for ischemia. 6. This is a low risk study.  FINDINGS  Exam Protocol: The patient exercised on a treadmill according to a Bruce protocol.   Patient Performance: The patient exercised for 3 minutes and 44 seconds, achieving 5.4 METS. The maximum stage achieved was I of the Bruce protocol. The heart rate at peak stress was 103 bpm. The target heart rate was calculated to be 130 bpm. The percentage of maximum predicted heart rate achieved was 67.1 %. The baseline blood pressure was 143/86 mmHg. The blood pressure at peak stress was 173/94 mmHg. The blood pressure response was normal. The patient developed fatigue during the stress exam. Patient only exercised 3:45 minutes with maximum HR of 106 bpm.  EKG: Resting EKG showed normal sinus rhythm with no abnormal findings. The patient developed no abnormal EKG findings during exercise.   2D Echo Findings: Baseline regional wall motion abnormalities were not present. There were no stress-induced wall motion abnormalities. This is a negative stress echocardiogram for ischemia.  Additional Findings: Poor exercise tolerance only achieved 5.4 METS with maximum HR 106 bom Negative ETT for ischemia. Baseline ECG with LVH no SAM but  appeared to be some apposition of basal septum and hypertrophied papillary muscle. Baseline AV sclerosis with mild AR and trivial AR. With stress peak LVOT gradient 12 mmHg and no  change in MR.   Charlton Haws MD Electronically signed on 10/28/2022 at 4:50:25 PM     Final   ECHOCARDIOGRAM  ECHOCARDIOGRAM COMPLETE 03/22/2023  Narrative ECHOCARDIOGRAM REPORT    Patient Name:   ARDYN EPLER Date of Exam: 03/17/2023 Medical Rec #:  [id number]        Height:       63.0 in Accession #:    [id number]       Weight:       148.0 lb Date of Birth:  February 20, 1956         BSA:          1.701 m Patient Age:    66 years         BP:           159/83 mmHg Patient Gender: F                HR:           54 bpm. Exam Location:  Outpatient  Procedure: 2D Echo, Cardiac Doppler, Color Doppler, 3D Echo and Strain Analysis  Indications:    Essential hypertension [829562]  History:        Patient has prior history of Echocardiogram examinations, most recent 10/28/2022. Risk Factors:Diabetes, Dyslipidemia and Hypertension.  Sonographer:    Eulah Pont RDCS Referring Phys: [phone] Delton See BERRY   Sonographer Comments: Global longitudinal strain was attempted. IMPRESSIONS   1. Left ventricular ejection fraction, by estimation, is 55 to 60%. Left ventricular ejection fraction by 3D volume is 56 %. The left ventricle has normal function. The left ventricle has no regional wall motion abnormalities. Left ventricular diastolic parameters are consistent with Grade I diastolic dysfunction (impaired relaxation). The average left ventricular global longitudinal strain is -20.0 %. The global longitudinal strain is normal. 2. Right ventricular systolic function is normal. The right ventricular size is normal. 3. The mitral valve is normal in structure. Mild mitral valve regurgitation. No evidence of mitral stenosis. 4. The aortic valve is tricuspid. Aortic valve regurgitation is mild. No aortic stenosis is  present. Aortic regurgitation PHT measures 489 msec. 5. The inferior vena cava is normal in size with greater than 50% respiratory variability, suggesting right atrial pressure of 3 mmHg.  Comparison(s): Prior images reviewed side by side.  FINDINGS Left Ventricle: Left ventricular ejection fraction, by estimation, is 55 to 60%. Left ventricular ejection fraction by 3D volume is 56 %. The left ventricle has normal function. The left ventricle has no regional wall motion abnormalities. The average left ventricular global longitudinal strain is -20.0 %. The global longitudinal strain is normal. The left ventricular internal cavity size was normal in size. There is no left ventricular hypertrophy. Left ventricular diastolic parameters are consistent with Grade I diastolic dysfunction (impaired relaxation).  Right Ventricle: The right ventricular size is normal. No increase in right ventricular wall thickness. Right ventricular systolic function is normal.  Left Atrium: Left atrial size was normal in size.  Right Atrium: Right atrial size was normal in size.  Pericardium: There is no evidence of pericardial effusion.  Mitral Valve: The mitral valve is normal in structure. Mild mitral valve regurgitation. No evidence of mitral valve stenosis.  Tricuspid Valve: The tricuspid valve is normal in structure. Tricuspid valve regurgitation is mild . No evidence of tricuspid stenosis.  Aortic Valve: The aortic valve is tricuspid. Aortic valve regurgitation is mild. Aortic regurgitation PHT measures 489 msec. No aortic stenosis is present.  Pulmonic Valve: The pulmonic valve  was normal in structure. Pulmonic valve regurgitation is not visualized. No evidence of pulmonic stenosis.  Aorta: The aortic root is normal in size and structure.  Venous: The inferior vena cava is normal in size with greater than 50% respiratory variability, suggesting right atrial pressure of 3 mmHg.  IAS/Shunts: No atrial  level shunt detected by color flow Doppler.   LEFT VENTRICLE PLAX 2D LVIDd:         3.80 cm         Diastology LVIDs:         2.40 cm         LV e' medial:    6.53 cm/s LV PW:         1.10 cm         LV E/e' medial:  12.4 LV IVS:        1.10 cm         LV e' lateral:   9.38 cm/s LVOT diam:     1.80 cm         LV E/e' lateral: 8.6 LV SV:         72 LV SV Index:   42              2D LVOT Area:     2.54 cm        Longitudinal Strain 2D Strain GLS  -20.0 % LV Volumes (MOD)               Avg: LV vol d, MOD    83.8 ml A2C:                           3D Volume EF LV vol d, MOD    89.9 ml       LV 3D EF:    Left A4C:                                        ventricul LV vol s, MOD    27.5 ml                    ar A2C:                                        ejection LV vol s, MOD    27.6 ml                    fraction A4C:                                        by 3D LV SV MOD A2C:   56.3 ml                    volume is LV SV MOD A4C:   89.9 ml                    56 %. LV SV MOD BP:    62.8 ml  3D Volume EF: 3D EF:        56 % LV EDV:       146 ml LV ESV:  65 ml LV SV:        81 ml  RIGHT VENTRICLE RV S prime:     10.80 cm/s TAPSE (M-mode): 2.3 cm  LEFT ATRIUM             Index        RIGHT ATRIUM           Index LA diam:        3.70 cm 2.17 cm/m   RA Area:     12.00 cm LA Vol (A2C):   34.9 ml 20.51 ml/m  RA Volume:   26.50 ml  15.58 ml/m LA Vol (A4C):   39.8 ml 23.39 ml/m LA Biplane Vol: 38.0 ml 22.33 ml/m AORTIC VALVE LVOT Vmax:         140.00 cm/s LVOT Vmean:        84.400 cm/s LVOT VTI:          0.283 m AI PHT:            489 msec AR Vena Contracta: 0.20 cm  AORTA Ao Root diam: 3.00 cm Ao Asc diam:  3.70 cm  MITRAL VALVE MV Area (PHT): 2.82 cm    SHUNTS MV Decel Time: 269 msec    Systemic VTI:  0.28 m MV E velocity: 80.90 cm/s  Systemic Diam: 1.80 cm MV A velocity: 94.00 cm/s MV E/A ratio:  0.86  Donato Schultz MD Electronically signed by Donato Schultz  MD Signature Date/Time: 03/22/2023/12:47:42 PM    Final    MONITORS  LONG TERM MONITOR (3-14 DAYS) 05/31/2022  Narrative  Patient had a minimum heart rate of 31 bpm, maximum heart rate of 125 bpm, and average heart rate of 75 bpm.  Predominant underlying rhythm was sinus rhythm.  One four beat run of NSVT with a max rate of 109 bpm at fastest.  Isolated PACs were rare (<1.0%).  Isolated PVCs were rare (<1.0%).  Mobitz Type I heart block seen (7 AM, heart rate 31 bpm).  Triggered and diary events associated with sinus rhythm.  Asymptomatic morning Mobitz Type I heart block.    CARDIAC MRI  MR CARDIAC MORPHOLOGY W WO CONTRAST 07/14/2022  Narrative CLINICAL DATA:  Clinical question of hypertrophic cardiomyopathy Study assumes HCT of 42 and BSA of 1.75 m2.  EXAM: CARDIAC MRI  TECHNIQUE: The patient was scanned on a 1.5 Tesla GE magnet. A dedicated cardiac coil was used. Functional imaging was done using Fiesta sequences. 2,3, and 4 chamber views were done to assess for RWMA's. Modified Simpson's rule using a short axis stack was used to calculate an ejection fraction on a dedicated work [employer]. The patient received 10 cc of Gadavist. After 10 minutes inversion recovery sequences were used to assess for infiltration and scar tissue.  CONTRAST:  10 cc  of Gadavist  FINDINGS: 1. Normal left ventricular size, with LVEDD 54 mm, and LVEDVi 78 mL/m2.  Mild asymmetric remodeling, with mid-intraventricular septal thickness of 12 mm, posterior wall thickness of 8 mm, and myocardial mass index of 55 g/m2.  Normal left ventricular systolic function (LVEF =64%). There are no regional wall motion abnormalities. There is no systolic motion of the anterior valve.  Left ventricular parametric mapping notable for normal T2 and ECV.  There is no late gadolinium enhancement in the left ventricular myocardium.  2. Normal right ventricular size with  RVEDVI 75 mL/m2.  Normal right ventricular thickness.  Normal right ventricular systolic function (RVEF =52%). There are no regional wall  motion abnormalities or aneurysms.  3.  Normal left and right atrial size.  4. Normal size of the aortic root, ascending aorta and pulmonary artery.  5. Valve assessment:  Aortic Valve: Tri-leaflet aortic valve. No aortic stenosis. Regurgitant fraction 1 %. No significant regurgitation.  Pulmonic Valve: Regurgitant fraction 1 %. No significant regurgitation.  Tricuspid Valve: Regurgitant fraction 7 %. Mild central regurgitation.  Mitral Valve: Regurgitant fraction 8 %.  Mild central regurgitation.  6.  Normal pericardium.  No pericardial effusion.  7. Grossly, no extracardiac findings. Recommended dedicated study if concerned for non-cardiac pathology.  IMPRESSION: Study does not meet diagnostic criteria for hypertrophic cardiomyopathy.  Riley Lam MD   Electronically Signed By: Riley Lam M.D. On: 07/14/2022 21:36            Recent Labs: 05/05/2022: Hemoglobin 14.6; Platelets 259  Recent Lipid Panel    Component Value Date/Time   CHOL 143 03/08/2020 0815   TRIG 41 03/08/2020 0815   HDL 80 03/08/2020 0815   CHOLHDL 1.8 03/08/2020 0815   VLDL 8 03/08/2020 0815   LDLCALC 55 03/08/2020 0815   LDLDIRECT 119.6 10/04/2007 0919       Physical Exam:    VS:  BP (!) 140/70   Pulse 73   Ht 5\' 3"  (1.6 m)   Wt 151 lb 6.4 oz (68.7 kg)   SpO2 98%   BMI 26.82 kg/m     Wt Readings from Last 3 Encounters:  04/12/23 151 lb 6.4 oz (68.7 kg)  02/07/23 148 lb (67.1 kg)  10/07/22 153 lb (69.4 kg)   Gen: no distress   Neck: No JVD Cardiac: No Rubs or Gallops, soft systolic murmur , RRR +2 radial pulses Respiratory: Clear to auscultation bilaterally, normal effort, normal  respiratory rate GI: Soft, nontender, non-distended  MS: No  edema;  moves all extremities Integument: Skin feels warn Neuro:  At time  of evaluation, alert and oriented to person/place/time/situation  Psych: Normal affect, patient feels nervous   ASSESSMENT:    1. LVH (left ventricular hypertrophy)   2. Nonrheumatic mitral valve regurgitation   3. Asymmetric septal hypertrophy (HCC)   4. Cardiomyopathy, unspecified type (HCC)     PLAN:    Hx of stress cardiomyopathy Family history MI (brother 41, survived; died of cancer) - LV has recovered  Basal septal hypertrophy with prior LVOT obstruction - morphologically more consistent with basal septal hypertrophy - peak gradient of 17 mm Hg on current therapy (exercise) - No scar, no ectopy on monitor - at this point will take conservative approach as she does note meet diagnostic criteria for HCM and is asymptomatic  Mild AI - echo in three years  PRN with me; when Dr. Silvio Pate I'd be happy to resume her non-HCM care; can help if new issues arise   Medication Adjustments/Labs and Tests Ordered: Current medicines are reviewed at length with the patient today.  Concerns regarding medicines are outlined above.  Orders Placed This Encounter  Procedures   EKG 12-Lead   No orders of the defined types were placed in this encounter.   Patient Instructions  Medication Instructions:  Your physician recommends that you continue on your current medications as directed. Please refer to the Current Medication list given to you today.  *If you need a refill on your cardiac medications before your next appointment, please call your pharmacy*   Lab Work: NONE If you have labs (blood work) drawn today and your tests are completely normal,  you will receive your results only by: MyChart Message (if you have MyChart) OR A paper copy in the mail If you have any lab test that is abnormal or we need to change your treatment, we will call you to review the results.   Testing/Procedures: NONE   Follow-Up:AS NEEDED At [facility], you and your health needs  are our priority.  As part of our continuing mission to provide you with exceptional heart care, we have created designated Provider Care Teams.  These Care Teams include your primary Cardiologist (physician) and Advanced Practice Providers (APPs -  Physician Assistants and Nurse Practitioners) who all work together to provide you with the care you need, when you need it.    Provider:   Riley Lam, MD       Signed, Christell Constant, MD  04/12/2023 12:44 PM    [facility]

## 2023-04-16 NOTE — Addendum Note (Signed)
Encounter addended by: Linnell Fulling on: 04/16/2023 12:08 PM  Actions taken: Imaging Exam ended

## 2023-04-19 ENCOUNTER — Other Ambulatory Visit: Payer: Self-pay

## 2023-04-19 ENCOUNTER — Emergency Department (HOSPITAL_BASED_OUTPATIENT_CLINIC_OR_DEPARTMENT_OTHER): Payer: Medicare Other

## 2023-04-19 ENCOUNTER — Encounter (HOSPITAL_BASED_OUTPATIENT_CLINIC_OR_DEPARTMENT_OTHER): Payer: Self-pay | Admitting: Emergency Medicine

## 2023-04-19 ENCOUNTER — Inpatient Hospital Stay (HOSPITAL_BASED_OUTPATIENT_CLINIC_OR_DEPARTMENT_OTHER)
Admission: EM | Admit: 2023-04-19 | Discharge: 2023-04-21 | DRG: 121 | Disposition: A | Discharge status: Home / Self Care | Payer: Medicare Other | Attending: Internal Medicine | Admitting: Internal Medicine

## 2023-04-19 DIAGNOSIS — Z8051 Family history of malignant neoplasm of kidney: Secondary | ICD-10-CM

## 2023-04-19 DIAGNOSIS — Z7984 Long term (current) use of oral hypoglycemic drugs: Secondary | ICD-10-CM

## 2023-04-19 DIAGNOSIS — Z1152 Encounter for screening for COVID-19: Secondary | ICD-10-CM | POA: Diagnosis not present

## 2023-04-19 DIAGNOSIS — Z91010 Allergy to peanuts: Secondary | ICD-10-CM | POA: Diagnosis not present

## 2023-04-19 DIAGNOSIS — L03213 Periorbital cellulitis: Secondary | ICD-10-CM | POA: Diagnosis present

## 2023-04-19 DIAGNOSIS — Z885 Allergy status to narcotic agent status: Secondary | ICD-10-CM | POA: Diagnosis not present

## 2023-04-19 DIAGNOSIS — H05012 Cellulitis of left orbit: Secondary | ICD-10-CM | POA: Diagnosis not present

## 2023-04-19 DIAGNOSIS — Z794 Long term (current) use of insulin: Secondary | ICD-10-CM

## 2023-04-19 DIAGNOSIS — Z823 Family history of stroke: Secondary | ICD-10-CM | POA: Diagnosis not present

## 2023-04-19 DIAGNOSIS — E1142 Type 2 diabetes mellitus with diabetic polyneuropathy: Secondary | ICD-10-CM | POA: Diagnosis present

## 2023-04-19 DIAGNOSIS — Z79899 Other long term (current) drug therapy: Secondary | ICD-10-CM

## 2023-04-19 DIAGNOSIS — Z825 Family history of asthma and other chronic lower respiratory diseases: Secondary | ICD-10-CM | POA: Diagnosis not present

## 2023-04-19 DIAGNOSIS — Z8249 Family history of ischemic heart disease and other diseases of the circulatory system: Secondary | ICD-10-CM | POA: Diagnosis not present

## 2023-04-19 DIAGNOSIS — E0800 Diabetes mellitus due to underlying condition with hyperosmolarity without nonketotic hyperglycemic-hyperosmolar coma (NKHHC): Secondary | ICD-10-CM

## 2023-04-19 DIAGNOSIS — E876 Hypokalemia: Secondary | ICD-10-CM | POA: Diagnosis present

## 2023-04-19 DIAGNOSIS — E785 Hyperlipidemia, unspecified: Secondary | ICD-10-CM | POA: Diagnosis present

## 2023-04-19 DIAGNOSIS — L03211 Cellulitis of face: Secondary | ICD-10-CM | POA: Diagnosis present

## 2023-04-19 DIAGNOSIS — Z91018 Allergy to other foods: Secondary | ICD-10-CM | POA: Diagnosis not present

## 2023-04-19 DIAGNOSIS — F32A Depression, unspecified: Secondary | ICD-10-CM | POA: Diagnosis present

## 2023-04-19 DIAGNOSIS — E119 Type 2 diabetes mellitus without complications: Secondary | ICD-10-CM

## 2023-04-19 DIAGNOSIS — E11649 Type 2 diabetes mellitus with hypoglycemia without coma: Secondary | ICD-10-CM | POA: Diagnosis not present

## 2023-04-19 DIAGNOSIS — Z886 Allergy status to analgesic agent status: Secondary | ICD-10-CM | POA: Diagnosis not present

## 2023-04-19 DIAGNOSIS — Z7982 Long term (current) use of aspirin: Secondary | ICD-10-CM

## 2023-04-19 DIAGNOSIS — Z800 Family history of malignant neoplasm of digestive organs: Secondary | ICD-10-CM | POA: Diagnosis not present

## 2023-04-19 DIAGNOSIS — Z635 Disruption of family by separation and divorce: Secondary | ICD-10-CM

## 2023-04-19 DIAGNOSIS — I10 Essential (primary) hypertension: Secondary | ICD-10-CM | POA: Diagnosis present

## 2023-04-19 DIAGNOSIS — Z888 Allergy status to other drugs, medicaments and biological substances status: Secondary | ICD-10-CM | POA: Diagnosis not present

## 2023-04-19 DIAGNOSIS — M62830 Muscle spasm of back: Secondary | ICD-10-CM | POA: Diagnosis present

## 2023-04-19 LAB — CBC
HCT: 43.3 % (ref 36.0–46.0)
HCT: 42.4 % (ref 36.0–46.0)
Hemoglobin: 14.1 g/dL (ref 12.0–15.0)
Hemoglobin: 13.8 g/dL (ref 12.0–15.0)
MCH: 29.5 pg (ref 26.0–34.0)
MCH: 29.7 pg (ref 26.0–34.0)
MCHC: 32.6 g/dL (ref 30.0–36.0)
MCHC: 32.5 g/dL (ref 30.0–36.0)
MCV: 90.6 fL (ref 80.0–100.0)
MCV: 91.4 fL (ref 80.0–100.0)
Platelets: 255 10*3/uL (ref 150–400)
Platelets: 241 10*3/uL (ref 150–400)
RBC: 4.78 MIL/uL (ref 3.87–5.11)
RBC: 4.64 MIL/uL (ref 3.87–5.11)
RDW: 14.2 % (ref 11.5–15.5)
RDW: 14.3 % (ref 11.5–15.5)
WBC: 7.7 10*3/uL (ref 4.0–10.5)
WBC: 6.6 10*3/uL (ref 4.0–10.5)
nRBC: 0 % (ref 0.0–0.2)
nRBC: 0 % (ref 0.0–0.2)

## 2023-04-19 LAB — BASIC METABOLIC PANEL
Anion gap: 9 (ref 5–15)
Anion gap: 11 (ref 5–15)
BUN: 12 mg/dL (ref 8–23)
BUN: 10 mg/dL (ref 8–23)
CO2: 28 mmol/L (ref 22–32)
CO2: 23 mmol/L (ref 22–32)
Calcium: 9.2 mg/dL (ref 8.9–10.3)
Calcium: 8.6 mg/dL — ABNORMAL LOW (ref 8.9–10.3)
Chloride: 105 mmol/L (ref 98–111)
Chloride: 106 mmol/L (ref 98–111)
Creatinine, Ser: 0.44 mg/dL (ref 0.44–1.00)
Creatinine, Ser: 0.5 mg/dL (ref 0.44–1.00)
GFR, Estimated: >60 mL/min (ref >60)
GFR, Estimated: >60 mL/min (ref >60)
Glucose, Bld: 94 mg/dL (ref 70–99)
Glucose, Bld: 224 mg/dL — ABNORMAL HIGH (ref 70–99)
Potassium: 2.9 mmol/L — ABNORMAL LOW (ref 3.5–5.1)
Potassium: 2.9 mmol/L — ABNORMAL LOW (ref 3.5–5.1)
Sodium: 142 mmol/L (ref 135–145)
Sodium: 140 mmol/L (ref 135–145)

## 2023-04-19 LAB — GLUCOSE, CAPILLARY
Glucose-Capillary: 181 mg/dL — ABNORMAL HIGH (ref 70–99)
Glucose-Capillary: 53 mg/dL — ABNORMAL LOW (ref 70–99)
Glucose-Capillary: 237 mg/dL — ABNORMAL HIGH (ref 70–99)
Glucose-Capillary: 94 mg/dL (ref 70–99)
Glucose-Capillary: 57 mg/dL — ABNORMAL LOW (ref 70–99)
Glucose-Capillary: 144 mg/dL — ABNORMAL HIGH (ref 70–99)
Glucose-Capillary: 117 mg/dL — ABNORMAL HIGH (ref 70–99)

## 2023-04-19 LAB — HEMOGLOBIN A1C
Hgb A1c MFr Bld: 7.6 % — ABNORMAL HIGH (ref 4.8–5.6)
Mean Plasma Glucose: 171.42 mg/dL

## 2023-04-19 LAB — CREATININE, SERUM
Creatinine, Ser: 0.41 mg/dL — ABNORMAL LOW (ref 0.44–1.00)
GFR, Estimated: >60 mL/min (ref >60)

## 2023-04-19 LAB — MAGNESIUM: Magnesium: 1.8 mg/dL (ref 1.7–2.4)

## 2023-04-19 MED ORDER — ATENOLOL 25 MG PO TABS
50.0000 mg | ORAL_TABLET | Freq: Two times a day (BID) | ORAL | Status: DC
  Administered 2023-04-19 – 2023-04-21 (×5): 50 mg via ORAL
  Filled 2023-04-19 (×5): qty 2

## 2023-04-19 MED ORDER — ESCITALOPRAM OXALATE 20 MG PO TABS
20.0000 mg | ORAL_TABLET | Freq: Every day | ORAL | Status: DC
  Administered 2023-04-19 – 2023-04-21 (×3): 20 mg via ORAL
  Filled 2023-04-19 (×3): qty 1

## 2023-04-19 MED ORDER — SODIUM CHLORIDE 0.9 % IV SOLN
2.0000 g | INTRAVENOUS | Status: DC
  Administered 2023-04-19 – 2023-04-20 (×2): 2 g via INTRAVENOUS
  Filled 2023-04-19 (×2): qty 20

## 2023-04-19 MED ORDER — AMLODIPINE BESYLATE 10 MG PO TABS
10.0000 mg | ORAL_TABLET | Freq: Every day | ORAL | Status: DC
  Administered 2023-04-19 – 2023-04-21 (×3): 10 mg via ORAL
  Filled 2023-04-19 (×3): qty 1

## 2023-04-19 MED ORDER — ONDANSETRON HCL 4 MG/2ML IJ SOLN: 4.0000 mg | Freq: Four times a day (QID) | INTRAMUSCULAR | Status: DC | PRN

## 2023-04-19 MED ORDER — SENNOSIDES-DOCUSATE SODIUM 8.6-50 MG PO TABS: 1.0000 | ORAL_TABLET | Freq: Every evening | ORAL | Status: DC | PRN

## 2023-04-19 MED ORDER — POTASSIUM CHLORIDE CRYS ER 20 MEQ PO TBCR
40.0000 meq | EXTENDED_RELEASE_TABLET | ORAL | Status: AC
  Administered 2023-04-19 (×2): 40 meq via ORAL
  Filled 2023-04-19 (×2): qty 2

## 2023-04-19 MED ORDER — HEPARIN SODIUM (PORCINE) 5000 UNIT/ML IJ SOLN
5000.0000 [IU] | Freq: Three times a day (TID) | INTRAMUSCULAR | Status: DC
  Administered 2023-04-19 – 2023-04-21 (×7): 5000 [IU] via SUBCUTANEOUS
  Filled 2023-04-19 (×7): qty 1

## 2023-04-19 MED ORDER — IOHEXOL 300 MG/ML  SOLN
100.0000 mL | Freq: Once | INTRAMUSCULAR | Status: AC | PRN
  Administered 2023-04-19: 75 mL via INTRAVENOUS

## 2023-04-19 MED ORDER — INSULIN GLARGINE-YFGN 100 UNIT/ML ~~LOC~~ SOLN
15.0000 [IU] | Freq: Every day | SUBCUTANEOUS | Status: DC
  Administered 2023-04-19 – 2023-04-21 (×3): 15 [IU] via SUBCUTANEOUS
  Filled 2023-04-19 (×3): qty 0.15

## 2023-04-19 MED ORDER — GABAPENTIN 300 MG PO CAPS
300.0000 mg | ORAL_CAPSULE | Freq: Two times a day (BID) | ORAL | Status: DC
  Administered 2023-04-19 – 2023-04-21 (×5): 300 mg via ORAL
  Filled 2023-04-19 (×5): qty 1

## 2023-04-19 MED ORDER — VANCOMYCIN HCL 1250 MG/250ML IV SOLN
1250.0000 mg | INTRAVENOUS | Status: DC
  Administered 2023-04-20 – 2023-04-21 (×2): 1250 mg via INTRAVENOUS
  Filled 2023-04-19 (×2): qty 250

## 2023-04-19 MED ORDER — VANCOMYCIN HCL 1500 MG/300ML IV SOLN
1500.0000 mg | Freq: Once | INTRAVENOUS | Status: AC
  Administered 2023-04-19: 1500 mg via INTRAVENOUS
  Filled 2023-04-19: qty 300

## 2023-04-19 MED ORDER — SODIUM CHLORIDE 0.9 % IV SOLN
2.0000 g | Freq: Once | INTRAVENOUS | Status: AC
  Administered 2023-04-19: 2 g via INTRAVENOUS
  Filled 2023-04-19: qty 20

## 2023-04-19 MED ORDER — HYDROXYZINE HCL 25 MG PO TABS
25.0000 mg | ORAL_TABLET | Freq: Three times a day (TID) | ORAL | Status: DC
  Administered 2023-04-19: 25 mg via ORAL
  Filled 2023-04-19: qty 1

## 2023-04-19 MED ORDER — SODIUM CHLORIDE 0.9 % IV SOLN
3.0000 g | Freq: Four times a day (QID) | INTRAVENOUS | Status: DC
  Filled 2023-04-19 (×2): qty 8

## 2023-04-19 MED ORDER — INSULIN ASPART 100 UNIT/ML IJ SOLN
0.0000 [IU] | Freq: Three times a day (TID) | INTRAMUSCULAR | Status: DC
  Administered 2023-04-19: 5 [IU] via SUBCUTANEOUS
  Administered 2023-04-20: 2 [IU] via SUBCUTANEOUS
  Administered 2023-04-21: 5 [IU] via SUBCUTANEOUS
  Administered 2023-04-21: 2 [IU] via SUBCUTANEOUS

## 2023-04-19 MED ORDER — FENTANYL CITRATE PF 50 MCG/ML IJ SOSY
25.0000 ug | PREFILLED_SYRINGE | Freq: Once | INTRAMUSCULAR | Status: AC
  Administered 2023-04-19: 25 ug via INTRAVENOUS
  Filled 2023-04-19: qty 1

## 2023-04-19 MED ORDER — SODIUM CHLORIDE 0.9 % IV SOLN
3.0000 g | Freq: Once | INTRAVENOUS | Status: AC
  Administered 2023-04-19: 3 g via INTRAVENOUS

## 2023-04-19 MED ORDER — INSULIN ASPART 100 UNIT/ML IJ SOLN
0.0000 [IU] | Freq: Every day | INTRAMUSCULAR | Status: DC
  Administered 2023-04-20: 2 [IU] via SUBCUTANEOUS

## 2023-04-19 MED ORDER — DIPHENHYDRAMINE HCL 25 MG PO CAPS
25.0000 mg | ORAL_CAPSULE | Freq: Four times a day (QID) | ORAL | Status: DC | PRN
  Administered 2023-04-19 – 2023-04-20 (×4): 25 mg via ORAL
  Filled 2023-04-19 (×4): qty 1

## 2023-04-19 MED ORDER — INSULIN DEGLUDEC 100 UNIT/ML ~~LOC~~ SOPN: 15.0000 [IU] | PEN_INJECTOR | Freq: Every day | SUBCUTANEOUS | Status: DC

## 2023-04-19 MED ORDER — ONDANSETRON HCL 4 MG/2ML IJ SOLN
4.0000 mg | Freq: Once | INTRAMUSCULAR | Status: AC
Start: 2023-04-19 — End: 2023-04-19
  Administered 2023-04-19: 4 mg via INTRAVENOUS
  Filled 2023-04-19: qty 2

## 2023-04-19 MED ORDER — METRONIDAZOLE 500 MG/100ML IV SOLN
500.0000 mg | Freq: Two times a day (BID) | INTRAVENOUS | Status: DC
  Administered 2023-04-19 – 2023-04-21 (×5): 500 mg via INTRAVENOUS
  Filled 2023-04-19 (×5): qty 100

## 2023-04-19 MED ORDER — ACETAMINOPHEN 325 MG PO TABS: 650.0000 mg | ORAL_TABLET | Freq: Four times a day (QID) | ORAL | Status: DC | PRN

## 2023-04-19 MED ORDER — INSULIN DEGLUDEC 100 UNIT/ML ~~LOC~~ SOPN: 36.0000 [IU] | PEN_INJECTOR | Freq: Every day | SUBCUTANEOUS | Status: DC

## 2023-04-19 MED ORDER — ONDANSETRON HCL 4 MG PO TABS: 4.0000 mg | ORAL_TABLET | Freq: Four times a day (QID) | ORAL | Status: DC | PRN

## 2023-04-19 MED ORDER — FLUTICASONE PROPIONATE 50 MCG/ACT NA SUSP
1.0000 | Freq: Every day | NASAL | Status: DC
  Administered 2023-04-20 – 2023-04-21 (×2): 1 via NASAL
  Filled 2023-04-19: qty 16

## 2023-04-19 MED ORDER — PRAVASTATIN SODIUM 20 MG PO TABS
20.0000 mg | ORAL_TABLET | Freq: Every day | ORAL | Status: DC
  Administered 2023-04-19 – 2023-04-21 (×3): 20 mg via ORAL
  Filled 2023-04-19 (×3): qty 1

## 2023-04-19 MED ORDER — MORPHINE SULFATE (PF) 2 MG/ML IV SOLN: 2.0000 mg | INTRAVENOUS | Status: DC | PRN

## 2023-04-19 MED ORDER — ACETAMINOPHEN 650 MG RE SUPP
650.0000 mg | Freq: Four times a day (QID) | RECTAL | Status: DC | PRN
Start: 2023-04-19 — End: 2023-04-19

## 2023-04-19 NOTE — Progress Notes (Signed)
Pharmacy Antibiotic Note  Sharon Munoz is a 67 y.o. female admitted on 04/19/2023 with  left upper eyelid erythema, tenderness .  Pt recently treated for bilateral bacterial conjunctivitis which resolved after completion of ophthalmic drops.   Pharmacy has been consulted for vancomycin dosing for preseptal cellulitis. Pt initially started on ampicillin/sulbactam. Antibiotics being changed to vancomycin + ceftriaxone + metronidazole.  Today, 04/19/23 WBC WNL, afebrile SCr low/WNL. Round to 0.8 for AUC calculations  Plan: Vancomycin 1500 mg loading dose followed by 1250 mg IV q24h for estimated AUC of 496. Goal AUC 400-550.  Follow renal function. Check levels as needed.   Height: 5\' 3"  (160 cm) Weight: 67.4 kg (148 lb 9.4 oz) IBW/kg (Calculated) : 52.4  Temp (24hrs), Avg:98.2 F (36.8 C), Min:98.2 F (36.8 C), Max:98.2 F (36.8 C)  Recent Labs  Lab 04/19/23 0129 04/19/23 0715  WBC 7.7 6.6  CREATININE 0.44 0.41*    Estimated Creatinine Clearance: 63.8 mL/min (A) (by C-G formula based on SCr of 0.41 mg/dL (L)).    Allergies  Allergen Reactions   Ace Inhibitors Itching and Swelling   Citrus Hives, Swelling and Other (See Comments)    Pt says she gets knots.    Codeine Nausea And Vomiting    Excessive vomiting   Lisinopril Hives   Other Itching, Swelling and Other (See Comments)    Any type of nut Pt will get mouth blisters   Percocet [Oxycodone-Acetaminophen]     Disoriented, difficulty waking up and hard to focus, vomiting   Propoxyphene Other (See Comments)    Darvocet (Propoxyphene-Acetaminophen).  Causes Disorientation, difficulty waking up, difficulty focusing, and vomiting.   Januvia [Sitagliptin] Itching and Palpitations    Antimicrobials this admission: vancomycin 5/20 >>  metronidazole 5/20 >>  Ceftriaxone 5/19 >> Ampicillin/sulbactam x1 dose on 5/20  Dose adjustments this admission:  Microbiology results: None  Cindi Carbon, PharmD 04/19/2023 9:33  AM

## 2023-04-19 NOTE — Progress Notes (Signed)
Hospitalist Transfer Note:  Transferring facility: [facility] Requesting provider: Dr. Pilar Plate (EDP at [facility]) Reason for transfer: admission for further evaluation and management of preseptal cellulitis of left eye.    66 year old female who presented to [facility] ED complaining of 1 to 2 days of progressive left upper eyelid erythema, swelling, tenderness, without significant involvement of the left lower eyelid, and no periorbital involvement of the right eye.  Denies any associated subjective fever/chills. Denies any acute change in vision, including no blurry vision. She notes that she was diagnosed with bilateral bacterial conjunctivitis 1 month ago, which resolved after completion of ophthalmic drops.   Vital signs in the ED were notable for the following: Afebrile.  Labs were notable for will with cell count 7.7.  Imaging notable for CT orbits with contrast, which showed soft tissue swelling and induration involving the preseptal left periorbital soft tissues concerning for acute preseptal cellulitis with potential early post-septal extension in the absence of any overt evidence of abscess or drainable fluid collection.  [facility] EDP d/w on-call ophthalmology, Dr. Janet Berlin, Who recommended IV antibiotics and conveyed that he would be available as needed if additional consultation is felt to be necessary.   Medications administered prior to transfer included the following: Unasyn.   Subsequently, I accepted this patient for transfer for inpatient admission to a med-surg bed at [facility] or [facility] (first available) for further work-up and management of the above.       Nursing staff, Please call [facility] Admits & Consults System-Wide number on Amion [phone]) as soon as patient's arrival, so appropriate admitting provider can evaluate the pt.     Newton Pigg, DO Hospitalist

## 2023-04-19 NOTE — ED Provider Notes (Signed)
[facility] Emergency Department Provider Note MRN:  [id number]  Arrival date & time: 04/19/23     Chief Complaint   Eye Problem   History of Present Illness   Sharon Munoz is a 67 y.o. year-old female with a history of hypertension, diabetes presenting to the ED with chief complaint of eye problem.  Patient has had left eye swelling and discomfort for the past day or 2.  She was treated for conjunctivitis a month ago and she explains that it did get all the way better.  This is a new issue affecting only 1 time.  She denies fever.  Review of Systems  A thorough review of systems was obtained and all systems are negative except as noted in the HPI and PMH.   Patient's Health History    Past Medical History:  Diagnosis Date   Back spasm    Diabetes mellitus    Hypertension    Systolic ejection murmur 1986    Past Surgical History:  Procedure Laterality Date   CESAREAN SECTION     RIGHT/LEFT HEART CATH AND CORONARY ANGIOGRAPHY N/A 03/08/2020   Procedure: RIGHT/LEFT HEART CATH AND CORONARY ANGIOGRAPHY;  Surgeon: Yvonne Kendall, MD;  Location: [facility] INVASIVE CV LAB;  Service: Cardiovascular;  Laterality: N/A;    Family History  Problem Relation Age of Onset   Hypertension Mother    Asthma Father    Hypertension Father    Kidney cancer Father    CVA Father 20   Asthma Brother    Colon cancer Brother 20   Eczema Neg Hx    Immunodeficiency Neg Hx    Urticaria Neg Hx     Social History   Socioeconomic History   Marital status: Legally Separated    Spouse name: Not on file   Number of children: Not on file   Years of education: Not on file   Highest education level: Not on file  Occupational History   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  Tobacco Use   Smoking status: Never   Smokeless tobacco: Never  Substance and Sexual Activity   Alcohol use: No    Alcohol/week: 0.0 standard drinks of alcohol   Drug use: Never    Sexual activity: Not Currently  Other Topics Concern   Not on file  Social History Narrative   Not on file   Social Determinants of Health   Financial Resource Strain: Not on file  Food Insecurity: Not on file  Transportation Needs: Not on file  Physical Activity: Not on file  Stress: Not on file  Social Connections: Not on file  Intimate Partner Violence: Not on file     Physical Exam   Vitals:   04/19/23 0116  BP: (!) 173/89  Pulse: (!) 59  Resp: 18  Temp: 98.2 F (36.8 C)  SpO2: 99%    CONSTITUTIONAL: Well-appearing, NAD NEURO/PSYCH:  Alert and oriented x 3, no focal deficits EYES:  eyes equal and reactive ENT/NECK:  no LAD, no JVD CARDIO: Regular rate, well-perfused, normal S1 and S2 PULM:  CTAB no wheezing or rhonchi GI/GU:  non-distended, non-tender MSK/SPINE:  No gross deformities, no edema SKIN:  no rash, atraumatic   *Additional and/or pertinent findings included in MDM below  Diagnostic and Interventional Summary    EKG Interpretation  Date/Time:    Ventricular Rate:    PR Interval:    QRS Duration:   QT Interval:    QTC Calculation:  R Axis:     Text Interpretation:         Labs Reviewed  BASIC METABOLIC PANEL - Abnormal; Notable for the following components:      Result Value   Potassium 2.9 (*)    All other components within normal limits  CBC    CT Orbits W Contrast  Final Result      Medications  Ampicillin-Sulbactam (UNASYN) 3 g in sodium chloride 0.9 % 100 mL IVPB (3 g Intravenous New Bag/Given 04/19/23 0451)  ondansetron (ZOFRAN) injection 4 mg (4 mg Intravenous Given 04/19/23 0144)  fentaNYL (SUBLIMAZE) injection 25 mcg (25 mcg Intravenous Given 04/19/23 0144)  cefTRIAXone (ROCEPHIN) 2 g in sodium chloride 0.9 % 100 mL IVPB (0 g Intravenous Stopped 04/19/23 0254)  iohexol (OMNIPAQUE) 300 MG/ML solution 100 mL (75 mLs Intravenous Contrast Given 04/19/23 0252)     Procedures  /  Critical Care Procedures  ED Course and  Medical Decision Making  Initial Impression and Ddx Patient has a prominently swollen and tender left upper eyelid.  Basically her left eye is swollen shut.  Suspicious for preseptal cellulitis.  Able to open the eye manually though with some discomfort.  She has intact visual acuity of this affected eye but she also has pain with extraocular movements, raising some concern for postseptal cellulitis.  Will obtain CT imaging.  Past medical/surgical history that increases complexity of ED encounter: Diabetes  Interpretation of Diagnostics I personally reviewed the laboratory assessment and my interpretation is as follows: No significant blood count or electrolyte disturbance  CT imaging revealing obvious preseptal cellulitis and possibly early postseptal cellulitis.  Patient Reassessment and Ultimate Disposition/Management     Case discussed with Dr. Ranae Pila of ophthalmology, plan is for hospitalist admission for IV antibiotics.  Patient management required discussion with the following services or consulting groups:  Hospitalist Service  Complexity of Problems Addressed Acute illness or injury that poses threat of life of bodily function  Additional Data Reviewed and Analyzed Further history obtained from: Further history from spouse/family member  Additional Factors Impacting ED Encounter Risk Consideration of hospitalization  Elmer Sow. Pilar Plate, MD [facility] Emergency Medicine [facility] [email or web address]  Final Clinical Impressions(s) / ED Diagnoses     ICD-10-CM   1. Cellulitis of left orbital region  H05.012       ED Discharge Orders     None        Discharge Instructions Discussed with and Provided to Patient:   Discharge Instructions   None      Sabas Sous, MD 04/19/23 [phone]

## 2023-04-19 NOTE — ED Triage Notes (Signed)
Pt here from [facility] with c/o  pain and swelling to the left eye that started 2 days ago

## 2023-04-19 NOTE — ED Notes (Signed)
Patient transported to CT 

## 2023-04-19 NOTE — Consult Note (Signed)
Reason for consult:  HPI: Sharon Munoz is an 67 y.o. female we are asked to evaluate further for right sided lid swelling and pain.  She reports  progressive left upper eyelid erythema, swelling, tenderness, without significant involvement of the left lower eyelid    The patient has  hypertension, hyperlipidemia, diabetes type 2 and reports a recent episode of what sounds like conjunctivitis OU which resolved.     No loss of vision.  No diplopia.       Past Medical History:  Diagnosis Date   Back spasm    Diabetes mellitus    Hypertension    Systolic ejection murmur 1986   Past Surgical History:  Procedure Laterality Date   CESAREAN SECTION     RIGHT/LEFT HEART CATH AND CORONARY ANGIOGRAPHY N/A 03/08/2020   Procedure: RIGHT/LEFT HEART CATH AND CORONARY ANGIOGRAPHY;  Surgeon: Yvonne Kendall, MD;  Location: [facility] CV LAB;  Service: Cardiovascular;  Laterality: N/A;   Family History  Problem Relation Age of Onset   Hypertension Mother    Asthma Father    Hypertension Father    Kidney cancer Father    CVA Father 48   Asthma Brother    Colon cancer Brother 49   Eczema Neg Hx    Immunodeficiency Neg Hx    Urticaria Neg Hx    Current Facility-Administered Medications  Medication Dose Route Frequency Provider Last Rate Last Admin   amLODipine (NORVASC) tablet 10 mg  10 mg Oral Daily Crosley, Debby, MD   10 mg at 04/19/23 1002   atenolol (TENORMIN) tablet 50 mg  50 mg Oral BID Crosley, Debby, MD   50 mg at 04/19/23 1002   cefTRIAXone (ROCEPHIN) 2 g in sodium chloride 0.9 % 100 mL IVPB  2 g Intravenous Q24H Regalado, Belkys A, MD       diphenhydrAMINE (BENADRYL) capsule 25 mg  25 mg Oral Q6H PRN Regalado, Belkys A, MD   25 mg at 04/19/23 1258   escitalopram (LEXAPRO) tablet 20 mg  20 mg Oral Daily Crosley, Debby, MD   20 mg at 04/19/23 1002   fluticasone (FLONASE) 50 MCG/ACT nasal spray 1 spray  1 spray Each Nare Daily Crosley, Debby, MD       gabapentin (NEURONTIN) capsule  300 mg  300 mg Oral BID Crosley, Debby, MD   300 mg at 04/19/23 1004   heparin injection 5,000 Units  5,000 Units Subcutaneous Q8H Crosley, Debby, MD   5,000 Units at 04/19/23 1420   insulin aspart (novoLOG) injection 0-15 Units  0-15 Units Subcutaneous TID WC Crosley, Debby, MD   5 Units at 04/19/23 0938   insulin aspart (novoLOG) injection 0-5 Units  0-5 Units Subcutaneous QHS Crosley, Debby, MD       insulin glargine-yfgn (SEMGLEE) injection 15 Units  15 Units Subcutaneous Daily Regalado, Belkys A, MD   15 Units at 04/19/23 1121   metroNIDAZOLE (FLAGYL) IVPB 500 mg  500 mg Intravenous BID Regalado, Belkys A, MD   Stopped at 04/19/23 1122   morphine (PF) 2 MG/ML injection 2 mg  2 mg Intravenous Q4H PRN Crosley, Debby, MD       ondansetron (ZOFRAN) tablet 4 mg  4 mg Oral Q6H PRN Crosley, Debby, MD       Or   ondansetron (ZOFRAN) injection 4 mg  4 mg Intravenous Q6H PRN Crosley, Debby, MD       pravastatin (PRAVACHOL) tablet 20 mg  20 mg Oral Daily Gery Pray, MD  20 mg at 04/19/23 1002   senna-docusate (Senokot-S) tablet 1 tablet  1 tablet Oral QHS PRN Gery Pray, MD       [START ON 04/20/2023] vancomycin (VANCOREADY) IVPB 1250 mg/250 mL  1,250 mg Intravenous Q24H Cindi Carbon, RPH       Allergies  Allergen Reactions   Ace Inhibitors Itching and Swelling   Citrus Hives, Swelling and Other (See Comments)    Pt says she gets knots.    Codeine Nausea And Vomiting    Excessive vomiting   Lisinopril Hives   Other Itching, Swelling and Other (See Comments)    Any type of nut Pt will get mouth blisters   Percocet [Oxycodone-Acetaminophen]     Disoriented, difficulty waking up and hard to focus, vomiting   Propoxyphene Other (See Comments)    Darvocet (Propoxyphene-Acetaminophen).  Causes Disorientation, difficulty waking up, difficulty focusing, and vomiting.   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  Tobacco Use   Smoking status: Never   Smokeless tobacco: Never  Substance and Sexual Activity   Alcohol use: No    Alcohol/week: 0.0 standard drinks of alcohol   Drug use: Never   Sexual activity: Not Currently  Other Topics Concern   Not on file  Social History Narrative   Not on file   Social Determinants of Health   Financial Resource Strain: Not on file  Food Insecurity: No Food Insecurity (04/19/2023)   Hunger Vital Sign    Worried About Running Out of Food in the Last Year: Never true    Ran Out of Food in the Last Year: Never true  Transportation Needs: No Transportation Needs (04/19/2023)   PRAPARE - Administrator, Civil Service (Medical): No    Lack of Transportation (Non-Medical): No  Physical Activity: Not on file  Stress: Not on file  Social Connections: Not on file  Intimate Partner Violence: Not At Risk (04/19/2023)   Humiliation, Afraid, Rape, and Kick questionnaire    Fear of Current or Ex-Partner: No    Emotionally Abused: No    Physically Abused: No    Sexually Abused: No    Review of systems: Per progress notes.    Physical Exam:  Blood pressure 121/69, pulse 65, temperature 98.3 F (36.8 C), temperature source Oral, resp. rate 17, height 5\' 3"  (1.6 m), weight 67.4 kg, SpO2 99 %.   [facility] [place]   OD 20/50  OS  20/60-  Pupils:   OD round, reactive to light, no APD            OS round, reactive to light, no APD  IOP (T pen)  OD 11   OS  13  CVF: OD full to CF   OS full to CF  Motility:  OD full ductions  OS full ductions  Balance/alignment:  Ortho by Phoebe Perch   Slit lamp examination:                                 OD  External/adnexa: Normal                                       Lids/lashes:        Normal                                      Conjunctiva        White, quiet        Cornea:              Clear                  AC:                     Deep, quiet                                Iris:                     Normal        Lens:                  Clear                                       OS                                       External/adnexa: 2 + periorbital edema; no mass/no induration.                                     Lids/lashes:        2+ lid edema; none of it taught                                  Conjunctiva        White, quiet        Cornea:              Clear                  AC:                     Deep, quiet                                Iris:                     Normal        Lens:                  Clear         Labs/studies: Results for orders placed or performed during the hospital encounter of 04/19/23 (from the past 48 hour(s))  CBC     Status: None   Collection Time: 04/19/23  1:29 AM  Result Value Ref Range   WBC 7.7 4.0 - 10.5 K/uL   RBC 4.78 3.87 - 5.11 MIL/uL   Hemoglobin 14.1 12.0 - 15.0 g/dL   HCT 16.1 09.6 - 04.5 %   MCV 90.6 80.0 - 100.0 fL   MCH 29.5 26.0 - 34.0 pg   MCHC 32.6 30.0 - 36.0 g/dL   RDW 40.9 81.1 - 91.4 %   Platelets 255 150 - 400 K/uL   nRBC 0.0 0.0 - 0.2 %    Comment: Performed at [facility], [address] [place], [place] [address]  Basic metabolic panel     Status: Abnormal   Collection Time: 04/19/23  1:29 AM  Result Value Ref Range   Sodium 142 135 - 145 mmol/L   Potassium 2.9 (L) 3.5 - 5.1 mmol/L   Chloride 105 98 - 111 mmol/L   CO2 28 22 - 32 mmol/L   Glucose, Bld 94 70 - 99 mg/dL    Comment: Glucose reference range applies only to samples taken after fasting for at least 8 hours.   BUN 12 8 - 23 mg/dL   Creatinine, Ser 6.21 0.44 - 1.00 mg/dL   Calcium 9.2 8.9 - 30.8 mg/dL   GFR, Estimated >65 >78 mL/min    Comment: (NOTE) Calculated using the  CKD-EPI Creatinine Equation (2021)    Anion gap 9 5 - 15    Comment: Performed at [facility], [address] [place], [place], [place] [address]  CBC     Status: None   Collection Time: 04/19/23  7:15 AM  Result Value Ref Range   WBC 6.6 4.0 - 10.5 K/uL   RBC 4.64 3.87 - 5.11 MIL/uL   Hemoglobin 13.8 12.0 - 15.0 g/dL   HCT 95.2 84.1 - 32.4 %   MCV 91.4 80.0 - 100.0 fL   MCH 29.7 26.0 - 34.0 pg   MCHC 32.5 30.0 - 36.0 g/dL   RDW 40.1 02.7 - 25.3 %   Platelets 241 150 - 400 K/uL   nRBC 0.0 0.0 - 0.2 %    Comment: Performed at [facility], [address], [place], [place] [address]  Creatinine, serum     Status: Abnormal   Collection Time: 04/19/23  7:15 AM  Result Value Ref Range   Creatinine, Ser 0.41 (L) 0.44 - 1.00 mg/dL   GFR, Estimated >34 >74 mL/min    Comment: (NOTE) Calculated using the CKD-EPI Creatinine Equation (2021) Performed at [facility], [address], [place], [place] [address]   Hemoglobin A1c     Status: Abnormal   Collection Time: 04/19/23  7:15 AM  Result Value Ref Range   Hgb A1c MFr Bld 7.6 (H) 4.8 - 5.6 %    Comment: (NOTE) Pre diabetes:          5.7%-6.4%  Diabetes:              >6.4%  Glycemic control for   <7.0% adults with diabetes    Mean Plasma Glucose 171.42 mg/dL    Comment: Performed at [facility], [address], [place], [place] [address]  Glucose, capillary     Status: Abnormal   Collection Time: 04/19/23  7:56 AM  Result Value Ref Range   Glucose-Capillary 53 (L) 70 - 99 mg/dL    Comment: Glucose reference range applies only to samples taken after fasting for at least 8 hours.  Glucose, capillary     Status: Abnormal  Collection Time: 04/19/23  8:25 AM  Result Value Ref Range   Glucose-Capillary 181 (H) 70 - 99 mg/dL    Comment: Glucose reference range applies only to samples taken after fasting for at least 8 hours.  Basic metabolic panel     Status: Abnormal    Collection Time: 04/19/23  9:10 AM  Result Value Ref Range   Sodium 140 135 - 145 mmol/L   Potassium 2.9 (L) 3.5 - 5.1 mmol/L   Chloride 106 98 - 111 mmol/L   CO2 23 22 - 32 mmol/L   Glucose, Bld 224 (H) 70 - 99 mg/dL    Comment: Glucose reference range applies only to samples taken after fasting for at least 8 hours.   BUN 10 8 - 23 mg/dL   Creatinine, Ser 1.61 0.44 - 1.00 mg/dL   Calcium 8.6 (L) 8.9 - 10.3 mg/dL   GFR, Estimated >09 >60 mL/min    Comment: (NOTE) Calculated using the CKD-EPI Creatinine Equation (2021)    Anion gap 11 5 - 15    Comment: Performed at [facility], [address] [place], [place] [address]  Magnesium     Status: None   Collection Time: 04/19/23  9:10 AM  Result Value Ref Range   Magnesium 1.8 1.7 - 2.4 mg/dL    Comment: Performed at [facility], [address], [place], [place] [address]  Glucose, capillary     Status: Abnormal   Collection Time: 04/19/23  9:23 AM  Result Value Ref Range   Glucose-Capillary 237 (H) 70 - 99 mg/dL    Comment: Glucose reference range applies only to samples taken after fasting for at least 8 hours.  Glucose, capillary     Status: None   Collection Time: 04/19/23 11:35 AM  Result Value Ref Range   Glucose-Capillary 94 70 - 99 mg/dL    Comment: Glucose reference range applies only to samples taken after fasting for at least 8 hours.  Glucose, capillary     Status: Abnormal   Collection Time: 04/19/23  4:14 PM  Result Value Ref Range   Glucose-Capillary 57 (L) 70 - 99 mg/dL    Comment: Glucose reference range applies only to samples taken after fasting for at least 8 hours.  Glucose, capillary     Status: Abnormal   Collection Time: 04/19/23  5:20 PM  Result Value Ref Range   Glucose-Capillary 144 (H) 70 - 99 mg/dL    Comment: Glucose reference range applies only to samples taken after fasting for at least 8 hours.   CT Orbits W Contrast  Result Date: 04/19/2023 CLINICAL  DATA:  Initial evaluation for pain and swelling, periorbital cellulitis. EXAM: CT ORBITS WITH CONTRAST TECHNIQUE: Multidetector CT images was performed according to the standard protocol following intravenous contrast administration. RADIATION DOSE REDUCTION: This exam was performed according to the departmental dose-optimization program which includes automated exposure control, adjustment of the mA and/or kV according to patient size and/or use of iterative reconstruction technique. CONTRAST:  75mL OMNIPAQUE IOHEXOL 300 MG/ML  SOLN COMPARISON:  None Available. FINDINGS: Orbits: Globes are symmetric in size with normal appearance and morphology. Optic nerves symmetric and normal. On the left, there is subtle stranding involving the extraconal fat at the superior left orbit, suspicious for possible early postseptal extension of infection (series 5, image 56). Intraconal and extraconal fat otherwise well-maintained. Lacrimal glands within normal limits. No abnormality about the orbital apices or cavernous sinus. Superior orbital veins symmetric and within  normal limits. Visible paranasal sinuses: Scattered mucoperiosteal thickening present about the ethmoidal air cells. Paranasal sinuses are otherwise clear. Visualized mastoids and middle ear cavities are well pneumatized and free of fluid. Soft tissues: Soft tissue swelling with induration involving the preseptal left periorbital soft tissues, concerning for acute preseptal cellulitis. No discrete abscess or drainable fluid collection. Again, no convincing evidence for intraorbital or postseptal extension at this time. Remainder the visualized facial soft tissues demonstrate no other acute finding. Osseous: No acute osseous finding. Limited intracranial: Unremarkable. IMPRESSION: Soft tissue swelling with induration involving the preseptal left periorbital soft tissues, concerning for acute preseptal cellulitis. Subtle stranding involving the extraconal fat at the  superior left orbit, suspicious for possible early postseptal extension. No discrete abscess or drainable fluid collection. Electronically Signed   By: Rise Mu M.D.   On: 04/19/2023 03:37                             Assessment and Plan: Sharon Munoz is an 67 y.o. female we are asked to evaluate further for right sided lid swelling and pain with :   -- Preseptal cellulitis OS and some CT findings of orbital fat stranding OS.   Clinical exam is most fitting with preseptal cellulitis OS.  Her vision, pupil, motility are all intact.  No proptosis.    Agree with IV ABX.   Recommend:   -- Continue IV ABX -- Keep head of bed elevated to help soft tissue edema -- Cool compresses over OS PRN.  -- Follow clinical exam   I would expect the patient to improve clinically over the next 24-36 hrs.  If she does not improve or if she worsens (worsening pain, worsening edema, or proptosis) then low threshold to repeat CT Orbits.  If she improves over that time period then could be discharged on PO ABX.       All of the above information was relayed to the patient and/or patient family.  Ophthalmic warning signs and symptoms were reviewed, and clear instructions for immediate phone contact and/or immediate return to the ED or clinic were provided should any of these signs or symptoms occur.  Follow up contact information was provided.  All questions were answered.   Antony Contras 04/19/2023, 5:24 PM  [facility] Ophthalmology [phone]

## 2023-04-19 NOTE — Progress Notes (Signed)
PROGRESS NOTE    Sharon Munoz  ZOX:[id number] DOB: 13-Aug-1956 DOA: 04/19/2023 PCP: [name], MD   Brief Narrative: 67 year old with past medical history significant for hypertension, hyperlipidemia, diabetes type 2 well-controlled wake up this morning with left eye lesion really bad.  She noticed that her eyes was swollen.  She cleaned her eye with water, the swelling and the itching continued to get worse.  She presented to the ED and a CT orbit shows soft tissue swelling and induration involving the preseptal left periorbital soft tissue concerning for acute preseptal cellulitis and potential early postseptal extension in the absence of any overt evidence of abscess or drainable fluid collection.  Case was discussed with Dr. Fransisca Connors, recommended IV antibiotics.  Patient seen this morning she reported worsening swelling, she has only received 1 dose of IV antibiotics, I have consulted [facility] ophthalmology.   Assessment & Plan:   Principal Problem:   Facial cellulitis Active Problems:   Diabetes mellitus (HCC)   Dyslipidemia   Essential hypertension  1-Left Preseptal Cellulitis: -Presents with swelling left eye. CT orbit: Soft tissue swelling with induration involving the preseptal left periorbital soft tissues, concerning for acute preseptal cellulitis. Subtle stranding involving the extraconal fat at the superior left orbit, suspicious for possible early postseptal extension. -She report edema is worse this am.  -She has received one dose of IV Unasyn. I have change antibiotics to IV Vancomycin, Ceftriaxone and Flagyl.  -Ophthalmology consulted, called office of Dr Janet Berlin.  -PRN benadryl for itching.   2-Hypokalemia;  Replete orally with 40 meq times 2.   3-DM type 2; hypoglycemia:  SSI Suspect hypoglycemia secondary to poor oral intake yesterday.  Will resume tresiba lower home dose.   Dyslipidemia;  Continue pravastatin and omega.     Estimated body mass index is 26.32 kg/m as calculated from the following:   Height as of this encounter: 5\' 3"  (1.6 m).   Weight as of this encounter: 67.4 kg.   DVT prophylaxis: Heparin  Code Status: Full code Family Communication: Care discussed with patient Disposition Plan:  Status is: Inpatient Remains inpatient appropriate because: management of preseptal cellulitis.     Consultants:  Ophthalmology   Procedures:    Antimicrobials:    Subjective: She report worsening swelling this am. Report itching  She didn't eat much yesterday. She took tresiba yesterday.   Objective: Vitals:   04/19/23 0116 04/19/23 0557 04/19/23 0601  BP: (!) 173/89 135/70 135/70  Pulse: (!) 59 61 61  Resp: 18 18 18   Temp: 98.2 F (36.8 C) 98.2 F (36.8 C) 98.2 F (36.8 C)  TempSrc: Oral Oral Oral  SpO2: 99% 98%   Weight:   67.4 kg  Height:   5\' 3"  (1.6 m)    Intake/Output Summary (Last 24 hours) at 04/19/2023 0919 Last data filed at 04/19/2023 0800 Gross per 24 hour  Intake 680 ml  Output --  Net 680 ml   Filed Weights   04/19/23 0601  Weight: 67.4 kg    Examination:  General exam: Appears calm and comfortable  Respiratory system: Clear to auscultation. Respiratory effort normal. Cardiovascular system: S1 & S2 heard, RRR. No JVD, murmurs, rubs, gallops or clicks. No pedal edema. Gastrointestinal system: Abdomen is nondistended, soft and nontender. No organomegaly or masses felt. Normal bowel sounds heard. Central nervous system: Alert and oriented. Extremities: Symmetric 5 x 5 power.   Data Reviewed: I have personally reviewed following labs and imaging studies  CBC: Recent Labs  Lab 04/19/23 0129 04/19/23 0715  WBC 7.7 6.6  HGB 14.1 13.8  HCT 43.3 42.4  MCV 90.6 91.4  PLT 255 241   Basic Metabolic Panel: Recent Labs  Lab 04/19/23 0129 04/19/23 0715  NA 142  --   K 2.9*  --   CL 105  --   CO2 28  --   GLUCOSE 94  --   BUN 12  --   CREATININE 0.44  0.41*  CALCIUM 9.2  --    GFR: Estimated Creatinine Clearance: 63.8 mL/min (A) (by C-G formula based on SCr of 0.41 mg/dL (L)). Liver Function Tests: No results for input(s): "AST", "ALT", "ALKPHOS", "BILITOT", "PROT", "ALBUMIN" in the last 168 hours. No results for input(s): "LIPASE", "AMYLASE" in the last 168 hours. No results for input(s): "AMMONIA" in the last 168 hours. Coagulation Profile: No results for input(s): "INR", "PROTIME" in the last 168 hours. Cardiac Enzymes: No results for input(s): "CKTOTAL", "CKMB", "CKMBINDEX", "TROPONINI" in the last 168 hours. BNP (last 3 results) No results for input(s): "PROBNP" in the last 8760 hours. HbA1C: No results for input(s): "HGBA1C" in the last 72 hours. CBG: Recent Labs  Lab 04/19/23 0756 04/19/23 0825  GLUCAP 53* 181*   Lipid Profile: No results for input(s): "CHOL", "HDL", "LDLCALC", "TRIG", "CHOLHDL", "LDLDIRECT" in the last 72 hours. Thyroid Function Tests: No results for input(s): "TSH", "T4TOTAL", "FREET4", "T3FREE", "THYROIDAB" in the last 72 hours. Anemia Panel: No results for input(s): "VITAMINB12", "FOLATE", "FERRITIN", "TIBC", "IRON", "RETICCTPCT" in the last 72 hours. Sepsis Labs: No results for input(s): "PROCALCITON", "LATICACIDVEN" in the last 168 hours.  No results found for this or any previous visit (from the past 240 hour(s)).       Radiology Studies: CT Orbits W Contrast  Result Date: 04/19/2023 CLINICAL DATA:  Initial evaluation for pain and swelling, periorbital cellulitis. EXAM: CT ORBITS WITH CONTRAST TECHNIQUE: Multidetector CT images was performed according to the standard protocol following intravenous contrast administration. RADIATION DOSE REDUCTION: This exam was performed according to the departmental dose-optimization program which includes automated exposure control, adjustment of the mA and/or kV according to patient size and/or use of iterative reconstruction technique. CONTRAST:  75mL  OMNIPAQUE IOHEXOL 300 MG/ML  SOLN COMPARISON:  None Available. FINDINGS: Orbits: Globes are symmetric in size with normal appearance and morphology. Optic nerves symmetric and normal. On the left, there is subtle stranding involving the extraconal fat at the superior left orbit, suspicious for possible early postseptal extension of infection (series 5, image 56). Intraconal and extraconal fat otherwise well-maintained. Lacrimal glands within normal limits. No abnormality about the orbital apices or cavernous sinus. Superior orbital veins symmetric and within normal limits. Visible paranasal sinuses: Scattered mucoperiosteal thickening present about the ethmoidal air cells. Paranasal sinuses are otherwise clear. Visualized mastoids and middle ear cavities are well pneumatized and free of fluid. Soft tissues: Soft tissue swelling with induration involving the preseptal left periorbital soft tissues, concerning for acute preseptal cellulitis. No discrete abscess or drainable fluid collection. Again, no convincing evidence for intraorbital or postseptal extension at this time. Remainder the visualized facial soft tissues demonstrate no other acute finding. Osseous: No acute osseous finding. Limited intracranial: Unremarkable. IMPRESSION: Soft tissue swelling with induration involving the preseptal left periorbital soft tissues, concerning for acute preseptal cellulitis. Subtle stranding involving the extraconal fat at the superior left orbit, suspicious for possible early postseptal extension. No discrete abscess or drainable fluid collection. Electronically Signed   By: Rise Mu M.D.   On: 04/19/2023  03:37        Scheduled Meds:  amLODipine  10 mg Oral Daily   atenolol  50 mg Oral BID   escitalopram  20 mg Oral Daily   fluticasone  1 spray Each Nare Daily   gabapentin  300 mg Oral BID   heparin  5,000 Units Subcutaneous Q8H   hydrOXYzine  25 mg Oral TID   insulin aspart  0-15 Units  Subcutaneous TID WC   insulin aspart  0-5 Units Subcutaneous QHS   pravastatin  20 mg Oral Daily   Continuous Infusions:  cefTRIAXone (ROCEPHIN)  IV     metronidazole       LOS: 0 days    Time spent: 35 minutes    Sullivan Jacuinde A Holton Sidman, MD [facility]   If 7PM-7AM, please contact night-coverage [email or web address]  04/19/2023, 9:19 AM

## 2023-04-19 NOTE — H&P (Signed)
PCP:   Sharon Garbe, MD   Chief Complaint:  Left eye infection  HPI: This is a pleasant 67 year old female with past medical history of HTN, HLD and diabetes mellitus x 2 well-controlled.  Two days ago she woke at 6 AM with her left eye itching really bad.  When she looked at it, it was swollen.  She states she cleaned it out really good with water, however, as the day progressed the swelling and itching continued.  She found she was unable to see or read.  Light does bother her eyes.  She denies fevers or chills.  Approximately 1 month ago she had something similar, both eyes were red and infected but it was not this severe.  She was treated by an ophthalmologist who numb and cleaned both eyes.  She was given a prescription for eye wash and an ointment.  The infection completely resolved.  The patient states her diabetes is typically well-controlled with POCT ranging between 109-116.  She has no history of MRSA infections.    At [facility] ER CT orbits with contrast revealed Soft tissue swelling and induration involving the preseptal left periorbital soft tissues concerning for acute preseptal cellulitis with potential early post-septal extension in the absence of any overt evidence of abscess or drainable fluid collection.  EDP at [facility] contacted ophthalmologist on-call Dr. Janet Munoz.  He recommended IV antibiotics and states he is available for additional consultation if necessary  Review of Systems:  Per HPI  Past Medical History: Past Medical History:  Diagnosis Date   Back spasm    Diabetes mellitus    Hypertension    Systolic ejection murmur 1986   Past Surgical History:  Procedure Laterality Date   CESAREAN SECTION     RIGHT/LEFT HEART CATH AND CORONARY ANGIOGRAPHY N/A 03/08/2020   Procedure: RIGHT/LEFT HEART CATH AND CORONARY ANGIOGRAPHY;  Surgeon: Sharon Kendall, MD;  Location: [facility] INVASIVE CV LAB;  Service: Cardiovascular;  Laterality: N/A;    Medications: Prior to  Admission medications   Medication Sig Start Date End Date Taking? Authorizing Provider  acetaminophen (TYLENOL) 500 MG tablet Take 1,000 mg by mouth every 6 (six) hours as needed for mild pain.    [provider]  amLODipine (NORVASC) 10 MG tablet Take 1 tablet (10 mg total) by mouth daily. 10/21/21   Sharon Asters, NP  Ascorbic Acid (VITAMIN C) 500 MG CHEW daily at 6 (six) AM.    [provider]  aspirin EC 81 MG tablet Take 81 mg by mouth daily.    [provider]  atenolol (TENORMIN) 50 MG tablet Take 1 tablet (50 mg total) by mouth 2 (two) times daily. 10/21/21   Sharon Asters, NP  benzonatate (TESSALON) 100 MG capsule Take 1 capsule (100 mg total) by mouth every 8 (eight) hours as needed for cough. 12/27/22   Sharon Bryant, FNP  Calcium Carb-Cholecalciferol (CALCIUM 500 + D3) 500-15 MG-MCG TABS 2 (two) times a week.    [provider]  celecoxib (CELEBREX) 200 MG capsule Take 1 capsule (200 mg total) by mouth daily as needed. 12/27/22   Sharon Bryant, FNP  cetirizine (ZYRTEC) 10 MG chewable tablet Chew by mouth as needed for allergies. 03/06/20   [provider]  Cholecalciferol (VITAMIN D3 PO) Take 1 capsule by mouth 4 (four) times a week. [employer] Brand    [provider]  empagliflozin (JARDIANCE) 25 MG TABS tablet Take 1 tablet (25 mg total) by mouth daily. 11/20/22  Sharon Asters, NP  escitalopram (LEXAPRO) 20 MG tablet Take 1 tablet (20 mg total) by mouth daily. 03/09/20 04/12/23  Sharon Mura, MD  fluticasone (FLONASE) 50 MCG/ACT nasal spray Place 1 spray into both nostrils daily. 12/27/22   Sharon Bryant, FNP  gabapentin (NEURONTIN) 100 MG capsule Take 3 capsules (300 mg total) by mouth 3 (three) times daily. 02/12/16   Sharon Feinstein, MD  gatifloxacin (ZYMAXID) 0.5 % SOLN Place into both eyes daily at 6 (six) AM. 04/08/23   [provider]  hydrochlorothiazide (MICROZIDE) 12.5 MG capsule Take 12.5 mg by mouth  daily. 04/06/23   [provider]  hydrocortisone cream 0.5 % Apply 1 Application topically 2 (two) times daily. Apply to area on face.  Do not get in your eyes. 02/07/23   Sharon Bryant, FNP  ibandronate (BONIVA) 150 MG tablet every 30 (thirty) days.    [provider]  ketorolac (ACULAR) 0.5 % ophthalmic solution Place 1 drop into the right eye 4 (four) times daily. 04/06/23   [provider]  KLOR-CON M10 10 MEQ tablet Take 10 mEq by mouth 3 (three) times a week. For 30 days 02/11/22   [provider]  levocetirizine (XYZAL) 5 MG tablet as needed for allergies.    [provider]  Magnesium 250 MG TABS daily.    [provider]  Multiple Vitamins-Minerals (HAIR SKIN NAILS PO) Take 1 capsule by mouth 3 (three) times a week.    [provider]  Omega-3 Fatty Acids (FISH OIL) 1000 MG CAPS Take 1,000 mg by mouth 2 (two) times a week.    [provider]  pravastatin (PRAVACHOL) 20 MG tablet TAKE 1 TABLET BY MOUTH EVERY DAY 12/14/22   Sharon Asters, NP  prednisoLONE acetate (PRED FORTE) 1 % ophthalmic suspension Place into both eyes daily at 6 (six) AM. 04/06/23   [provider]  TRESIBA FLEXTOUCH 100 UNIT/ML FlexTouch Pen Inject 36 Units into the skin daily. 06/20/20   [provider]  triamcinolone cream (KENALOG) 0.1 % Apply 1 Application topically 2 (two) times daily. Apply to affected areas.  Do not apply to face. 02/07/23   Sharon Bryant, FNP    Allergies:   Allergies  Allergen Reactions   Ace Inhibitors Itching and Swelling   Citrus Hives, Swelling and Other (See Comments)    Pt says she gets knots.    Codeine Nausea And Vomiting    Excessive vomiting   Lisinopril Hives   Other Itching, Swelling and Other (See Comments)    Any type of nut Pt will get mouth blisters   Percocet [Oxycodone-Acetaminophen]     Disoriented, difficulty waking up and hard to focus, vomiting   Propoxyphene Other (See  Comments)    Darvocet (Propoxyphene-Acetaminophen).  Causes Disorientation, difficulty waking up, difficulty focusing, and vomiting.   Januvia [Sitagliptin] Itching and Palpitations    Social History:  reports that she has never smoked. She has never used smokeless tobacco. She reports that she does not drink alcohol and does not use drugs.  Family History: Family History  Problem Relation Age of Onset   Hypertension Mother    Asthma Father    Hypertension Father    Kidney cancer Father    CVA Father 15   Asthma Brother    Colon cancer Brother 43   Eczema Neg Hx    Immunodeficiency Neg Hx    Urticaria Neg Hx     Physical Exam: Vitals:  04/19/23 0116 04/19/23 0557 04/19/23 0601  BP: (!) 173/89 135/70 135/70  Pulse: (!) 59 61 61  Resp: 18 18 18   Temp: 98.2 F (36.8 C) 98.2 F (36.8 C) 98.2 F (36.8 C)  TempSrc: Oral Oral Oral  SpO2: 99% 98%   Weight:   67.4 kg  Height:   5\' 3"  (1.6 m)    General:  Alert and oriented times three, well developed and nourished, no acute distress Eyes: Eye swollen shut.  Unable to open ENT: Moist oral mucosa, neck supple, no thyromegaly Lungs: clear to ascultation, no wheeze, no crackles, no use of accessory muscles Cardiovascular: regular rate and rhythm, no regurgitation, no gallops, no murmurs. No carotid bruits, no JVD Abdomen: soft, positive BS, non-tender, non-distended, no organomegaly, not an acute abdomen GU: not examined Neuro: CN II - XII grossly intact, sensation intact Musculoskeletal: strength 5/5 all extremities, no clubbing, cyanosis or edema Skin: no rash, no subcutaneous crepitation, no decubitus Psych: appropriate patient   Labs on Admission:  Recent Labs    04/19/23 0129  NA 142  K 2.9*  CL 105  CO2 28  GLUCOSE 94  BUN 12  CREATININE 0.44  CALCIUM 9.2    Recent Labs    04/19/23 0129  WBC 7.7  HGB 14.1  HCT 43.3  MCV 90.6  PLT 255    Radiological Exams on Admission: CT Orbits W  Contrast  Result Date: 04/19/2023 CLINICAL DATA:  Initial evaluation for pain and swelling, periorbital cellulitis. EXAM: CT ORBITS WITH CONTRAST TECHNIQUE: Multidetector CT images was performed according to the standard protocol following intravenous contrast administration. RADIATION DOSE REDUCTION: This exam was performed according to the departmental dose-optimization program which includes automated exposure control, adjustment of the mA and/or kV according to patient size and/or use of iterative reconstruction technique. CONTRAST:  75mL OMNIPAQUE IOHEXOL 300 MG/ML  SOLN COMPARISON:  None Available. FINDINGS: Orbits: Globes are symmetric in size with normal appearance and morphology. Optic nerves symmetric and normal. On the left, there is subtle stranding involving the extraconal fat at the superior left orbit, suspicious for possible early postseptal extension of infection (series 5, image 56). Intraconal and extraconal fat otherwise well-maintained. Lacrimal glands within normal limits. No abnormality about the orbital apices or cavernous sinus. Superior orbital veins symmetric and within normal limits. Visible paranasal sinuses: Scattered mucoperiosteal thickening present about the ethmoidal air cells. Paranasal sinuses are otherwise clear. Visualized mastoids and middle ear cavities are well pneumatized and free of fluid. Soft tissues: Soft tissue swelling with induration involving the preseptal left periorbital soft tissues, concerning for acute preseptal cellulitis. No discrete abscess or drainable fluid collection. Again, no convincing evidence for intraorbital or postseptal extension at this time. Remainder the visualized facial soft tissues demonstrate no other acute finding. Osseous: No acute osseous finding. Limited intracranial: Unremarkable. IMPRESSION: Soft tissue swelling with induration involving the preseptal left periorbital soft tissues, concerning for acute preseptal cellulitis. Subtle  stranding involving the extraconal fat at the superior left orbit, suspicious for possible early postseptal extension. No discrete abscess or drainable fluid collection. Electronically Signed   By: Rise Mu M.D.   On: 04/19/2023 03:37    Assessment/Plan Present on Admission:  Left periorbital cellulitis -IV Unasyn initiated in ED, continued -Pain medication as needed -[facility] EDP d/w on-call ophthalmology, Dr. Janet Munoz, Who recommended IV antibiotics and conveyed that he would be available as needed if additional consultation is felt to be necessary.  -Hydroxyzine as needed itching   Hypokalemia -Repleting  IV -BMP in a.m.   DM type 2/peripheral neuropathy -Sliding scale insulin -Continue Tresiba -Resume Neurontin   Essential hypertension -Stable, resume atenolol, HCTZ   Dyslipidemia -Resume pravastatin and omega-3 fatty acids   Depression -Resume Lexapro  Sharon Munoz 04/19/2023, 6:08 AM

## 2023-04-19 NOTE — Progress Notes (Signed)
Pharmacy Antibiotic Note  Sharon Munoz is a 67 y.o. female admitted on 04/19/2023 with cellulitis.  Pharmacy has been consulted for Unasyn dosing.  Plan: Unasyn 3 gr IV q6h  Monitor clinical course, renal function, cultures as available   Height: 5\' 3"  (160 cm) Weight: 67.4 kg (148 lb 9.4 oz) IBW/kg (Calculated) : 52.4  Temp (24hrs), Avg:98.2 F (36.8 C), Min:98.2 F (36.8 C), Max:98.2 F (36.8 C)  Recent Labs  Lab 04/19/23 0129  WBC 7.7  CREATININE 0.44    Estimated Creatinine Clearance: 63.8 mL/min (by C-G formula based on SCr of 0.44 mg/dL).    Allergies  Allergen Reactions   Ace Inhibitors Itching and Swelling   Citrus Hives, Swelling and Other (See Comments)    Pt says she gets knots.    Codeine Nausea And Vomiting    Excessive vomiting   Lisinopril Hives   Other Itching, Swelling and Other (See Comments)    Any type of nut Pt will get mouth blisters   Percocet [Oxycodone-Acetaminophen]     Disoriented, difficulty waking up and hard to focus, vomiting   Propoxyphene Other (See Comments)    Darvocet (Propoxyphene-Acetaminophen).  Causes Disorientation, difficulty waking up, difficulty focusing, and vomiting.   Januvia [Sitagliptin] Itching and Palpitations     Dosage will likely remain stable at above dosage and need for further dosage adjustment appears unlikely at present.    Will sign off at this time.  Please reconsult if a change in clinical status warrants re-evaluation of dosage.    Thank you for allowing pharmacy to be a part of this patient's care.   Adalberto Cole, PharmD, BCPS 04/19/2023 7:02 AM

## 2023-04-20 LAB — CBC WITH DIFFERENTIAL/PLATELET
Abs Immature Granulocytes: 0.02 10*3/uL (ref 0.00–0.07)
Basophils Absolute: 0 10*3/uL (ref 0.0–0.1)
Basophils Relative: 1 %
Eosinophils Absolute: 0.4 10*3/uL (ref 0.0–0.5)
Eosinophils Relative: 6 %
HCT: 47.6 % — ABNORMAL HIGH (ref 36.0–46.0)
Hemoglobin: 15 g/dL (ref 12.0–15.0)
Immature Granulocytes: 0 %
Lymphocytes Relative: 34 %
Lymphs Abs: 2 10*3/uL (ref 0.7–4.0)
MCH: 29.2 pg (ref 26.0–34.0)
MCHC: 31.5 g/dL (ref 30.0–36.0)
MCV: 92.8 fL (ref 80.0–100.0)
Monocytes Absolute: 0.5 10*3/uL (ref 0.1–1.0)
Monocytes Relative: 9 %
Neutro Abs: 3 10*3/uL (ref 1.7–7.7)
Neutrophils Relative %: 50 %
Platelets: 267 10*3/uL (ref 150–400)
RBC: 5.13 MIL/uL — ABNORMAL HIGH (ref 3.87–5.11)
RDW: 14.1 % (ref 11.5–15.5)
WBC: 5.9 10*3/uL (ref 4.0–10.5)
nRBC: 0 % (ref 0.0–0.2)

## 2023-04-20 LAB — BASIC METABOLIC PANEL
Anion gap: 7 (ref 5–15)
BUN: 13 mg/dL (ref 8–23)
CO2: 26 mmol/L (ref 22–32)
Calcium: 9.1 mg/dL (ref 8.9–10.3)
Chloride: 108 mmol/L (ref 98–111)
Creatinine, Ser: 0.6 mg/dL (ref 0.44–1.00)
GFR, Estimated: >60 mL/min (ref >60)
Glucose, Bld: 88 mg/dL (ref 70–99)
Potassium: 3.6 mmol/L (ref 3.5–5.1)
Sodium: 141 mmol/L (ref 135–145)

## 2023-04-20 LAB — GLUCOSE, CAPILLARY
Glucose-Capillary: 115 mg/dL — ABNORMAL HIGH (ref 70–99)
Glucose-Capillary: 116 mg/dL — ABNORMAL HIGH (ref 70–99)
Glucose-Capillary: 147 mg/dL — ABNORMAL HIGH (ref 70–99)
Glucose-Capillary: 208 mg/dL — ABNORMAL HIGH (ref 70–99)

## 2023-04-20 MED ORDER — POTASSIUM CHLORIDE CRYS ER 20 MEQ PO TBCR
20.0000 meq | EXTENDED_RELEASE_TABLET | Freq: Once | ORAL | Status: AC
  Administered 2023-04-20: 20 meq via ORAL
  Filled 2023-04-20: qty 1

## 2023-04-20 NOTE — Progress Notes (Signed)
  Transition of Care [facility]) Screening Note   Patient Details  Name: Sharon Munoz Date of Birth: 1956-11-11   Transition of Care [facility]) CM/SW Contact:    Otelia Santee, LCSW Phone Number: 04/20/2023, 9:04 AM    Transition of Care Department [facility]) has reviewed patient and no TOC needs have been identified at this time. We will continue to monitor patient advancement through interdisciplinary progression rounds. If new patient transition needs arise, please place a TOC consult.

## 2023-04-20 NOTE — Progress Notes (Signed)
PROGRESS NOTE    Sharon Munoz  ZOX:[id number] DOB: 10/06/1956 DOA: 04/19/2023 PCP: [name], MD   Brief Narrative: 67 year old with past medical history significant for hypertension, hyperlipidemia, diabetes type 2 well-controlled wake up this morning with left eye lesion really bad.  She noticed that her eyes was swollen.  She cleaned her eye with water, the swelling and the itching continued to get worse.  She presented to the ED and a CT orbit shows soft tissue swelling and induration involving the preseptal left periorbital soft tissue concerning for acute preseptal cellulitis and potential early postseptal extension in the absence of any overt evidence of abscess or drainable fluid collection.  Case was discussed with Dr. Fransisca Connors, recommended IV antibiotics.  Patient seen this morning she reported worsening swelling, she has only received 1 dose of IV antibiotics, I have consulted [facility] ophthalmology.   Assessment & Plan:   Principal Problem:   Facial cellulitis Active Problems:   Diabetes mellitus (HCC)   Dyslipidemia   Essential hypertension  1-Left Preseptal Cellulitis: -Presents with swelling left eye. CT orbit: Soft tissue swelling with induration involving the preseptal left periorbital soft tissues, concerning for acute preseptal cellulitis. Subtle stranding involving the extraconal fat at the superior left orbit, suspicious for possible early postseptal extension. -Continue with Vancomycin, Ceftriaxone and Flagyl.  -Ophthalmology consulted, Appreciate Dr Randon Goldsmith evaluation and recommendations. If any worsening finding, will need to repeat CT orbit.  -PRN benadryl for itching.  -keep head of bed elevated, cool compresses over OS PRN.  Patient with significant improvement of edema today, she is able to open her eye some.   2-Hypokalemia;  Replaced.   3-DM type 2; hypoglycemia:  SSI Suspect hypoglycemia secondary to poor oral intake from prior to  admission.  On semglee 15 units currently.  ( at home on Tresiba 36 units)   Dyslipidemia;  Continue pravastatin and omega.    Estimated body mass index is 26.32 kg/m as calculated from the following:   Height as of this encounter: 5\' 3"  (1.6 m).   Weight as of this encounter: 67.4 kg.   DVT prophylaxis: Heparin  Code Status: Full code Family Communication: Care discussed with patient Disposition Plan:  Status is: Inpatient Remains inpatient appropriate because: management of preseptal cellulitis.     Consultants:  Ophthalmology   Procedures:    Antimicrobials:    Subjective: She is feeling better, she is able to open eyes and see me today. She would like to follow up with Dr Randon Goldsmith after discharge.  She report benadryl, helps with itchiness.   Objective: Vitals:   04/19/23 1323 04/19/23 2120 04/20/23 0529 04/20/23 1304  BP: 121/69 138/68 (!) 141/72 128/70  Pulse: 65 61 (!) 59 65  Resp: 17 18 17 16   Temp: 98.3 F (36.8 C) 98.6 F (37 C) 98.2 F (36.8 C) 98.4 F (36.9 C)  TempSrc: Oral Oral Oral Oral  SpO2: 99% 96% 98% 97%  Weight:      Height:        Intake/Output Summary (Last 24 hours) at 04/20/2023 1624 Last data filed at 04/19/2023 1700 Gross per 24 hour  Intake 480 ml  Output --  Net 480 ml    Filed Weights   04/19/23 0601  Weight: 67.4 kg    Examination:  General exam: NAD, less periorbital swelling.  Respiratory system: CTA Cardiovascular system: S 1, S 2 RRR Gastrointestinal system: BS present, soft, nt Central nervous system: Alert, oriented.  Extremities: Symmetric 5 x  5 power.   Data Reviewed: I have personally reviewed following labs and imaging studies  CBC: Recent Labs  Lab 04/19/23 0129 04/19/23 0715 04/20/23 0607  WBC 7.7 6.6 5.9  NEUTROABS  --   --  3.0  HGB 14.1 13.8 15.0  HCT 43.3 42.4 47.6*  MCV 90.6 91.4 92.8  PLT 255 241 267    Basic Metabolic Panel: Recent Labs  Lab 04/19/23 0129 04/19/23 0715  04/19/23 0910 04/20/23 0607  NA 142  --  140 141  K 2.9*  --  2.9* 3.6  CL 105  --  106 108  CO2 28  --  23 26  GLUCOSE 94  --  224* 88  BUN 12  --  10 13  CREATININE 0.44 0.41* 0.50 0.60  CALCIUM 9.2  --  8.6* 9.1  MG  --   --  1.8  --     GFR: Estimated Creatinine Clearance: 63.8 mL/min (by C-G formula based on SCr of 0.6 mg/dL). Liver Function Tests: No results for input(s): "AST", "ALT", "ALKPHOS", "BILITOT", "PROT", "ALBUMIN" in the last 168 hours. No results for input(s): "LIPASE", "AMYLASE" in the last 168 hours. No results for input(s): "AMMONIA" in the last 168 hours. Coagulation Profile: No results for input(s): "INR", "PROTIME" in the last 168 hours. Cardiac Enzymes: No results for input(s): "CKTOTAL", "CKMB", "CKMBINDEX", "TROPONINI" in the last 168 hours. BNP (last 3 results) No results for input(s): "PROBNP" in the last 8760 hours. HbA1C: Recent Labs    04/19/23 0715  HGBA1C 7.6*   CBG: Recent Labs  Lab 04/19/23 1614 04/19/23 1720 04/19/23 2118 04/20/23 0733 04/20/23 1115  GLUCAP 57* 144* 117* 115* 116*    Lipid Profile: No results for input(s): "CHOL", "HDL", "LDLCALC", "TRIG", "CHOLHDL", "LDLDIRECT" in the last 72 hours. Thyroid Function Tests: No results for input(s): "TSH", "T4TOTAL", "FREET4", "T3FREE", "THYROIDAB" in the last 72 hours. Anemia Panel: No results for input(s): "VITAMINB12", "FOLATE", "FERRITIN", "TIBC", "IRON", "RETICCTPCT" in the last 72 hours. Sepsis Labs: No results for input(s): "PROCALCITON", "LATICACIDVEN" in the last 168 hours.  No results found for this or any previous visit (from the past 240 hour(s)).       Radiology Studies: CT Orbits W Contrast  Result Date: 04/19/2023 CLINICAL DATA:  Initial evaluation for pain and swelling, periorbital cellulitis. EXAM: CT ORBITS WITH CONTRAST TECHNIQUE: Multidetector CT images was performed according to the standard protocol following intravenous contrast administration.  RADIATION DOSE REDUCTION: This exam was performed according to the departmental dose-optimization program which includes automated exposure control, adjustment of the mA and/or kV according to patient size and/or use of iterative reconstruction technique. CONTRAST:  75mL OMNIPAQUE IOHEXOL 300 MG/ML  SOLN COMPARISON:  None Available. FINDINGS: Orbits: Globes are symmetric in size with normal appearance and morphology. Optic nerves symmetric and normal. On the left, there is subtle stranding involving the extraconal fat at the superior left orbit, suspicious for possible early postseptal extension of infection (series 5, image 56). Intraconal and extraconal fat otherwise well-maintained. Lacrimal glands within normal limits. No abnormality about the orbital apices or cavernous sinus. Superior orbital veins symmetric and within normal limits. Visible paranasal sinuses: Scattered mucoperiosteal thickening present about the ethmoidal air cells. Paranasal sinuses are otherwise clear. Visualized mastoids and middle ear cavities are well pneumatized and free of fluid. Soft tissues: Soft tissue swelling with induration involving the preseptal left periorbital soft tissues, concerning for acute preseptal cellulitis. No discrete abscess or drainable fluid collection. Again, no convincing evidence for intraorbital  or postseptal extension at this time. Remainder the visualized facial soft tissues demonstrate no other acute finding. Osseous: No acute osseous finding. Limited intracranial: Unremarkable. IMPRESSION: Soft tissue swelling with induration involving the preseptal left periorbital soft tissues, concerning for acute preseptal cellulitis. Subtle stranding involving the extraconal fat at the superior left orbit, suspicious for possible early postseptal extension. No discrete abscess or drainable fluid collection. Electronically Signed   By: Rise Mu M.D.   On: 04/19/2023 03:37        Scheduled Meds:   amLODipine  10 mg Oral Daily   atenolol  50 mg Oral BID   escitalopram  20 mg Oral Daily   fluticasone  1 spray Each Nare Daily   gabapentin  300 mg Oral BID   heparin  5,000 Units Subcutaneous Q8H   insulin aspart  0-15 Units Subcutaneous TID WC   insulin aspart  0-5 Units Subcutaneous QHS   insulin glargine-yfgn  15 Units Subcutaneous Daily   pravastatin  20 mg Oral Daily   Continuous Infusions:  cefTRIAXone (ROCEPHIN)  IV 2 g (04/19/23 2146)   metronidazole 500 mg (04/20/23 0839)   vancomycin 1,250 mg (04/20/23 1021)     LOS: 1 day    Time spent: 35 minutes    Ashton Belote A Arabel Barcenas, MD [facility]   If 7PM-7AM, please contact night-coverage [email or web address]  04/20/2023, 4:24 PM

## 2023-04-21 DIAGNOSIS — L03211 Cellulitis of face: Secondary | ICD-10-CM | POA: Diagnosis not present

## 2023-04-21 LAB — CBC
HCT: 40.8 % (ref 36.0–46.0)
Hemoglobin: 13.2 g/dL (ref 12.0–15.0)
MCH: 29.5 pg (ref 26.0–34.0)
MCHC: 32.4 g/dL (ref 30.0–36.0)
MCV: 91.3 fL (ref 80.0–100.0)
Platelets: 229 10*3/uL (ref 150–400)
RBC: 4.47 MIL/uL (ref 3.87–5.11)
RDW: 14 % (ref 11.5–15.5)
WBC: 5.4 10*3/uL (ref 4.0–10.5)
nRBC: 0 % (ref 0.0–0.2)

## 2023-04-21 LAB — GLUCOSE, CAPILLARY
Glucose-Capillary: 103 mg/dL — ABNORMAL HIGH (ref 70–99)
Glucose-Capillary: 270 mg/dL — ABNORMAL HIGH (ref 70–99)
Glucose-Capillary: 177 mg/dL — ABNORMAL HIGH (ref 70–99)
Glucose-Capillary: 143 mg/dL — ABNORMAL HIGH (ref 70–99)

## 2023-04-21 LAB — BASIC METABOLIC PANEL
Anion gap: 6 (ref 5–15)
BUN: 11 mg/dL (ref 8–23)
CO2: 28 mmol/L (ref 22–32)
Calcium: 8.5 mg/dL — ABNORMAL LOW (ref 8.9–10.3)
Chloride: 106 mmol/L (ref 98–111)
Creatinine, Ser: 0.33 mg/dL — ABNORMAL LOW (ref 0.44–1.00)
GFR, Estimated: >60 mL/min (ref >60)
Glucose, Bld: 99 mg/dL (ref 70–99)
Potassium: 2.8 mmol/L — ABNORMAL LOW (ref 3.5–5.1)
Sodium: 140 mmol/L (ref 135–145)

## 2023-04-21 LAB — MAGNESIUM: Magnesium: 2 mg/dL (ref 1.7–2.4)

## 2023-04-21 MED ORDER — AMOXICILLIN-POT CLAVULANATE 875-125 MG PO TABS: 1.0000 | ORAL_TABLET | Freq: Two times a day (BID) | ORAL | 0 refills | Status: AC

## 2023-04-21 MED ORDER — POTASSIUM CHLORIDE CRYS ER 20 MEQ PO TBCR
40.0000 meq | EXTENDED_RELEASE_TABLET | Freq: Two times a day (BID) | ORAL | Status: DC
  Administered 2023-04-21: 40 meq via ORAL
  Filled 2023-04-21: qty 2

## 2023-04-21 MED ORDER — DOXYCYCLINE MONOHYDRATE 100 MG PO TABS: 100.0000 mg | ORAL_TABLET | Freq: Two times a day (BID) | ORAL | 0 refills | Status: AC

## 2023-04-22 NOTE — Discharge Summary (Signed)
Physician Discharge Summary   Patient: Sharon Munoz MRN: [id number] DOB: 1955-12-14  Admit date:     04/19/2023  Discharge date: 04/21/2023  Discharge Physician: Briant Cedar   PCP: [name], MD   Recommendations at discharge:   Follow-up with ophthalmology as scheduled Follow-up with PCP in 1 week  Discharge Diagnoses: Principal Problem:   Facial cellulitis Active Problems:   Diabetes mellitus (HCC)   Dyslipidemia   Essential hypertension    Hospital Course: 67 year old with past medical history significant for hypertension, hyperlipidemia, diabetes type 2 well-controlled, presented to the ED with left thigh swelling/itching and pain x 1 day.  In the ED, CT orbits showed soft tissue swelling and induration involving the preseptal left periorbital soft tissue concerning for acute preseptal cellulitis and potential early postseptal extension in the absence of any overt evidence of abscess or drainable fluid collection.  Ophthalmology was consulted.  Patient admitted for further management.   Patient very eager to be discharged, denies any worsening symptoms, reports significant improvement, able to see with the left eye, denies any swelling or pain.   Assessment and Plan:  Left Preseptal Cellulitis CT orbit: Soft tissue swelling with induration involving the preseptal left periorbital soft tissues, concerning for acute preseptal cellulitis. Subtle stranding involving the extraconal fat at the superior left orbit, suspicious for possible early postseptal extension Ophthalmology consulted, follow-up as an outpatient S/p IV Vancomycin, Ceftriaxone and Flagyl, discharged on PO Augmentin and doxycycline  Hypokalemia;  Replaced   DM type 2 Continue home regimen  Dyslipidemia;  Continue pravastatin and omega.        Consultants: Ophthalmology Procedures performed: None Disposition: Home Diet recommendation:  Carb modified diet   DISCHARGE  MEDICATION: Allergies as of 04/21/2023       Reactions   Ace Inhibitors Itching, Swelling   Citrus Hives, Swelling, Other (See Comments)   Pt says she gets knots.    Codeine Nausea And Vomiting   Excessive vomiting   Lisinopril Hives   Other Itching, Swelling, Other (See Comments)   Any type of nut Pt will get mouth blisters   Percocet [oxycodone-acetaminophen]    Disoriented, difficulty waking up and hard to focus, vomiting   Propoxyphene Other (See Comments)   Darvocet (Propoxyphene-Acetaminophen).  Causes Disorientation, difficulty waking up, difficulty focusing, and vomiting.   Januvia [sitagliptin] Itching, Palpitations        Medication List     STOP taking these medications    benzonatate 100 MG capsule Commonly known as: TESSALON   hydrocortisone cream 0.5 %   triamcinolone cream 0.1 % Commonly known as: KENALOG       TAKE these medications    acetaminophen 500 MG tablet Commonly known as: TYLENOL Take 1,000 mg by mouth every 6 (six) hours as needed for mild pain.   amoxicillin-clavulanate 875-125 MG tablet Commonly known as: AUGMENTIN Take 1 tablet by mouth 2 (two) times daily for 5 days.   aspirin EC 81 MG tablet Take 81 mg by mouth daily.   atenolol 50 MG tablet Commonly known as: TENORMIN Take 1 tablet (50 mg total) by mouth 2 (two) times daily.   Calcium 500 + D3 500-15 MG-MCG Tabs Generic drug: Calcium Carb-Cholecalciferol 2 (two) times a week.   celecoxib 200 MG capsule Commonly known as: CeleBREX Take 1 capsule (200 mg total) by mouth daily as needed.   cetirizine 10 MG chewable tablet Commonly known as: ZYRTEC Chew 10 mg by mouth as needed for allergies.  doxycycline 100 MG tablet Commonly known as: ADOXA Take 1 tablet (100 mg total) by mouth 2 (two) times daily for 5 days.   empagliflozin 25 MG Tabs tablet Commonly known as: Jardiance Take 1 tablet (25 mg total) by mouth daily.   escitalopram 20 MG tablet Commonly known as:  Lexapro Take 1 tablet (20 mg total) by mouth daily.   Fish Oil 1000 MG Caps Take 1,000 mg by mouth 2 (two) times a week.   fluticasone 50 MCG/ACT nasal spray Commonly known as: FLONASE Place 1 spray into both nostrils daily.   gabapentin 100 MG capsule Commonly known as: NEURONTIN Take 3 capsules (300 mg total) by mouth 3 (three) times daily.   gatifloxacin 0.5 % Soln Commonly known as: ZYMAXID Place into both eyes daily at 6 (six) AM.   HAIR SKIN NAILS PO Take 1 capsule by mouth 3 (three) times a week.   hydrochlorothiazide 12.5 MG capsule Commonly known as: MICROZIDE Take 12.5 mg by mouth daily.   ibandronate 150 MG tablet Commonly known as: BONIVA every 30 (thirty) days.   ketorolac 0.5 % ophthalmic solution Commonly known as: ACULAR Place 1 drop into the right eye 4 (four) times daily.   Klor-Con M10 10 MEQ tablet Generic drug: potassium chloride Take 10 mEq by mouth 3 (three) times a week. For 30 days   levocetirizine 5 MG tablet Commonly known as: XYZAL Take 5 mg by mouth as needed for allergies.   Magnesium 250 MG Tabs daily.   pravastatin 20 MG tablet Commonly known as: PRAVACHOL TAKE 1 TABLET BY MOUTH EVERY DAY   prednisoLONE acetate 1 % ophthalmic suspension Commonly known as: PRED FORTE Place into both eyes daily at 6 (six) AM.   Evaristo Bury FlexTouch 100 UNIT/ML FlexTouch Pen Generic drug: insulin degludec Inject 36 Units into the skin daily.   Vitamin C 500 MG Chew daily at 6 (six) AM.   VITAMIN D3 PO Take 1 capsule by mouth 4 (four) times a week. [employer] Brand        Follow-up Information     Antony Contras, MD Follow up in 1 week(s).   Specialty: Ophthalmology Contact information: [address] [place] [address] [phone]         Tisovec, Adelfa Koh, MD Follow up in 1 week(s).   Specialty: Internal Medicine Contact information: [address] [place] [address] [phone]                Discharge  Exam: Ceasar Mons Weights   04/19/23 0601  Weight: 67.4 kg   General: NAD, L eye swelling improved, no obvious discharge, vision stable   Cardiovascular: S1, S2 present Respiratory: CTAB Abdomen: Soft, nontender, nondistended, bowel sounds present Musculoskeletal: No bilateral pedal edema noted Skin: Normal Psychiatry: Normal mood   Condition at discharge: stable  The results of significant diagnostics from this hospitalization (including imaging, microbiology, ancillary and laboratory) are listed below for reference.   Imaging Studies: CT Orbits W Contrast  Result Date: 04/19/2023 CLINICAL DATA:  Initial evaluation for pain and swelling, periorbital cellulitis. EXAM: CT ORBITS WITH CONTRAST TECHNIQUE: Multidetector CT images was performed according to the standard protocol following intravenous contrast administration. RADIATION DOSE REDUCTION: This exam was performed according to the departmental dose-optimization program which includes automated exposure control, adjustment of the mA and/or kV according to patient size and/or use of iterative reconstruction technique. CONTRAST:  75mL OMNIPAQUE IOHEXOL 300 MG/ML  SOLN COMPARISON:  None Available. FINDINGS: Orbits: Globes are symmetric in size  with normal appearance and morphology. Optic nerves symmetric and normal. On the left, there is subtle stranding involving the extraconal fat at the superior left orbit, suspicious for possible early postseptal extension of infection (series 5, image 56). Intraconal and extraconal fat otherwise well-maintained. Lacrimal glands within normal limits. No abnormality about the orbital apices or cavernous sinus. Superior orbital veins symmetric and within normal limits. Visible paranasal sinuses: Scattered mucoperiosteal thickening present about the ethmoidal air cells. Paranasal sinuses are otherwise clear. Visualized mastoids and middle ear cavities are well pneumatized and free of fluid. Soft tissues: Soft tissue  swelling with induration involving the preseptal left periorbital soft tissues, concerning for acute preseptal cellulitis. No discrete abscess or drainable fluid collection. Again, no convincing evidence for intraorbital or postseptal extension at this time. Remainder the visualized facial soft tissues demonstrate no other acute finding. Osseous: No acute osseous finding. Limited intracranial: Unremarkable. IMPRESSION: Soft tissue swelling with induration involving the preseptal left periorbital soft tissues, concerning for acute preseptal cellulitis. Subtle stranding involving the extraconal fat at the superior left orbit, suspicious for possible early postseptal extension. No discrete abscess or drainable fluid collection. Electronically Signed   By: Rise Mu M.D.   On: 04/19/2023 03:37    Microbiology: Results for orders placed or performed during the hospital encounter of 12/27/22  SARS CORONAVIRUS 2 (TAT 6-24 HRS) Anterior Nasal Swab     Status: Abnormal   Collection Time: 12/27/22  3:56 PM   Specimen: Anterior Nasal Swab  Result Value Ref Range Status   SARS Coronavirus 2 POSITIVE (A) NEGATIVE Final    Comment: (NOTE) SARS-CoV-2 target nucleic acids are DETECTED.  The SARS-CoV-2 RNA is generally detectable in upper and lower respiratory specimens during the acute phase of infection. Positive results are indicative of the presence of SARS-CoV-2 RNA. Clinical correlation with patient history and other diagnostic information is  necessary to determine patient infection status. Positive results do not rule out bacterial infection or co-infection with other viruses.  The expected result is Negative.  Fact Sheet for Patients: HairSlick.no  Fact Sheet for Healthcare Providers: quierodirigir.com  This test is not yet approved or cleared by the [place] FDA and  has been authorized for detection and/or diagnosis of  SARS-CoV-2 by FDA under an Emergency Use Authorization (EUA). This EUA will remain  in effect (meaning this test can be used) for the duration of the COVID-19 declaration under Section 564(b)(1) of the Act, 21 U. S.C. section 360bbb-3(b)(1), unless the authorization is terminated or revoked sooner.   Performed at [facility], [address], [place], [place] [address]     Labs: CBC: Recent Labs  Lab 04/19/23 0129 04/19/23 0715 04/20/23 0607 04/21/23 0544  WBC 7.7 6.6 5.9 5.4  NEUTROABS  --   --  3.0  --   HGB 14.1 13.8 15.0 13.2  HCT 43.3 42.4 47.6* 40.8  MCV 90.6 91.4 92.8 91.3  PLT 255 241 267 229   Basic Metabolic Panel: Recent Labs  Lab 04/19/23 0129 04/19/23 0715 04/19/23 0910 04/20/23 0607 04/21/23 0544  NA 142  --  140 141 140  K 2.9*  --  2.9* 3.6 2.8*  CL 105  --  106 108 106  CO2 28  --  23 26 28   GLUCOSE 94  --  224* 88 99  BUN 12  --  10 13 11   CREATININE 0.44 0.41* 0.50 0.60 0.33*  CALCIUM 9.2  --  8.6* 9.1 8.5*  MG  --   --  1.8  --  2.0   Liver Function Tests: No results for input(s): "AST", "ALT", "ALKPHOS", "BILITOT", "PROT", "ALBUMIN" in the last 168 hours. CBG: Recent Labs  Lab 04/20/23 2200 04/21/23 0728 04/21/23 1024 04/21/23 1127 04/21/23 1221  GLUCAP 208* 103* 270* 177* 143*    Discharge time spent: greater than 30 minutes.  Signed: Briant Cedar, MD [facility] 04/22/2023

## 2023-08-05 ENCOUNTER — Emergency Department (HOSPITAL_BASED_OUTPATIENT_CLINIC_OR_DEPARTMENT_OTHER): Payer: Medicare Other

## 2023-08-05 ENCOUNTER — Encounter (HOSPITAL_BASED_OUTPATIENT_CLINIC_OR_DEPARTMENT_OTHER): Payer: Self-pay | Admitting: Emergency Medicine

## 2023-08-05 ENCOUNTER — Emergency Department (HOSPITAL_BASED_OUTPATIENT_CLINIC_OR_DEPARTMENT_OTHER)
Admission: EM | Admit: 2023-08-05 | Discharge: 2023-08-05 | Disposition: A | Discharge status: Home / Self Care | Payer: Medicare Other | Attending: Emergency Medicine | Admitting: Emergency Medicine

## 2023-08-05 ENCOUNTER — Other Ambulatory Visit: Payer: Self-pay

## 2023-08-05 DIAGNOSIS — S61212A Laceration without foreign body of right middle finger without damage to nail, initial encounter: Secondary | ICD-10-CM | POA: Diagnosis not present

## 2023-08-05 DIAGNOSIS — W19XXXA Unspecified fall, initial encounter: Secondary | ICD-10-CM

## 2023-08-05 DIAGNOSIS — S0083XA Contusion of other part of head, initial encounter: Secondary | ICD-10-CM | POA: Diagnosis not present

## 2023-08-05 DIAGNOSIS — S6991XA Unspecified injury of right wrist, hand and finger(s), initial encounter: Secondary | ICD-10-CM | POA: Diagnosis present

## 2023-08-05 DIAGNOSIS — Z7982 Long term (current) use of aspirin: Secondary | ICD-10-CM | POA: Insufficient documentation

## 2023-08-05 DIAGNOSIS — I10 Essential (primary) hypertension: Secondary | ICD-10-CM | POA: Insufficient documentation

## 2023-08-05 DIAGNOSIS — E119 Type 2 diabetes mellitus without complications: Secondary | ICD-10-CM | POA: Insufficient documentation

## 2023-08-05 DIAGNOSIS — S60221A Contusion of right hand, initial encounter: Secondary | ICD-10-CM | POA: Diagnosis not present

## 2023-08-05 DIAGNOSIS — Y9301 Activity, walking, marching and hiking: Secondary | ICD-10-CM | POA: Diagnosis not present

## 2023-08-05 DIAGNOSIS — W1830XA Fall on same level, unspecified, initial encounter: Secondary | ICD-10-CM | POA: Diagnosis not present

## 2023-08-05 DIAGNOSIS — Y92019 Unspecified place in single-family (private) house as the place of occurrence of the external cause: Secondary | ICD-10-CM | POA: Diagnosis not present

## 2023-08-05 DIAGNOSIS — Z79899 Other long term (current) drug therapy: Secondary | ICD-10-CM | POA: Insufficient documentation

## 2023-08-05 DIAGNOSIS — Z23 Encounter for immunization: Secondary | ICD-10-CM | POA: Insufficient documentation

## 2023-08-05 MED ORDER — TETANUS-DIPHTH-ACELL PERTUSSIS 5-2.5-18.5 LF-MCG/0.5 IM SUSY
0.5000 mL | PREFILLED_SYRINGE | Freq: Once | INTRAMUSCULAR | Status: AC
  Administered 2023-08-05: 0.5 mL via INTRAMUSCULAR
  Filled 2023-08-05: qty 0.5

## 2023-08-05 MED ORDER — ACETAMINOPHEN 500 MG PO TABS
1000.0000 mg | ORAL_TABLET | Freq: Once | ORAL | Status: AC
  Administered 2023-08-05: 1000 mg via ORAL
  Filled 2023-08-05: qty 2

## 2023-08-05 MED ORDER — LIDOCAINE HCL (PF) 1 % IJ SOLN
5.0000 mL | Freq: Once | INTRAMUSCULAR | Status: DC
  Filled 2023-08-05: qty 5

## 2023-08-05 MED ORDER — TETANUS-DIPHTH-ACELL PERTUSSIS 5-2.5-18.5 LF-MCG/0.5 IM SUSY: 0.5000 mL | PREFILLED_SYRINGE | Freq: Once | INTRAMUSCULAR | Status: DC

## 2023-08-05 NOTE — ED Triage Notes (Signed)
Pt in with pain to R forehead and lac to R middle finger after trip fall vs concrete. Pt denies any LOC or thinners. Denies any neck or back pain. Last tetanus 41yrs ago

## 2023-08-05 NOTE — Discharge Instructions (Signed)
The stitches you have will dissolve and you will be able to pull them out in approximately 1 week.  You can leave the bandage in place till Saturday and then you can remove it clean the wound and put a new dressing on it.  You can wear the splint for comfort.  If you have any trouble bending your finger once the swelling goes down you need to follow-up with the specialist.  The CAT scan of your brain today was normal.  You can take Tylenol or ibuprofen as needed for pain

## 2023-08-05 NOTE — ED Provider Notes (Signed)
[facility] AT [facility] Provider Note   CSN: [id number] Arrival date & time: 08/05/23  2202     History  Chief Complaint  Patient presents with   Fall   Laceration    Sharon Munoz is a 67 y.o. female.  Patient is a 67 year old female with a history of hypertension, diabetes, hyperlipidemia who is presenting today after a fall.  She had just taken out her trash when she was walking back into the house when she thinks she did not pick her foot up high enough in her tennis shoe scuffed causing her to fall forward.  She did catch herself on her right hand but also ended up hitting the right side of her forehead on the pavement.  She denies any loss of consciousness and reports she laid there for a while to make sure she was okay but she was eventually able to get up and walk.  She is having significant pain in the right hand especially over the right third finger where she also had a laceration.  She thinks her finger was bent back so far it broke the skin.  She states feeling a little bit woozy when she stood up after hitting her head but otherwise denies any visual changes, neck pain, numbness or tingling on one side of her body.  She has had no nausea or vomiting.  She does not take any anticoagulation.  Last tetanus shot was 2015.  The history is provided by the patient.  Fall  Laceration      Home Medications Prior to Admission medications   Medication Sig Start Date End Date Taking? Authorizing Provider  acetaminophen (TYLENOL) 500 MG tablet Take 1,000 mg by mouth every 6 (six) hours as needed for mild pain.    [provider]  Ascorbic Acid (VITAMIN C) 500 MG CHEW daily at 6 (six) AM.    [provider]  aspirin EC 81 MG tablet Take 81 mg by mouth daily.    [provider]  atenolol (TENORMIN) 50 MG tablet Take 1 tablet (50 mg total) by mouth 2 (two) times daily. 10/21/21   Ronney Asters, NP  Calcium  Carb-Cholecalciferol (CALCIUM 500 + D3) 500-15 MG-MCG TABS 2 (two) times a week.    [provider]  celecoxib (CELEBREX) 200 MG capsule Take 1 capsule (200 mg total) by mouth daily as needed. 12/27/22   Gustavus Bryant, FNP  cetirizine (ZYRTEC) 10 MG chewable tablet Chew 10 mg by mouth as needed for allergies. 03/06/20   [provider]  Cholecalciferol (VITAMIN D3 PO) Take 1 capsule by mouth 4 (four) times a week. [employer] Brand    [provider]  empagliflozin (JARDIANCE) 25 MG TABS tablet Take 1 tablet (25 mg total) by mouth daily. 11/20/22   Ronney Asters, NP  escitalopram (LEXAPRO) 20 MG tablet Take 1 tablet (20 mg total) by mouth daily. 03/09/20 04/19/23  Rhetta Mura, MD  fluticasone (FLONASE) 50 MCG/ACT nasal spray Place 1 spray into both nostrils daily. 12/27/22   Gustavus Bryant, FNP  gabapentin (NEURONTIN) 100 MG capsule Take 3 capsules (300 mg total) by mouth 3 (three) times daily. 02/12/16   Levert Feinstein, MD  gatifloxacin (ZYMAXID) 0.5 % SOLN Place into both eyes daily at 6 (six) AM. 04/08/23   [provider]  hydrochlorothiazide (MICROZIDE) 12.5 MG capsule Take 12.5 mg by mouth daily. 04/06/23   [provider]  ibandronate (BONIVA) 150 MG tablet every 30 (  thirty) days. Patient not taking: Reported on 04/19/2023    [provider]  ketorolac (ACULAR) 0.5 % ophthalmic solution Place 1 drop into the right eye 4 (four) times daily. 04/06/23   [provider]  KLOR-CON M10 10 MEQ tablet Take 10 mEq by mouth 3 (three) times a week. For 30 days 02/11/22   [provider]  levocetirizine (XYZAL) 5 MG tablet Take 5 mg by mouth as needed for allergies.    [provider]  Magnesium 250 MG TABS daily.    [provider]  Multiple Vitamins-Minerals (HAIR SKIN NAILS PO) Take 1 capsule by mouth 3 (three) times a week.    [provider]  Omega-3 Fatty Acids (FISH OIL) 1000 MG CAPS Take 1,000 mg by mouth  2 (two) times a week.    [provider]  pravastatin (PRAVACHOL) 20 MG tablet TAKE 1 TABLET BY MOUTH EVERY DAY 12/14/22   Ronney Asters, NP  prednisoLONE acetate (PRED FORTE) 1 % ophthalmic suspension Place into both eyes daily at 6 (six) AM. 04/06/23   [provider]  TRESIBA FLEXTOUCH 100 UNIT/ML FlexTouch Pen Inject 36 Units into the skin daily. 06/20/20   [provider]      Allergies    Ace inhibitors, Citrus, Codeine, Lisinopril, Other, Percocet [oxycodone-acetaminophen], Propoxyphene, and Januvia [sitagliptin]    Review of Systems   Review of Systems  Physical Exam Updated Vital Signs BP (!) 104/92   Pulse 85   Temp 98.3 F (36.8 C) (Oral)   Resp 20   Wt 67.4 kg   SpO2 98%   BMI 26.32 kg/m  Physical Exam Vitals and nursing note reviewed.  Constitutional:      General: She is not in acute distress.    Appearance: She is well-developed.  HENT:     Head: Normocephalic and atraumatic.   Eyes:     Pupils: Pupils are equal, round, and reactive to light.  Cardiovascular:     Rate and Rhythm: Normal rate and regular rhythm.     Heart sounds: Normal heart sounds. No murmur heard.    No friction rub.  Pulmonary:     Effort: Pulmonary effort is normal.     Breath sounds: Normal breath sounds. No wheezing or rales.  Abdominal:     General: Bowel sounds are normal. There is no distension.     Palpations: Abdomen is soft.     Tenderness: There is no abdominal tenderness. There is no guarding or rebound.  Musculoskeletal:        General: Tenderness and signs of injury present. Normal range of motion.     Right elbow: Normal.     Right wrist: Normal.       Hands:     Cervical back: No spinous process tenderness or muscular tenderness.     Comments: No edema.  Swelling and tenderness of the hand over the 2nd through 4th MCP joints with associated ecchymosis  Skin:    General: Skin is warm and dry.     Findings: No rash.  Neurological:      Mental Status: She is alert and oriented to person, place, and time.     Cranial Nerves: No cranial nerve deficit.  Psychiatric:        Behavior: Behavior normal.     ED Results / Procedures / Treatments   Labs (all labs ordered are listed, but only abnormal results are displayed) Labs Reviewed - No data to display  EKG  None  Radiology CT Head Wo Contrast  Result Date: 08/05/2023 CLINICAL DATA:  Larey Seat onto concrete.  Pain to right forehead. EXAM: CT HEAD WITHOUT CONTRAST TECHNIQUE: Contiguous axial images were obtained from the base of the skull through the vertex without intravenous contrast. RADIATION DOSE REDUCTION: This exam was performed according to the departmental dose-optimization program which includes automated exposure control, adjustment of the mA and/or kV according to patient size and/or use of iterative reconstruction technique. COMPARISON:  CT orbits 04/19/2023 and MRI head 04/04/2008 FINDINGS: Brain: No intracranial hemorrhage, mass effect, or evidence of acute infarct. No hydrocephalus. No extra-axial fluid collection. Age-commensurate cerebral atrophy and chronic small vessel ischemic disease. Vascular: No hyperdense vessel. Intracranial arterial calcification. Skull: No fracture or focal lesion. Sinuses/Orbits: No acute finding. Other: None. IMPRESSION: No acute intracranial abnormality. Electronically Signed   By: Minerva Fester M.D.   On: 08/05/2023 22:49   DG Hand Complete Right  Result Date: 08/05/2023 CLINICAL DATA:  Right hand laceration.  Fell on concrete. EXAM: RIGHT HAND - COMPLETE 3+ VIEW COMPARISON:  None Available. FINDINGS: No acute fracture or dislocation. Degenerative arthritis about the IP and first CMC joints. Soft tissue irregularity about the volar hand and wrist. IMPRESSION: No acute fracture or dislocation. Electronically Signed   By: Minerva Fester M.D.   On: 08/05/2023 22:45    Procedures Procedures   LACERATION REPAIR Performed by: [employer] Authorized by: Gwyneth Sprout Consent: Verbal consent obtained. Risks and benefits: risks, benefits and alternatives were discussed Consent given by: patient Patient identity confirmed: provided demographic data Prepped and Draped in normal sterile fashion Wound explored  Laceration Location: right 3rd finger  Laceration Length: 2cm  No Foreign Bodies seen or palpated  Anesthesia: digital block  Local anesthetic: lidocaine 1% without epinephrine  Anesthetic total: 3 ml  Irrigation method: syringe Amount of cleaning: standard  Skin closure: 4.0 vicryl rapide  Number of sutures: 4  Technique: simple interrupted  Patient tolerance: Patient tolerated the procedure well with no immediate complications.  Medications Ordered in ED Medications  lidocaine (PF) (XYLOCAINE) 1 % injection 5 mL (has no administration in time range)  acetaminophen (TYLENOL) tablet 1,000 mg (1,000 mg Oral Given 08/05/23 2238)  Tdap (BOOSTRIX) injection 0.5 mL (0.5 mLs Intramuscular Given 08/05/23 2238)    ED Course/ Medical Decision Making/ A&P                                 Medical Decision Making Amount and/or Complexity of Data Reviewed Radiology: ordered and independent interpretation performed. Decision-making details documented in ED Course.  Risk OTC drugs. Prescription drug management.   Pt with multiple medical problems and comorbidities and presenting today with a complaint that caries a high risk for morbidity and mortality.  Here today after a fall.  Patient injured her head and her right hand.  Attempted to repair patient's laceration however she does not desire to have a digital block and wants to let her wound heal by secondary intention.  Tetanus shot updated.  Will ensure no evidence of intracranial bleed after she hit her head.  She has no neck pain and low suspicion for facial fracture.  Xray to rule out fracture.  I have independently visualized and interpreted pt's  images today. CT head neg for bleed.  Hand film with no findings for acute fracture today.  Patient's wound was cleaned and placed in a finger splint.  11:23 PM  Patient now has decided she wants repair.  Wound repaired as above.  Stable for discharge at this time.  Patient is ambulatory without any issue.         Final Clinical Impression(s) / ED Diagnoses Final diagnoses:  Fall, initial encounter  Contusion of forehead, initial encounter  Laceration of right middle finger without foreign body without damage to nail, initial encounter  Contusion of right hand, initial encounter    Rx / DC Orders ED Discharge Orders     None         Gwyneth Sprout, MD 08/05/23 [phone]

## 2023-09-14 DIAGNOSIS — L820 Inflamed seborrheic keratosis: Secondary | ICD-10-CM | POA: Diagnosis not present

## 2023-09-14 DIAGNOSIS — L700 Acne vulgaris: Secondary | ICD-10-CM | POA: Diagnosis not present

## 2023-09-14 DIAGNOSIS — L720 Epidermal cyst: Secondary | ICD-10-CM | POA: Diagnosis not present

## 2023-09-14 DIAGNOSIS — D239 Other benign neoplasm of skin, unspecified: Secondary | ICD-10-CM | POA: Diagnosis not present

## 2023-10-20 DIAGNOSIS — E1140 Type 2 diabetes mellitus with diabetic neuropathy, unspecified: Secondary | ICD-10-CM | POA: Diagnosis not present

## 2023-10-20 DIAGNOSIS — Z794 Long term (current) use of insulin: Secondary | ICD-10-CM | POA: Diagnosis not present

## 2023-10-20 DIAGNOSIS — M810 Age-related osteoporosis without current pathological fracture: Secondary | ICD-10-CM | POA: Diagnosis not present

## 2023-10-20 DIAGNOSIS — J309 Allergic rhinitis, unspecified: Secondary | ICD-10-CM | POA: Diagnosis not present

## 2023-10-20 DIAGNOSIS — I10 Essential (primary) hypertension: Secondary | ICD-10-CM | POA: Diagnosis not present

## 2023-10-20 DIAGNOSIS — I5181 Takotsubo syndrome: Secondary | ICD-10-CM | POA: Diagnosis not present

## 2023-10-20 DIAGNOSIS — E7800 Pure hypercholesterolemia, unspecified: Secondary | ICD-10-CM | POA: Diagnosis not present

## 2023-10-20 DIAGNOSIS — E559 Vitamin D deficiency, unspecified: Secondary | ICD-10-CM | POA: Diagnosis not present

## 2024-01-20 ENCOUNTER — Encounter: Payer: Self-pay | Admitting: Emergency Medicine

## 2024-01-20 ENCOUNTER — Ambulatory Visit
Admission: EM | Admit: 2024-01-20 | Discharge: 2024-01-20 | Disposition: A | Discharge status: Home / Self Care | Payer: Medicare PPO | Attending: Family Medicine | Admitting: Family Medicine

## 2024-01-20 DIAGNOSIS — J019 Acute sinusitis, unspecified: Secondary | ICD-10-CM

## 2024-01-20 DIAGNOSIS — J4521 Mild intermittent asthma with (acute) exacerbation: Secondary | ICD-10-CM

## 2024-01-20 MED ORDER — BENZONATATE 100 MG PO CAPS: 100.0000 mg | ORAL_CAPSULE | Freq: Three times a day (TID) | ORAL | 0 refills | Status: DC | PRN

## 2024-01-20 MED ORDER — ALBUTEROL SULFATE HFA 108 (90 BASE) MCG/ACT IN AERS: 2.0000 | INHALATION_SPRAY | RESPIRATORY_TRACT | 0 refills | Status: AC | PRN

## 2024-01-20 MED ORDER — DOXYCYCLINE HYCLATE 100 MG PO CAPS: 100.0000 mg | ORAL_CAPSULE | Freq: Two times a day (BID) | ORAL | 0 refills | Status: AC

## 2024-01-20 MED ORDER — PREDNISONE 20 MG PO TABS: 40.0000 mg | ORAL_TABLET | Freq: Every day | ORAL | 0 refills | Status: AC

## 2024-01-20 NOTE — ED Triage Notes (Signed)
 Pt reports nasal congestion, productive cough, and runny nose x1 week. Pt reports she was sick 3 weeks ago as well with same symptoms. Felt okay for one week and then this started again last Thurs. Pt reports her grandchildren did have RSV 2 weeks ago. Pt reports using ginger tea/honey, delsym, and cough syrup at home with no relief.

## 2024-01-20 NOTE — Discharge Instructions (Signed)
 Albuterol inhaler--do 2 puffs every 4 hours as needed for shortness of breath or wheezing  Take prednisone 20 mg--2 daily for 5 days; this is for inflammation in your lungs  Take doxycycline 100 mg --1 capsule 2 times daily for 7 days; this is the antibiotic  Take benzonatate 100 mg, 1 tab every 8 hours as needed for cough.

## 2024-01-21 NOTE — ED Provider Notes (Signed)
 [name]    CSN: [id number] Arrival date & time: 01/20/24  1823      History   Chief Complaint Chief Complaint  Patient presents with   Cough   Nasal Congestion    HPI Sharon Munoz is a 68 y.o. female.    Cough Here for cough and congestion and wheezing.   She had gotten cold symptoms about 3 weeks ago, and that got better, though she was left with some residual congestion. Then about 7 days ago, she began having more sinus pressure and congestion, postnasal dc, and she has felt congestion in her chest with wheezing. No fever in the last week. States she does not have asthma, but she is prescribed an inhaler to use. She has misplaced this; filled about 3 weeks ago.  No n/v/d.   Allergic to ACEI, codeine, darvon, and januvia.  Past Medical History:  Diagnosis Date   Back spasm    Diabetes mellitus    Hypertension    Systolic ejection murmur 1986    Patient Active Problem List   Diagnosis Date Noted   Facial cellulitis 04/19/2023   Nonrheumatic mitral valve regurgitation 10/07/2022   LVH (left ventricular hypertrophy) 10/07/2022   Asymmetric septal hypertrophy 04/23/2022   Cardiomyopathy (HCC) 03/20/2020   Back muscle spasm 03/08/2020   Osteoporosis 04/06/2019   Osteopenia 04/06/2019   Palpitations 05/10/2018   Bilateral hand pain 02/12/2016   H. pylori infection 01/29/2016   Chronic urticaria 12/10/2015   Angioedema 12/10/2015   Chronic rhinitis 12/10/2015   WRIST PAIN, RIGHT 03/05/2009   HYPOMAGNESEMIA 10/05/2008   CATARACTS, BILATERAL 08/24/2008   Cardiac murmur 08/24/2008   Carpal tunnel syndrome of left wrist 08/23/2008   Dyslipidemia 10/20/2007   HYPOKALEMIA 10/20/2007   OTHER ACUTE REACTIONS TO STRESS 10/04/2007   Esophageal reflux 06/07/2007   POSTMENOPAUSAL STATUS 11/30/2006   Diabetes mellitus (HCC) 11/30/2005   Hypertension 11/30/2002    Past Surgical History:  Procedure Laterality Date   CESAREAN SECTION      RIGHT/LEFT HEART CATH AND CORONARY ANGIOGRAPHY N/A 03/08/2020   Procedure: RIGHT/LEFT HEART CATH AND CORONARY ANGIOGRAPHY;  Surgeon: Yvonne Kendall, MD;  Location: [facility];  Service: Cardiovascular;  Laterality: N/A;    OB History   No obstetric history on file.      Home Medications    Prior to Admission medications   Medication Sig Start Date End Date Taking? Authorizing Provider  albuterol (VENTOLIN HFA) 108 (90 Base) MCG/ACT inhaler Inhale 2 puffs into the lungs every 4 (four) hours as needed for wheezing or shortness of breath. 01/20/24  Yes Zenia Resides, MD  amLODipine (NORVASC) 10 MG tablet Take 10 mg by mouth daily. 01/17/24  Yes [provider]  Ascorbic Acid (VITAMIN C) 500 MG CHEW daily at 6 (six) AM.   Yes [provider]  aspirin EC 81 MG tablet Take 81 mg by mouth daily.   Yes [provider]  atenolol (TENORMIN) 50 MG tablet Take 1 tablet (50 mg total) by mouth 2 (two) times daily. 10/21/21  Yes Cleaver, Thomasene Ripple, NP  BD PEN NEEDLE NANO 2ND GEN 32G X 4 MM MISC USE WITH TRESIBA DAILY 90 12/18/23  Yes [provider]  benzonatate (TESSALON) 100 MG capsule Take 1 capsule (100 mg total) by mouth 3 (three) times daily as needed for cough. 01/20/24  Yes Zenia Resides, MD  Calcium Carb-Cholecalciferol (CALCIUM 500 + D3) 500-15 MG-MCG TABS 2 (two) times a week.  Yes [provider]  cetirizine (ZYRTEC) 10 MG chewable tablet Chew 10 mg by mouth as needed for allergies. 03/06/20  Yes [provider]  Cholecalciferol (VITAMIN D3 PO) Take 1 capsule by mouth 4 (four) times a week. [employer] Brand   Yes [provider]  doxycycline (VIBRAMYCIN) 100 MG capsule Take 1 capsule (100 mg total) by mouth 2 (two) times daily for 7 days. 01/20/24 01/27/24 Yes Zenia Resides, MD  empagliflozin (JARDIANCE) 25 MG TABS tablet Take 1 tablet (25 mg total) by mouth daily. 11/20/22  Yes Cleaver, Thomasene Ripple, NP  fluticasone  (FLONASE) 50 MCG/ACT nasal spray Place 1 spray into both nostrils daily. 12/27/22  Yes Mound, Rolly Salter E, FNP  gabapentin (NEURONTIN) 100 MG capsule Take 3 capsules (300 mg total) by mouth 3 (three) times daily. 02/12/16  Yes Levert Feinstein, MD  hydrochlorothiazide (MICROZIDE) 12.5 MG capsule Take 12.5 mg by mouth daily. 04/06/23  Yes [provider]  KLOR-CON M10 10 MEQ tablet Take 10 mEq by mouth 3 (three) times a week. For 30 days 02/11/22  Yes [provider]  levocetirizine (XYZAL) 5 MG tablet Take 5 mg by mouth as needed for allergies.   Yes [provider]  Magnesium 250 MG TABS daily.   Yes [provider]  Multiple Vitamins-Minerals (HAIR SKIN NAILS PO) Take 1 capsule by mouth 3 (three) times a week.   Yes [provider]  Omega-3 Fatty Acids (FISH OIL) 1000 MG CAPS Take 1,000 mg by mouth 2 (two) times a week.   Yes [provider]  pravastatin (PRAVACHOL) 20 MG tablet TAKE 1 TABLET BY MOUTH EVERY DAY 12/14/22  Yes Cleaver, Thomasene Ripple, NP  predniSONE (DELTASONE) 20 MG tablet Take 2 tablets (40 mg total) by mouth daily with breakfast for 5 days. 01/20/24 01/25/24 Yes Deonta Bomberger, Janace Aris, MD  TRESIBA FLEXTOUCH 100 UNIT/ML FlexTouch Pen Inject 36 Units into the skin daily. 06/20/20  Yes [provider]  acetaminophen (TYLENOL) 500 MG tablet Take 1,000 mg by mouth every 6 (six) hours as needed for mild pain.    [provider]  celecoxib (CELEBREX) 200 MG capsule Take 1 capsule (200 mg total) by mouth daily as needed. 12/27/22   Gustavus Bryant, FNP  clindamycin (CLEOCIN T) 1 % external solution Apply 1 Application topically 2 (two) times daily. 01/13/24   [provider]  escitalopram (LEXAPRO) 20 MG tablet Take 1 tablet (20 mg total) by mouth daily. 03/09/20 04/19/23  Rhetta Mura, MD  gatifloxacin (ZYMAXID) 0.5 % SOLN Place into both eyes daily at 6 (six) AM. 04/08/23   [provider]  ibandronate (BONIVA) 150 MG tablet  every 30 (thirty) days. Patient not taking: Reported on 04/19/2023    [provider]  ketorolac (ACULAR) 0.5 % ophthalmic solution Place 1 drop into the right eye 4 (four) times daily. Patient not taking: Reported on 01/20/2024 04/06/23   [provider]  prednisoLONE acetate (PRED FORTE) 1 % ophthalmic suspension Place into both eyes daily at 6 (six) AM. 04/06/23   [provider]    Family History Family History  Problem Relation Age of Onset   Hypertension Mother    Asthma Father    Hypertension Father    Kidney cancer Father    CVA Father 63   Asthma Brother    Colon cancer Brother 57   Eczema Neg Hx    Immunodeficiency Neg Hx    Urticaria Neg Hx     Social  History Social History   Tobacco Use   Smoking status: Never   Smokeless tobacco: Never  Vaping Use   Vaping status: Never Used  Substance Use Topics   Alcohol use: No    Alcohol/week: 0.0 standard drinks of alcohol   Drug use: Never     Allergies   Ace inhibitors, Citrus, Codeine, Lisinopril, Other, Percocet [oxycodone-acetaminophen], Propoxyphene, and Januvia [sitagliptin]   Review of Systems Review of Systems  Respiratory:  Positive for cough.      Physical Exam Triage Vital Signs ED Triage Vitals  Encounter Vitals Group     BP 01/20/24 1902 (!) 156/90     Systolic BP Percentile --      Diastolic BP Percentile --      Pulse Rate 01/20/24 1902 77     Resp 01/20/24 1902 20     Temp 01/20/24 1902 98.1 F (36.7 C)     Temp Source 01/20/24 1902 Oral     SpO2 01/20/24 1902 97 %     Weight --      Height --      Head Circumference --      Peak Flow --      Pain Score 01/20/24 1904 0     Pain Loc --      Pain Education --      Exclude from Growth Chart --    No data found.  Updated Vital Signs BP (!) 156/90 (BP Location: Left Arm) Comment: pt reports her atenolol is due now, needs to take it  Pulse 77   Temp 98.1 F (36.7 C) (Oral)   Resp 20   SpO2 97%   Visual  Acuity Right Eye Distance:   Left Eye Distance:   Bilateral Distance:    Right Eye Near:   Left Eye Near:    Bilateral Near:     Physical Exam Vitals reviewed.  Constitutional:      General: She is not in acute distress.    Appearance: She is not toxic-appearing.  HENT:     Right Ear: Tympanic membrane and ear canal normal.     Left Ear: Tympanic membrane and ear canal normal.     Nose: Nose normal. No congestion.     Mouth/Throat:     Mouth: Mucous membranes are moist.     Comments: There is white mucus draining in the OP Eyes:     Extraocular Movements: Extraocular movements intact.     Conjunctiva/sclera: Conjunctivae normal.     Pupils: Pupils are equal, round, and reactive to light.  Cardiovascular:     Rate and Rhythm: Normal rate and regular rhythm.     Heart sounds: No murmur heard. Pulmonary:     Effort: No respiratory distress.     Breath sounds: No stridor. No rhonchi or rales.     Comments: No wheezes at the time of exam,  but exp phase is slightly prolonged Chest:     Chest wall: No tenderness.  Musculoskeletal:     Cervical back: Neck supple.  Lymphadenopathy:     Cervical: No cervical adenopathy.  Skin:    Capillary Refill: Capillary refill takes less than 2 seconds.     Coloration: Skin is not jaundiced or pale.  Neurological:     General: No focal deficit present.     Mental Status: She is alert and oriented to person, place, and time.  Psychiatric:        Behavior: Behavior normal.  UC Treatments / Results  Labs (all labs ordered are listed, but only abnormal results are displayed) Labs Reviewed - No data to display  EKG   Radiology No results found.  Procedures Procedures (including critical care time)  Medications Ordered in UC Medications - No data to display  Initial Impression / Assessment and Plan / UC Course  I have reviewed the triage vital signs and the nursing notes.  Pertinent labs & imaging results that were  available during my care of the patient were reviewed by me and considered in my medical decision making (see chart for details).     Albuterol and prednisone are sent in for asthma exacerbation, and doxycycline is sent for her acute sinusitis. Final Clinical Impressions(s) / UC Diagnoses   Final diagnoses:  Acute sinusitis, recurrence not specified, unspecified location  Mild intermittent asthma with acute exacerbation     Discharge Instructions      Albuterol inhaler--do 2 puffs every 4 hours as needed for shortness of breath or wheezing  Take prednisone 20 mg--2 daily for 5 days; this is for inflammation in your lungs.  Take doxycycline 100 mg --1 capsule 2 times daily for 7 days; this is the antibiotic  Take benzonatate 100 mg, 1 tab every 8 hours as needed for cough.       ED Prescriptions     Medication Sig Dispense Auth. Provider   albuterol (VENTOLIN HFA) 108 (90 Base) MCG/ACT inhaler Inhale 2 puffs into the lungs every 4 (four) hours as needed for wheezing or shortness of breath. 1 each Zenia Resides, MD   benzonatate (TESSALON) 100 MG capsule Take 1 capsule (100 mg total) by mouth 3 (three) times daily as needed for cough. 21 capsule Zenia Resides, MD   doxycycline (VIBRAMYCIN) 100 MG capsule Take 1 capsule (100 mg total) by mouth 2 (two) times daily for 7 days. 14 capsule Zenia Resides, MD   predniSONE (DELTASONE) 20 MG tablet Take 2 tablets (40 mg total) by mouth daily with breakfast for 5 days. 10 tablet Marlinda Mike Janace Aris, MD      PDMP not reviewed this encounter.   Zenia Resides, MD 01/21/24 [phone]

## 2024-02-08 DIAGNOSIS — Z800 Family history of malignant neoplasm of digestive organs: Secondary | ICD-10-CM | POA: Diagnosis not present

## 2024-02-08 DIAGNOSIS — R140 Abdominal distension (gaseous): Secondary | ICD-10-CM | POA: Diagnosis not present

## 2024-02-08 DIAGNOSIS — Z01419 Encounter for gynecological examination (general) (routine) without abnormal findings: Secondary | ICD-10-CM | POA: Diagnosis not present

## 2024-02-08 DIAGNOSIS — Z124 Encounter for screening for malignant neoplasm of cervix: Secondary | ICD-10-CM | POA: Diagnosis not present

## 2024-02-22 DIAGNOSIS — R140 Abdominal distension (gaseous): Secondary | ICD-10-CM | POA: Diagnosis not present

## 2024-02-22 DIAGNOSIS — D259 Leiomyoma of uterus, unspecified: Secondary | ICD-10-CM | POA: Diagnosis not present

## 2024-02-23 DIAGNOSIS — K120 Recurrent oral aphthae: Secondary | ICD-10-CM | POA: Insufficient documentation

## 2024-02-23 DIAGNOSIS — R0981 Nasal congestion: Secondary | ICD-10-CM | POA: Diagnosis not present

## 2024-02-23 DIAGNOSIS — Z1152 Encounter for screening for COVID-19: Secondary | ICD-10-CM | POA: Diagnosis not present

## 2024-02-23 DIAGNOSIS — R5383 Other fatigue: Secondary | ICD-10-CM | POA: Diagnosis not present

## 2024-02-23 DIAGNOSIS — R058 Other specified cough: Secondary | ICD-10-CM | POA: Diagnosis not present

## 2024-02-23 DIAGNOSIS — J029 Acute pharyngitis, unspecified: Secondary | ICD-10-CM | POA: Diagnosis not present

## 2024-02-23 DIAGNOSIS — E1140 Type 2 diabetes mellitus with diabetic neuropathy, unspecified: Secondary | ICD-10-CM | POA: Diagnosis not present

## 2024-02-28 DIAGNOSIS — M810 Age-related osteoporosis without current pathological fracture: Secondary | ICD-10-CM | POA: Diagnosis not present

## 2024-02-28 DIAGNOSIS — Z809 Family history of malignant neoplasm, unspecified: Secondary | ICD-10-CM | POA: Diagnosis not present

## 2024-02-28 DIAGNOSIS — Z794 Long term (current) use of insulin: Secondary | ICD-10-CM | POA: Diagnosis not present

## 2024-02-28 DIAGNOSIS — F325 Major depressive disorder, single episode, in full remission: Secondary | ICD-10-CM | POA: Diagnosis not present

## 2024-02-28 DIAGNOSIS — E1140 Type 2 diabetes mellitus with diabetic neuropathy, unspecified: Secondary | ICD-10-CM | POA: Diagnosis not present

## 2024-02-28 DIAGNOSIS — Z8249 Family history of ischemic heart disease and other diseases of the circulatory system: Secondary | ICD-10-CM | POA: Diagnosis not present

## 2024-02-28 DIAGNOSIS — Z7982 Long term (current) use of aspirin: Secondary | ICD-10-CM | POA: Diagnosis not present

## 2024-02-28 DIAGNOSIS — I499 Cardiac arrhythmia, unspecified: Secondary | ICD-10-CM | POA: Diagnosis not present

## 2024-02-28 DIAGNOSIS — E785 Hyperlipidemia, unspecified: Secondary | ICD-10-CM | POA: Diagnosis not present

## 2024-03-29 DIAGNOSIS — Z800 Family history of malignant neoplasm of digestive organs: Secondary | ICD-10-CM | POA: Diagnosis not present

## 2024-03-29 DIAGNOSIS — Z1211 Encounter for screening for malignant neoplasm of colon: Secondary | ICD-10-CM | POA: Diagnosis not present

## 2024-03-29 DIAGNOSIS — K642 Third degree hemorrhoids: Secondary | ICD-10-CM | POA: Diagnosis not present

## 2024-04-03 DIAGNOSIS — I10 Essential (primary) hypertension: Secondary | ICD-10-CM | POA: Diagnosis not present

## 2024-04-03 DIAGNOSIS — E1140 Type 2 diabetes mellitus with diabetic neuropathy, unspecified: Secondary | ICD-10-CM | POA: Diagnosis not present

## 2024-04-03 DIAGNOSIS — E7800 Pure hypercholesterolemia, unspecified: Secondary | ICD-10-CM | POA: Diagnosis not present

## 2024-04-03 DIAGNOSIS — Z1212 Encounter for screening for malignant neoplasm of rectum: Secondary | ICD-10-CM | POA: Diagnosis not present

## 2024-04-03 DIAGNOSIS — E559 Vitamin D deficiency, unspecified: Secondary | ICD-10-CM | POA: Diagnosis not present

## 2024-04-10 DIAGNOSIS — M810 Age-related osteoporosis without current pathological fracture: Secondary | ICD-10-CM | POA: Diagnosis not present

## 2024-04-10 DIAGNOSIS — Z794 Long term (current) use of insulin: Secondary | ICD-10-CM | POA: Diagnosis not present

## 2024-04-10 DIAGNOSIS — Z800 Family history of malignant neoplasm of digestive organs: Secondary | ICD-10-CM | POA: Diagnosis not present

## 2024-04-10 DIAGNOSIS — I5181 Takotsubo syndrome: Secondary | ICD-10-CM | POA: Diagnosis not present

## 2024-04-10 DIAGNOSIS — Z0000 Encounter for general adult medical examination without abnormal findings: Secondary | ICD-10-CM | POA: Diagnosis not present

## 2024-04-10 DIAGNOSIS — I10 Essential (primary) hypertension: Secondary | ICD-10-CM | POA: Diagnosis not present

## 2024-04-10 DIAGNOSIS — E7800 Pure hypercholesterolemia, unspecified: Secondary | ICD-10-CM | POA: Diagnosis not present

## 2024-04-10 DIAGNOSIS — Z1331 Encounter for screening for depression: Secondary | ICD-10-CM | POA: Diagnosis not present

## 2024-04-10 DIAGNOSIS — E1140 Type 2 diabetes mellitus with diabetic neuropathy, unspecified: Secondary | ICD-10-CM | POA: Diagnosis not present

## 2024-04-10 DIAGNOSIS — G5601 Carpal tunnel syndrome, right upper limb: Secondary | ICD-10-CM | POA: Diagnosis not present

## 2024-04-10 DIAGNOSIS — Z1339 Encounter for screening examination for other mental health and behavioral disorders: Secondary | ICD-10-CM | POA: Diagnosis not present

## 2024-04-10 DIAGNOSIS — E559 Vitamin D deficiency, unspecified: Secondary | ICD-10-CM | POA: Diagnosis not present

## 2024-04-10 DIAGNOSIS — R140 Abdominal distension (gaseous): Secondary | ICD-10-CM | POA: Diagnosis not present

## 2024-06-20 ENCOUNTER — Ambulatory Visit: Admission: EM | Admit: 2024-06-20 | Discharge: 2024-06-20 | Disposition: A | Discharge status: Home / Self Care

## 2024-06-20 ENCOUNTER — Encounter: Payer: Self-pay | Admitting: Emergency Medicine

## 2024-06-20 DIAGNOSIS — M5432 Sciatica, left side: Secondary | ICD-10-CM

## 2024-06-20 DIAGNOSIS — K921 Melena: Secondary | ICD-10-CM | POA: Insufficient documentation

## 2024-06-20 DIAGNOSIS — R140 Abdominal distension (gaseous): Secondary | ICD-10-CM | POA: Insufficient documentation

## 2024-06-20 DIAGNOSIS — K649 Unspecified hemorrhoids: Secondary | ICD-10-CM | POA: Insufficient documentation

## 2024-06-20 DIAGNOSIS — Z800 Family history of malignant neoplasm of digestive organs: Secondary | ICD-10-CM | POA: Insufficient documentation

## 2024-06-20 DIAGNOSIS — Z794 Long term (current) use of insulin: Secondary | ICD-10-CM | POA: Insufficient documentation

## 2024-06-20 DIAGNOSIS — E559 Vitamin D deficiency, unspecified: Secondary | ICD-10-CM | POA: Insufficient documentation

## 2024-06-20 DIAGNOSIS — E1142 Type 2 diabetes mellitus with diabetic polyneuropathy: Secondary | ICD-10-CM | POA: Insufficient documentation

## 2024-06-20 MED ORDER — IBUPROFEN 400 MG PO TABS: 400.0000 mg | ORAL_TABLET | Freq: Four times a day (QID) | ORAL | 0 refills | Status: AC | PRN

## 2024-06-20 MED ORDER — IBUPROFEN 400 MG PO TABS
400.0000 mg | ORAL_TABLET | Freq: Once | ORAL | Status: AC
  Administered 2024-06-20: 400 mg via ORAL

## 2024-06-20 MED ORDER — BACLOFEN 5 MG PO TABS: 5.0000 mg | ORAL_TABLET | Freq: Two times a day (BID) | ORAL | 0 refills | Status: AC | PRN

## 2024-06-20 NOTE — ED Provider Notes (Signed)
 [name]    CSN: [id number] Arrival date & time: 06/20/24  1732      History   Chief Complaint Chief Complaint  Patient presents with   Leg Injury    HPI Sharon Munoz is a 68 y.o. female.  Here with left side glute pain that radiates into left posterior thigh Started last night after she was lifting, pulling, and moving things Pain rated 10/10 currently No paresthesias or weakness No back pain. Denies bladder or bowel dysfunction   No interventions yet   Denies history of this  Past Medical History:  Diagnosis Date   Back spasm    Diabetes mellitus    Hypertension    Systolic ejection murmur 1986    Patient Active Problem List   Diagnosis Date Noted   Abdominal bloating 06/20/2024   Diabetic peripheral neuropathy associated with type 2 diabetes mellitus (HCC) 06/20/2024   Family hx of colon cancer 06/20/2024   Hematochezia 06/20/2024   Hemorrhoids 06/20/2024   Long term current use of insulin  (HCC) 06/20/2024   Vitamin D deficiency 06/20/2024   Aphthous ulcer 02/23/2024   Facial cellulitis 04/19/2023   Nonrheumatic mitral valve regurgitation 10/07/2022   LVH (left ventricular hypertrophy) 10/07/2022   Asymmetric septal hypertrophy 04/23/2022   Cardiomyopathy (HCC) 03/20/2020   Hypopituitarism (HCC) 03/15/2020   Low back pain 03/15/2020   Pure hypercholesterolemia 03/15/2020   Back muscle spasm 03/08/2020   Noncompliance with medication regimen 08/09/2019   Osteoporosis 04/06/2019   Osteopenia 04/06/2019   Hyperglycemia due to type 2 diabetes mellitus (HCC) 03/14/2019   Encounter for general adult medical examination without abnormal findings 08/23/2018   Palpitations 05/10/2018   Bilateral hand pain 02/12/2016   H. pylori infection 01/29/2016   Chronic urticaria 12/10/2015   Angioedema 12/10/2015   Chronic rhinitis 12/10/2015   WRIST PAIN, RIGHT 03/05/2009   HYPOMAGNESEMIA 10/05/2008   CATARACTS, BILATERAL 08/24/2008   Cardiac  murmur 08/24/2008   Carpal tunnel syndrome of left wrist 08/23/2008   Dyslipidemia 10/20/2007   HYPOKALEMIA 10/20/2007   Hyperlipidemia 10/20/2007   OTHER ACUTE REACTIONS TO STRESS 10/04/2007   Esophageal reflux 06/07/2007   POSTMENOPAUSAL STATUS 11/30/2006   Diabetes mellitus (HCC) 11/30/2005   Hypertension 11/30/2002    Past Surgical History:  Procedure Laterality Date   CESAREAN SECTION     RIGHT/LEFT HEART CATH AND CORONARY ANGIOGRAPHY N/A 03/08/2020   Procedure: RIGHT/LEFT HEART CATH AND CORONARY ANGIOGRAPHY;  Surgeon: Mady Bruckner, MD;  Location: [facility];  Service: Cardiovascular;  Laterality: N/A;    OB History   No obstetric history on file.      Home Medications    Prior to Admission medications   Medication Sig Start Date End Date Taking? Authorizing Provider  Baclofen  5 MG TABS Take 1 tablet (5 mg total) by mouth 2 (two) times daily as needed. 06/20/24  Yes Colene Mines, Asberry, PA-C  BISACODYL 5 MG EC tablet Take by mouth as directed. 03/27/24  Yes [provider]  ibuprofen  (ADVIL ) 400 MG tablet Take 1 tablet (400 mg total) by mouth every 6 (six) hours as needed. 06/20/24  Yes Elaine Roanhorse, Asberry, PA-C  acetaminophen  (TYLENOL ) 500 MG tablet Take 1,000 mg by mouth every 6 (six) hours as needed for mild pain.    [provider]  albuterol  (VENTOLIN  HFA) 108 (90 Base) MCG/ACT inhaler Inhale 2 puffs into the lungs every 4 (four) hours as needed for wheezing or shortness of breath. 01/20/24   Vonna Sharlet POUR, MD  amLODipine  (NORVASC ) 10 MG tablet Take 10 mg by mouth daily. 01/17/24   [provider]  Ascorbic Acid (VITAMIN C) 500 MG CHEW daily at 6 (six) AM.    [provider]  aspirin  EC 81 MG tablet Take 81 mg by mouth daily.    [provider]  atenolol  (TENORMIN ) 50 MG tablet Take 1 tablet (50 mg total) by mouth 2 (two) times daily. 10/21/21   Emelia Josefa HERO, NP  BD PEN NEEDLE NANO 2ND GEN 32G X 4 MM MISC USE WITH  TRESIBA  DAILY 90 12/18/23   [provider]  benzonatate  (TESSALON ) 100 MG capsule Take 1 capsule (100 mg total) by mouth 3 (three) times daily as needed for cough. 01/20/24   Banister, Pamela K, MD  Calcium Carb-Cholecalciferol (CALCIUM 500 + D3) 500-15 MG-MCG TABS 2 (two) times a week.    [provider]  cetirizine (ZYRTEC) 10 MG chewable tablet Chew 10 mg by mouth as needed for allergies. 03/06/20   [provider]  Cholecalciferol (VITAMIN D3 PO) Take 1 capsule by mouth 4 (four) times a week. [employer] Brand    [provider]  clindamycin (CLEOCIN T) 1 % external solution Apply 1 Application topically 2 (two) times daily. 01/13/24   [provider]  empagliflozin  (JARDIANCE ) 25 MG TABS tablet Take 1 tablet (25 mg total) by mouth daily. 11/20/22   Emelia Josefa HERO, NP  escitalopram  (LEXAPRO ) 20 MG tablet Take 1 tablet (20 mg total) by mouth daily. 03/09/20 04/19/23  Samtani, Jai-Gurmukh, MD  fluticasone  (FLONASE ) 50 MCG/ACT nasal spray Place 1 spray into both nostrils daily. 12/27/22   Hazen Darryle BRAVO, FNP  gabapentin  (NEURONTIN ) 100 MG capsule Take 3 capsules (300 mg total) by mouth 3 (three) times daily. 02/12/16   Onita Duos, MD  gatifloxacin (ZYMAXID) 0.5 % SOLN Place into both eyes daily at 6 (six) AM. 04/08/23   [provider]  hydrochlorothiazide  (MICROZIDE ) 12.5 MG capsule Take 12.5 mg by mouth daily. 04/06/23   [provider]  ibandronate (BONIVA) 150 MG tablet every 30 (thirty) days. Patient not taking: Reported on 04/19/2023    [provider]  ketorolac (ACULAR) 0.5 % ophthalmic solution Place 1 drop into the right eye 4 (four) times daily. Patient not taking: Reported on 01/20/2024 04/06/23   [provider]  KLOR-CON  M10 10 MEQ tablet Take 10 mEq by mouth 3 (three) times a week. For 30 days 02/11/22   [provider]  levocetirizine (XYZAL ) 5 MG tablet Take 5 mg by mouth as needed for allergies.    [provider]  Magnesium  250 MG TABS daily.    [provider]  Multiple Vitamins-Minerals (HAIR SKIN NAILS PO) Take 1 capsule by mouth 3 (three) times a week.    [provider]  Omega-3 Fatty Acids (FISH OIL) 1000 MG CAPS Take 1,000 mg by mouth 2 (two) times a week.    [provider]  pravastatin  (PRAVACHOL ) 20 MG tablet TAKE 1 TABLET BY MOUTH EVERY DAY 12/14/22   Emelia Josefa HERO, NP  prednisoLONE acetate (PRED FORTE) 1 % ophthalmic suspension Place into both eyes daily at 6 (six) AM. 04/06/23   [provider]  TRESIBA  FLEXTOUCH 100 UNIT/ML FlexTouch Pen Inject 36 Units into the skin daily. 06/20/20   [provider]    Family History Family History  Problem Relation Age of Onset   Hypertension Mother    Asthma Father    Hypertension Father  Kidney cancer Father    CVA Father 22   Asthma Brother    Colon cancer Brother 29   Eczema Neg Hx    Immunodeficiency Neg Hx    Urticaria Neg Hx     Social History Social History   Tobacco Use   Smoking status: Never    Passive exposure: Never   Smokeless tobacco: Never  Vaping Use   Vaping status: Never Used  Substance Use Topics   Alcohol  use: No    Alcohol /week: 0.0 standard drinks of alcohol    Drug use: Never     Allergies   Ace inhibitors, Citrus, Codeine, Lisinopril, Other, Percocet [oxycodone-acetaminophen ], Propoxyphene, and Januvia [sitagliptin]   Review of Systems Review of Systems As per HPI   Physical Exam Triage Vital Signs ED Triage Vitals  Encounter Vitals Group     BP 06/20/24 1751 (!) 160/78     Girls Systolic BP Percentile --      Girls Diastolic BP Percentile --      Boys Systolic BP Percentile --      Boys Diastolic BP Percentile --      Pulse Rate 06/20/24 1751 74     Resp 06/20/24 1751 18     Temp 06/20/24 1751 98.5 F (36.9 C)     Temp Source 06/20/24 1751 Oral     SpO2 06/20/24 1751 97 %     Weight 06/20/24 1750 148 lb 9.4 oz (67.4 kg)      Height --      Head Circumference --      Peak Flow --      Pain Score 06/20/24 1749 10     Pain Loc --      Pain Education --      Exclude from Growth Chart --    No data found.  Updated Vital Signs BP (!) 153/82 (BP Location: Left Arm)   Pulse 74   Temp 98.5 F (36.9 C) (Oral)   Resp 18   Wt 148 lb 9.4 oz (67.4 kg)   SpO2 97%   BMI 26.32 kg/m   Physical Exam Vitals and nursing note reviewed.  Constitutional:      General: She is not in acute distress. HENT:     Mouth/Throat:     Pharynx: Oropharynx is clear.  Cardiovascular:     Rate and Rhythm: Normal rate and regular rhythm.     Pulses: Normal pulses.     Heart sounds: Normal heart sounds.  Pulmonary:     Effort: Pulmonary effort is normal.     Breath sounds: Normal breath sounds.  Musculoskeletal:     Cervical back: Normal range of motion.     Lumbar back: No tenderness. Positive left straight leg raise test.     Comments: No bony tenderness C-L spine. No paraspinal tenderness. +L SLR  Skin:    General: Skin is warm and dry.     Capillary Refill: Capillary refill takes less than 2 seconds.  Neurological:     Mental Status: She is alert and oriented to person, place, and time.     Gait: Gait abnormal (antalgic).     Comments: Strength and sensation equal, intact     UC Treatments / Results  Labs (all labs ordered are listed, but only abnormal results are displayed) Labs Reviewed - No data to display  EKG  Radiology No results found.  Procedures Procedures   Medications Ordered in UC Medications  ibuprofen  (ADVIL ) tablet 400 mg (400 mg Oral  Given 06/20/24 1915)    Initial Impression / Assessment and Plan / UC Course  I have reviewed the triage vital signs and the nursing notes.  Pertinent labs & imaging results that were available during my care of the patient were reviewed by me and considered in my medical decision making (see chart for details).  Left sided sciatica No red  flags Neurologically intact  Patient without history of kidney disease, good function per labs.  Ibuprofen  dose given in clinic Continue at home q6 hours prn. Avoiding steroids due to uncontrolled DM. Try baclofen , advised drowsy precautions Will call her PCP tomorrow to make follow up Return precautions. All questions answered   Final Clinical Impressions(s) / UC Diagnoses   Final diagnoses:  Left sided sciatica     Discharge Instructions      Ibuprofen  -- one 400 mg tablet every 6 hours as needed for pain  Baclofen  -- one 5 mg tablet once or twice daily for muscle spasm. Please use caution as this may make you drowsy. If so, take only at bedtime.  Please call your primary care provider for follow up visit      ED Prescriptions     Medication Sig Dispense Auth. Provider   ibuprofen  (ADVIL ) 400 MG tablet Take 1 tablet (400 mg total) by mouth every 6 (six) hours as needed. 30 tablet Jhaniya Briski, PA-C   Baclofen  5 MG TABS Take 1 tablet (5 mg total) by mouth 2 (two) times daily as needed. 20 tablet Jasmen Emrich, Asberry, PA-C      PDMP not reviewed this encounter.   Ladarian Bonczek, PA-C 06/20/24 [id number]

## 2024-06-20 NOTE — Discharge Instructions (Addendum)
 Ibuprofen  -- one 400 mg tablet every 6 hours as needed for pain  Baclofen  -- one 5 mg tablet once or twice daily for muscle spasm. Please use caution as this may make you drowsy. If so, take only at bedtime.  Please call your primary care provider for follow up visit

## 2024-06-20 NOTE — ED Triage Notes (Signed)
 Pt presents c/o left leg injury. Pt believes she has pulled a muscle on the back of her left thigh last night.

## 2024-07-03 ENCOUNTER — Ambulatory Visit: Attending: Cardiology | Admitting: Cardiology

## 2024-07-03 ENCOUNTER — Encounter: Payer: Self-pay | Admitting: Cardiology

## 2024-07-03 VITALS — BP 130/78 | HR 72 | Ht 63.0 in | Wt 147.0 lb

## 2024-07-03 DIAGNOSIS — I517 Cardiomegaly: Secondary | ICD-10-CM | POA: Diagnosis not present

## 2024-07-03 DIAGNOSIS — E785 Hyperlipidemia, unspecified: Secondary | ICD-10-CM | POA: Diagnosis not present

## 2024-07-03 DIAGNOSIS — R072 Precordial pain: Secondary | ICD-10-CM

## 2024-07-03 DIAGNOSIS — I429 Cardiomyopathy, unspecified: Secondary | ICD-10-CM | POA: Diagnosis not present

## 2024-07-03 DIAGNOSIS — I10 Essential (primary) hypertension: Secondary | ICD-10-CM

## 2024-07-03 MED ORDER — METOPROLOL TARTRATE 100 MG PO TABS: 100.0000 mg | ORAL_TABLET | Freq: Once | ORAL | 0 refills | Status: DC

## 2024-07-03 NOTE — Progress Notes (Signed)
 Cardiology Office Note    Date:  07/03/2024  ID:  Sharon Munoz, DOB Apr 11, 1956, MRN [id number] PCP:  Sharon Charlie ORN, MD  Cardiologist:  Sharon Lesches, MD  Electrophysiologist:  None   Chief Complaint: Chest pain   History of Present Illness: .    Sharon Munoz is a 68 y.o. female with visit-pertinent history of hypertension with diabetes mellitus, LVH, carpal tunnel and LVOT gradient in 2021.  Patient's cardiac MRI previously not consistent with HCM or cardiac amyloidosis.  Patient was admitted to the hospital with chest pain in 02/2020, enzymes rose minimally.  2D echo showed apical wall motion and normalities with basal hypokinesia and dynamic outflow tract gradient.  Cardiac cath was performed revealing normal coronary arteries.  Possibility of Takotsubo syndrome was raised as well as HOCM.  Echo in 02/2022 indicated LVEF 65 to 70%, no RWMA, severe asymmetric LVH of the basal septal segment, G1 DD.  She was referred to Dr. Santo, she underwent cardiac MRI that was not consistent with HCM or cardiac amyloidosis.  Echocardiogram on 03/17/2023 indicated LVEF of 55 to 60%, no RWMA, G1 DD, RV systolic function and size was normal, mild mitral valve regurgitation with no evidence of stenosis, aortic valve regurgitation was mild with no evidence of stenosis.  Patient was last seen in clinic by Dr. Santo on 04/12/2023, she was doing well at that time.  She denied any chest pain or pressure, denies shortness of breath, lower extremity edema, orthopnea or PND.  Today she presents for follow-up.  She reports that she has been doing well overall. She report two episodes of chest discomfort in the last week. Reports her first episodes occurred after working upstairs in a hot environment, she drank a cold beverage consisting of an orange beverage mixed with lemonade.  She then felt a discomfort under her left breast that traveled to her mid sternum, she describes it as a gas pain  however denies any feeling of acid reflux or belching.  She reports that resolved after a few seconds.  She notes that the second episode was not associated exertion and occurred on Saturday again only lasting for a few seconds then resolved.  She denied any associated symptoms.  She reports that yesterday she went for a walk downtown however did not walk as fast that she typically gated, denied any exertional symptoms.  She denies any palpitations, presyncope or syncope.  She denies any lower extremity edema, orthopnea or PND.  Patient notes that she has been extremely busy as of late caring for different family members, wants to ensure that she is not currently having a heart problem.  ROS: .   Today she denies shortness of breath, lower extremity edema, fatigue, palpitations, melena, hematuria, hemoptysis, diaphoresis, weakness, presyncope, syncope, orthopnea, and PND.  All other systems are reviewed and otherwise negative. Studies Reviewed: Sharon Munoz   EKG:  EKG is ordered today, personally reviewed, demonstrating  EKG Interpretation Date/Time:  Monday July 03 2024 13:53:37 EDT Ventricular Rate:  70 PR Interval:  194 QRS Duration:  82 QT Interval:  382 QTC Calculation: 412 R Axis:   -26  Text Interpretation: Normal sinus rhythm Minimal voltage criteria for LVH, may be normal variant ( R in aVL ) Confirmed by Sharon Munoz [phone]) on 07/03/2024 5:56:29 PM   CV Studies: Cardiac studies reviewed are outlined and summarized above. Otherwise please see EMR for full report. Cardiac Studies & Procedures   ______________________________________________________________________________________________ CARDIAC CATHETERIZATION  CARDIAC CATHETERIZATION 03/08/2020  Conclusion Conclusions: 1. Tortuous coronary arteries without angiographically significant stenosis. 2. Upper normal to mildly elevated left heart and pulmonary artery pressures. 3. Normal right heart filling pressures. 4. Normal cardiac  output/index. 5. Dynamic LVOT gradient of 25 mmHg at rest and 65-70 mmHg post-PVC.  Recommendations: 1. Primary prevention of coronary artery disease. 2. Hold amlodipine  and increase metoprolol  tartrate to 100 mg twice daily to improve rate control.  Sharon Hanson, MD [facility]  Findings Coronary Findings Diagnostic  Dominance: Right  Left Main Vessel is large.  Left Anterior Descending Vessel is large. Vessel is angiographically normal. The vessel is severely tortuous.  First Diagonal Branch Vessel is moderate in size.  Second Diagonal Branch Vessel is small in size.  Third Diagonal Branch Vessel is moderate in size.  Left Circumflex Vessel is large. Vessel is angiographically normal. The vessel is moderately tortuous.  First Obtuse Marginal Branch Vessel is large in size.  Second Obtuse Marginal Branch Vessel is small in size.  Third Obtuse Marginal Branch Vessel is small in size.  Right Coronary Artery Vessel is large. Vessel is angiographically normal.  Right Posterior Descending Artery Vessel is moderate in size. The vessel is tortuous.  Right Posterior Atrioventricular Artery Vessel is moderate in size.  Intervention  No interventions have been documented.   STRESS TESTS  ECHOCARDIOGRAM STRESS TEST 10/28/2022  Narrative EXERCISE STRESS ECHO REPORT   --------------------------------------------------------------------------------  Patient Name:   Sharon Munoz Date of Exam: 10/28/2022 Medical Rec #:  [id number]        Height:       63.0 in Accession #:    [id number]       Weight:       153.0 lb Date of Birth:  14-Mar-1956         BSA:          1.726 m Patient Age:    68 years         BP:           143/86 mmHg Patient Gender: F                HR:           65 bpm. Exam Location:  [address]  Procedure: Stress Echo  Indications:    I34.0 Nonrheumatic mitral (valve) insufficiency Exercise associated mitral regurgitation,  Inducible LVOT gradient  History:        Patient has prior history of Echocardiogram examinations, most recent 03/19/2022. Dyspnea on exertion. Asymmetric septal hypertrophy. LVH.  Sonographer:    Sharon Munoz RCS Referring Phys: [id number] [facility] A CHANDRASEKHAR  IMPRESSIONS   1. Baseline AV sclerosis with mild AR and trivial AR. 2. Baseline ECG with LVH no SAM but appeared to be some apposition of basal septum and hypertrophied papillary muscle. 3. Poor exercise tolerance only achieved 5.4 METS with maximum HR 106 bom Negative ETT for ischemia. 4. With stress peak LVOT gradient 12 mmHg and no change in MR. 5. This is a negative stress echocardiogram for ischemia. 6. This is a low risk study.  FINDINGS  Exam Protocol: The patient exercised on a treadmill according to a Bruce protocol.   Patient Performance: The patient exercised for 3 minutes and 44 seconds, achieving 5.4 METS. The maximum stage achieved was I of the Bruce protocol. The heart rate at peak stress was 103 bpm. The target heart rate was calculated to be 130 bpm. The percentage of maximum predicted heart rate achieved was 67.1 %. The baseline blood  pressure was 143/86 mmHg. The blood pressure at peak stress was 173/94 mmHg. The blood pressure response was normal. The patient developed fatigue during the stress exam. Patient only exercised 3:45 minutes with maximum HR of 106 bpm.  EKG: Resting EKG showed normal sinus rhythm with no abnormal findings. The patient developed no abnormal EKG findings during exercise.   2D Echo Findings: Baseline regional wall motion abnormalities were not present. There were no stress-induced wall motion abnormalities. This is a negative stress echocardiogram for ischemia.  Additional Findings: Poor exercise tolerance only achieved 5.4 METS with maximum HR 106 bom Negative ETT for ischemia. Baseline ECG with LVH no SAM but appeared to be some apposition of basal septum and hypertrophied  papillary muscle. Baseline AV sclerosis with mild AR and trivial AR. With stress peak LVOT gradient 12 mmHg and no change in MR.   Maude Emmer MD Electronically signed on 10/28/2022 at 4:50:25 PM     Final   ECHOCARDIOGRAM  ECHOCARDIOGRAM COMPLETE 03/17/2023  Narrative ECHOCARDIOGRAM REPORT    Patient Name:   Sharon Munoz Date of Exam: 03/17/2023 Medical Rec #:  [id number]        Height:       63.0 in Accession #:    [id number]       Weight:       148.0 lb Date of Birth:  1956/09/23         BSA:          1.701 m Patient Age:    68 years         BP:           159/83 mmHg Patient Gender: F                HR:           54 bpm. Exam Location:  Outpatient  Procedure: 2D Echo, Cardiac Doppler, Color Doppler, 3D Echo and Strain Analysis  Indications:    Essential hypertension [257793]  History:        Patient has prior history of Echocardiogram examinations, most recent 10/28/2022. Risk Factors:Diabetes, Dyslipidemia and Hypertension.  Sonographer:    Lauraine Pilot RDCS Referring Phys: [id number] JONATHAN J BERRY   Sonographer Comments: Global longitudinal strain was attempted. IMPRESSIONS   1. Left ventricular ejection fraction, by estimation, is 55 to 60%. Left ventricular ejection fraction by 3D volume is 56 %. The left ventricle has normal function. The left ventricle has no regional wall motion abnormalities. Left ventricular diastolic parameters are consistent with Grade I diastolic dysfunction (impaired relaxation). The average left ventricular global longitudinal strain is -20.0 %. The global longitudinal strain is normal. 2. Right ventricular systolic function is normal. The right ventricular size is normal. 3. The mitral valve is normal in structure. Mild mitral valve regurgitation. No evidence of mitral stenosis. 4. The aortic valve is tricuspid. Aortic valve regurgitation is mild. No aortic stenosis is present. Aortic regurgitation PHT measures 489 msec. 5. The  inferior vena cava is normal in size with greater than 50% respiratory variability, suggesting right atrial pressure of 3 mmHg.  Comparison(s): Prior images reviewed side by side.  FINDINGS Left Ventricle: Left ventricular ejection fraction, by estimation, is 55 to 60%. Left ventricular ejection fraction by 3D volume is 56 %. The left ventricle has normal function. The left ventricle has no regional wall motion abnormalities. The average left ventricular global longitudinal strain is -20.0 %. The global longitudinal strain is normal. The left ventricular internal cavity size was  normal in size. There is no left ventricular hypertrophy. Left ventricular diastolic parameters are consistent with Grade I diastolic dysfunction (impaired relaxation).  Right Ventricle: The right ventricular size is normal. No increase in right ventricular wall thickness. Right ventricular systolic function is normal.  Left Atrium: Left atrial size was normal in size.  Right Atrium: Right atrial size was normal in size.  Pericardium: There is no evidence of pericardial effusion.  Mitral Valve: The mitral valve is normal in structure. Mild mitral valve regurgitation. No evidence of mitral valve stenosis.  Tricuspid Valve: The tricuspid valve is normal in structure. Tricuspid valve regurgitation is mild . No evidence of tricuspid stenosis.  Aortic Valve: The aortic valve is tricuspid. Aortic valve regurgitation is mild. Aortic regurgitation PHT measures 489 msec. No aortic stenosis is present.  Pulmonic Valve: The pulmonic valve was normal in structure. Pulmonic valve regurgitation is not visualized. No evidence of pulmonic stenosis.  Aorta: The aortic root is normal in size and structure.  Venous: The inferior vena cava is normal in size with greater than 50% respiratory variability, suggesting right atrial pressure of 3 mmHg.  IAS/Shunts: No atrial level shunt detected by color flow Doppler.   LEFT  VENTRICLE PLAX 2D LVIDd:         3.80 cm         Diastology LVIDs:         2.40 cm         LV e' medial:    6.53 cm/s LV PW:         1.10 cm         LV E/e' medial:  12.4 LV IVS:        1.10 cm         LV e' lateral:   9.38 cm/s LVOT diam:     1.80 cm         LV E/e' lateral: 8.6 LV SV:         72 LV SV Index:   42              2D LVOT Area:     2.54 cm        Longitudinal Strain 2D Strain GLS  -20.0 % LV Volumes (MOD)               Avg: LV vol d, MOD    83.8 ml A2C:                           3D Volume EF LV vol d, MOD    89.9 ml       LV 3D EF:    Left A4C:                                        ventricul LV vol s, MOD    27.5 ml                    ar A2C:                                        ejection LV vol s, MOD    27.6 ml  fraction A4C:                                        by 3D LV SV MOD A2C:   56.3 ml                    volume is LV SV MOD A4C:   89.9 ml                    56 %. LV SV MOD BP:    62.8 ml  3D Volume EF: 3D EF:        56 % LV EDV:       146 ml LV ESV:       65 ml LV SV:        81 ml  RIGHT VENTRICLE RV S prime:     10.80 cm/s TAPSE (M-mode): 2.3 cm  LEFT ATRIUM             Index        RIGHT ATRIUM           Index LA diam:        3.70 cm 2.17 cm/m   RA Area:     12.00 cm LA Vol (A2C):   34.9 ml 20.51 ml/m  RA Volume:   26.50 ml  15.58 ml/m LA Vol (A4C):   39.8 ml 23.39 ml/m LA Biplane Vol: 38.0 ml 22.33 ml/m AORTIC VALVE LVOT Vmax:         140.00 cm/s LVOT Vmean:        84.400 cm/s LVOT VTI:          0.283 m AI PHT:            489 msec AR Vena Contracta: 0.20 cm  AORTA Ao Root diam: 3.00 cm Ao Asc diam:  3.70 cm  MITRAL VALVE MV Area (PHT): 2.82 cm    SHUNTS MV Decel Time: 269 msec    Systemic VTI:  0.28 m MV E velocity: 80.90 cm/s  Systemic Diam: 1.80 cm MV A velocity: 94.00 cm/s MV E/A ratio:  0.86  Oneil Parchment MD Electronically signed by Oneil Parchment MD Signature Date/Time: 03/22/2023/12:47:42  PM    Final    MONITORS  LONG TERM MONITOR (3-14 DAYS) 05/28/2022  Narrative  Patient had a minimum heart rate of 31 bpm, maximum heart rate of 125 bpm, and average heart rate of 75 bpm.  Predominant underlying rhythm was sinus rhythm.  One four beat run of NSVT with a max rate of 109 bpm at fastest.  Isolated PACs were rare (<1.0%).  Isolated PVCs were rare (<1.0%).  Mobitz Type I heart block seen (7 AM, heart rate 31 bpm).  Triggered and diary events associated with sinus rhythm.  Asymptomatic morning Mobitz Type I heart block.     CARDIAC MRI  MR CARDIAC MORPHOLOGY W WO CONTRAST 07/14/2022  Narrative CLINICAL DATA:  Clinical question of hypertrophic cardiomyopathy Study assumes HCT of 42 and BSA of 1.75 m2.  EXAM: CARDIAC MRI  TECHNIQUE: The patient was scanned on a 1.5 Tesla GE magnet. A dedicated cardiac coil was used. Functional imaging was done using Fiesta sequences. 2,3, and 4 chamber views were done to assess for RWMA's. Modified Simpson's rule using a short axis stack was used to calculate an ejection fraction on a dedicated work [employer]. The  patient received 10 cc of Gadavist . After 10 minutes inversion recovery sequences were used to assess for infiltration and scar tissue.  CONTRAST:  10 cc  of Gadavist   FINDINGS: 1. Normal left ventricular size, with LVEDD 54 mm, and LVEDVi 78 mL/m2.  Mild asymmetric remodeling, with mid-intraventricular septal thickness of 12 mm, posterior wall thickness of 8 mm, and myocardial mass index of 55 g/m2.  Normal left ventricular systolic function (LVEF =64%). There are no regional wall motion abnormalities. There is no systolic motion of the anterior valve.  Left ventricular parametric mapping notable for normal T2 and ECV.  There is no late gadolinium enhancement in the left ventricular myocardium.  2. Normal right ventricular size with RVEDVI 75 mL/m2.  Normal right ventricular  thickness.  Normal right ventricular systolic function (RVEF =52%). There are no regional wall motion abnormalities or aneurysms.  3.  Normal left and right atrial size.  4. Normal size of the aortic root, ascending aorta and pulmonary artery.  5. Valve assessment:  Aortic Valve: Tri-leaflet aortic valve. No aortic stenosis. Regurgitant fraction 1 %. No significant regurgitation.  Pulmonic Valve: Regurgitant fraction 1 %. No significant regurgitation.  Tricuspid Valve: Regurgitant fraction 7 %. Mild central regurgitation.  Mitral Valve: Regurgitant fraction 8 %.  Mild central regurgitation.  6.  Normal pericardium.  No pericardial effusion.  7. Grossly, no extracardiac findings. Recommended dedicated study if concerned for non-cardiac pathology.  IMPRESSION: Study does not meet diagnostic criteria for hypertrophic cardiomyopathy.  Stanly Leavens MD   Electronically Signed By: Stanly Leavens M.D. On: 07/14/2022 21:36   ______________________________________________________________________________________________       Current Reported Medications:.    Current Meds  Medication Sig   acetaminophen  (TYLENOL ) 500 MG tablet Take 1,000 mg by mouth every 6 (six) hours as needed for mild pain.   albuterol  (VENTOLIN  HFA) 108 (90 Base) MCG/ACT inhaler Inhale 2 puffs into the lungs every 4 (four) hours as needed for wheezing or shortness of breath.   amLODipine  (NORVASC ) 10 MG tablet Take 10 mg by mouth daily.   Ascorbic Acid (VITAMIN C) 500 MG CHEW daily at 6 (six) AM.   aspirin  EC 81 MG tablet Take 81 mg by mouth daily.   atenolol  (TENORMIN ) 50 MG tablet Take 1 tablet (50 mg total) by mouth 2 (two) times daily.   Baclofen  5 MG TABS Take 1 tablet (5 mg total) by mouth 2 (two) times daily as needed.   BD PEN NEEDLE NANO 2ND GEN 32G X 4 MM MISC USE WITH TRESIBA  DAILY 90   benzonatate  (TESSALON ) 100 MG capsule Take 1 capsule (100 mg total) by mouth 3 (three)  times daily as needed for cough.   BISACODYL 5 MG EC tablet Take by mouth as directed.   Calcium Carb-Cholecalciferol (CALCIUM 500 + D3) 500-15 MG-MCG TABS 2 (two) times a week.   cetirizine (ZYRTEC) 10 MG chewable tablet Chew 10 mg by mouth as needed for allergies.   Cholecalciferol (VITAMIN D3 PO) Take 1 capsule by mouth 4 (four) times a week. [employer] Brand   clindamycin (CLEOCIN T) 1 % external solution Apply 1 Application topically 2 (two) times daily.   empagliflozin  (JARDIANCE ) 25 MG TABS tablet Take 1 tablet (25 mg total) by mouth daily.   escitalopram  (LEXAPRO ) 20 MG tablet Take 1 tablet (20 mg total) by mouth daily.   fluticasone  (FLONASE ) 50 MCG/ACT nasal spray Place 1 spray into both nostrils daily.   gabapentin  (NEURONTIN ) 100 MG capsule Take 3 capsules (  300 mg total) by mouth 3 (three) times daily.   gatifloxacin (ZYMAXID) 0.5 % SOLN Place into both eyes daily at 6 (six) AM.   hydrochlorothiazide  (MICROZIDE ) 12.5 MG capsule Take 12.5 mg by mouth daily.   ibandronate (BONIVA) 150 MG tablet every 30 (thirty) days.   ibuprofen  (ADVIL ) 400 MG tablet Take 1 tablet (400 mg total) by mouth every 6 (six) hours as needed.   ketorolac (ACULAR) 0.5 % ophthalmic solution Place 1 drop into the right eye 4 (four) times daily.   KLOR-CON  M10 10 MEQ tablet Take 10 mEq by mouth 3 (three) times a week. For 30 days   levocetirizine (XYZAL ) 5 MG tablet Take 5 mg by mouth as needed for allergies.   Magnesium  250 MG TABS daily.   metoprolol  tartrate (LOPRESSOR ) 100 MG tablet Take 1 tablet (100 mg total) by mouth once for 1 dose.   Multiple Vitamins-Minerals (HAIR SKIN NAILS PO) Take 1 capsule by mouth 3 (three) times a week.   Omega-3 Fatty Acids (FISH OIL) 1000 MG CAPS Take 1,000 mg by mouth 2 (two) times a week.   pravastatin  (PRAVACHOL ) 20 MG tablet TAKE 1 TABLET BY MOUTH EVERY DAY   prednisoLONE acetate (PRED FORTE) 1 % ophthalmic suspension Place into both eyes daily at 6 (six) AM.   TRESIBA   FLEXTOUCH 100 UNIT/ML FlexTouch Pen Inject 36 Units into the skin daily.    Physical Exam:    VS:  BP 130/78   Pulse 72   Ht 5' 3 (1.6 m)   Wt 147 lb (66.7 kg)   SpO2 95%   BMI 26.04 kg/m    Wt Readings from Last 3 Encounters:  07/03/24 147 lb (66.7 kg)  06/20/24 148 lb 9.4 oz (67.4 kg)  08/05/23 148 lb 9.4 oz (67.4 kg)    GEN: Well nourished, well developed in no acute distress NECK: No JVD; No carotid bruits CARDIAC: RRR, no murmurs, rubs, gallops RESPIRATORY:  Clear to auscultation without rales, wheezing or rhonchi  ABDOMEN: Soft, non-tender, non-distended EXTREMITIES:  No edema; No acute deformity     Asessement and Plan:.    Precordial pain: Patient notes 2 episodes of chest discomfort in the last week, 1 appears to be close to timing of exertion.  Notes that both episodes resolved within a few seconds, both episodes were described as a sensation of gas pain under the left breast that moved into the middle of the chest and upper sternum.  She denied any associated symptoms.  Patient agreeable to further evaluation with coronary CTA.  She will take metoprolol  prior to testing, discussed possible need for nitroglycerin  with testing, patient is in agreement with plan.  Check CBC and BMET today. Reviewed ED precautions.  Continue amlodipine  10 mg daily, aspirin  81 mg daily, atenolol  50 mg twice daily, Jardiance  25 mg daily, pravastatin  20 mg daily.  Hypertension: Initial blood pressure today 136/76, on recheck was 130/78. Continue current antihypertensive regimen.  Dyslipidemia: Patient reports that she recently had her annual physical with fasting lipid profile.  Will reach out to PCP for her lab work.  Continue pravastatin .  History of stress cardiomyopathy: Patient presented with chest pain in 2021, echo and clean cath indicated possible Takotsubo syndrome.  Resolved on following echocardiograms. Today she reports precordial pain as noted above, she denies any shortness of  breath, lower extremity edema, orthopnea or PND.  Basal septal hypertrophy with prior LVOT obstruction: Per notes morphologically more consistent with basal septal hypertrophy, peak gradient of 17  mmHg.  Patient evaluated by Dr. Santo previously, felt not consistent with HCM or cardiac amyloidosis.  Patient reports that she stays very well-hydrated, denies any presyncope or syncope.  Mild AI: Noted on echocardiogram in 03/2023.  She denies any shortness of breath, lower extremity edema, orthopnea or PND.   Disposition: F/u with Kataleah Bejar, NP in 6-8 weeks.   Signed, Milayna Rotenberg D Azalynn Maxim, NP

## 2024-07-03 NOTE — Patient Instructions (Addendum)
 Medication Instructions:  Metoprolol  tartrate 100 mg PO 90-120 minutes prior to CT scan *If you need a refill on your cardiac medications before your next appointment, please call your pharmacy*  Lab Work: Today we are going to draw a CBC and Bmet If you have labs (blood work) drawn today and your tests are completely normal, you will receive your results only by: MyChart Message (if you have MyChart) OR A paper copy in the mail If you have any lab test that is abnormal or we need to change your treatment, we will call you to review the results.  Testing/Procedures: Next page  Follow-Up: At [facility], you and your health needs are our priority.  As part of our continuing mission to provide you with exceptional heart care, our providers are all part of one team.  This team includes your primary Cardiologist (physician) and Advanced Practice Providers or APPs (Physician Assistants and Nurse Practitioners) who all work together to provide you with the care you need, when you need it.  Your next appointment:   6-8 week(s)  Provider:   Katlyn West, NP  We recommend signing up for the patient portal called MyChart.  Sign up information is provided on this After Visit Summary.  MyChart is used to connect with patients for Virtual Visits (Telemedicine).  Patients are able to view lab/test results, encounter notes, upcoming appointments, etc.  Non-urgent messages can be sent to your provider as well.   To learn more about what you can do with MyChart, go to [email or web address].   Other Instructions   Your cardiac CT will be scheduled at one of the below locations:   [facility] [address] [place], [place] [address] [phone]  OR   [facility] [address]  [place], [place] [address]  If scheduled at [facility], please arrive at the [facility] and Children's Entrance (Entrance C2) of [facility] 30  minutes prior to test start time. You can use the FREE valet parking offered at entrance C (encouraged to control the heart rate for the test)  Proceed to the [facility] Radiology Department (first floor) to check-in and test prep.  All radiology patients and guests should use entrance C2 at [facility], accessed from [facility], even though the hospital's physical address listed is [address].  If scheduled at the Heart and Vascular Tower at [address], please enter the parking lot using the [address] entrance and use the FREE valet service at the patient drop-off area. Enter the building and check-in with registration on the main floor.  Please follow these instructions carefully (unless otherwise directed):  An IV will be required for this test and Nitroglycerin  will be given.  On the Night Before the Test: Be sure to Drink plenty of water. Do not consume any caffeinated/decaffeinated beverages or chocolate 12 hours prior to your test. Do not take any antihistamines 12 hours prior to your test.  On the Day of the Test: Drink plenty of water until 1 hour prior to the test. Do not eat any food 1 hour prior to test. You may take your regular medications prior to the test.  Take metoprolol  (Lopressor ) two hours prior to test. If you take Furosemide/Hydrochlorothiazide /Spironolactone/Chlorthalidone, please HOLD on the morning of the test. Patients who wear a continuous glucose monitor MUST remove the device prior to scanning. FEMALES- please wear underwire-free bra if available, avoid dresses & tight  clothing      After the Test: Drink plenty of water. After receiving IV contrast, you may experience a mild flushed feeling. This is normal. On occasion, you may experience a mild rash up to 24 hours after the test. This is not dangerous. If this occurs, you can take Benadryl  25 mg, Zyrtec, Claritin , or Allegra and increase your fluid intake.  (Patients taking Tikosyn should avoid Benadryl , and may take Zyrtec, Claritin , or Allegra) If you experience trouble breathing, this can be serious. If it is severe call 911 IMMEDIATELY. If it is mild, please call our office.  We will call to schedule your test 2-4 weeks out understanding that some insurance companies will need an authorization prior to the service being performed.   For more information and frequently asked questions, please visit our website : [email or web address]  For non-scheduling related questions, please contact the cardiac imaging nurse navigator should you have any questions/concerns: Cardiac Imaging Nurse Navigators Direct Office Dial: [phone]   For scheduling needs, including cancellations and rescheduling, please call [place], [phone].

## 2024-07-04 ENCOUNTER — Ambulatory Visit: Payer: Self-pay | Admitting: Cardiology

## 2024-07-04 ENCOUNTER — Telehealth: Payer: Self-pay | Admitting: Internal Medicine

## 2024-07-04 LAB — BASIC METABOLIC PANEL WITH GFR
BUN/Creatinine Ratio: 15 (ref 12–28)
BUN: 8 mg/dL (ref 8–27)
CO2: 25 mmol/L (ref 20–29)
Calcium: 9.2 mg/dL (ref 8.7–10.3)
Chloride: 102 mmol/L (ref 96–106)
Creatinine, Ser: 0.53 mg/dL — AB (ref 0.57–1.00)
Glucose: 134 mg/dL — AB (ref 70–99)
Potassium: 3.5 mmol/L (ref 3.5–5.2)
Sodium: 143 mmol/L (ref 134–144)
eGFR: 101 mL/min/1.73 (ref >59)

## 2024-07-04 LAB — CBC
Hematocrit: 42.7 % (ref 34.0–46.6)
Hemoglobin: 14 g/dL (ref 11.1–15.9)
MCH: 30.1 pg (ref 26.6–33.0)
MCHC: 32.8 g/dL (ref 31.5–35.7)
MCV: 92 fL (ref 79–97)
Platelets: 263 x10E3/uL (ref 150–450)
RBC: 4.65 x10E6/uL (ref 3.77–5.28)
RDW: 13.1 % (ref 11.7–15.4)
WBC: 6.5 x10E3/uL (ref 3.4–10.8)

## 2024-07-04 NOTE — Telephone Encounter (Signed)
 Called patient advised of below they verbalized understanding.

## 2024-07-04 NOTE — Telephone Encounter (Signed)
 Patient would like a call back to discuss lab results

## 2024-07-04 NOTE — Telephone Encounter (Signed)
 West, Katlyn D, NP to Byron Leontine RAMAN, [place]     07/04/24  7:32 AM Result Note Please let Ms. Oelkers know that her kidney function and electrolytes are normal. Her CBC shows no evidence of infection or anemia, good results! Her potassium level is low normal, recommend she increase her dietary intake of healthy sources of potassium including bananas, squash, yogurt, white beans, sweet potatoes, leafy greens, and avocados. She can proceed with testing as planned.  Patient notified

## 2024-07-04 NOTE — Telephone Encounter (Signed)
-----   Message from Katlyn D West sent at 07/04/2024  7:32 AM EDT ----- Please let Ms. Oberman know that her kidney function and electrolytes are normal. Her CBC shows no evidence of infection or anemia, good results! Her potassium level is low normal, recommend she  increase her dietary intake of healthy sources of potassium including bananas, squash, yogurt, white beans, sweet potatoes, leafy greens, and avocados. She can proceed with testing as planned.  ----- Message ----- From: Rebecka Memos Lab Results In Sent: 07/03/2024  11:35 PM EDT To: Katlyn D West, NP

## 2024-07-12 DIAGNOSIS — E1140 Type 2 diabetes mellitus with diabetic neuropathy, unspecified: Secondary | ICD-10-CM | POA: Diagnosis not present

## 2024-07-12 DIAGNOSIS — M545 Low back pain, unspecified: Secondary | ICD-10-CM | POA: Diagnosis not present

## 2024-07-12 DIAGNOSIS — M79604 Pain in right leg: Secondary | ICD-10-CM | POA: Diagnosis not present

## 2024-07-12 DIAGNOSIS — M62830 Muscle spasm of back: Secondary | ICD-10-CM | POA: Diagnosis not present

## 2024-07-26 DIAGNOSIS — E7800 Pure hypercholesterolemia, unspecified: Secondary | ICD-10-CM | POA: Diagnosis not present

## 2024-07-26 DIAGNOSIS — I10 Essential (primary) hypertension: Secondary | ICD-10-CM | POA: Diagnosis not present

## 2024-07-26 DIAGNOSIS — Z794 Long term (current) use of insulin: Secondary | ICD-10-CM | POA: Diagnosis not present

## 2024-07-26 DIAGNOSIS — E1165 Type 2 diabetes mellitus with hyperglycemia: Secondary | ICD-10-CM | POA: Diagnosis not present

## 2024-08-12 ENCOUNTER — Encounter (HOSPITAL_COMMUNITY): Payer: Self-pay

## 2024-08-16 NOTE — Progress Notes (Deleted)
 Cardiology Office Note    Date:  08/16/2024  ID:  Sharon Munoz, DOB 15-May-1956, MRN [id number] PCP:  Munoz, Sharon W, MD  Cardiologist:  Sharon Lesches, MD  Electrophysiologist:  None   Chief Complaint: Chest pain   History of Present Illness: .    Sharon Munoz is a 68 y.o. female with visit-pertinent history of hypertension with diabetes mellitus, LVH, carpal tunnel and LVOT gradient in 2021.  Patient's cardiac MRI previously not consistent with HCM or cardiac amyloidosis.  Patient was admitted to the hospital with chest pain in 02/2020, enzymes rose minimally.  2D echo showed apical wall motion and normalities with basal hypokinesia and dynamic outflow tract gradient.  Cardiac cath was performed revealing normal coronary arteries.  Possibility of Takotsubo syndrome was raised as well as HOCM.  Echo in 02/2022 indicated LVEF 65 to 70%, no RWMA, severe asymmetric LVH of the basal septal segment, G1 DD.  She was referred to Dr. Santo, she underwent cardiac MRI that was not consistent with HCM or cardiac amyloidosis.  Echocardiogram on 03/17/2023 indicated LVEF of 55 to 60%, no RWMA, G1 DD, RV systolic function and size was normal, mild mitral valve regurgitation with no evidence of stenosis, aortic valve regurgitation was mild with no evidence of stenosis.  Patient was last seen in clinic by Dr. Santo on 04/12/2023, she was doing well at that time.  She denied any chest pain or pressure, denies shortness of breath, lower extremity edema, orthopnea or PND.  Precordial pain: At last office visit patient reported 2 episodes of chest discomfort, questionably GI in nature however given history was agreeable to pursuing coronary CTA.  Reviewed ED precautions.  Continue amlodipine  10 mg daily, aspirin  81 mg daily, atenolol  50 mg twice daily, Jardiance  25 mg daily, pravastatin  20 mg daily.  Hypertension: Initial blood pressure today Continue current antihypertensive  regimen.  Dyslipidemia: Last lipid profile on  History of stress cardiomyopathy: Patient presented with chest pain in 2021, echo and clean cath indicated possible Takotsubo syndrome.  Resolved on following echocardiograms. Today she reports precordial pain as noted above, she denies any shortness of breath, lower extremity edema, orthopnea or PND.  Basal septal hypertrophy with prior LVOT obstruction: Per notes morphologically more consistent with basal septal hypertrophy, peak gradient of 17 mmHg.  Patient evaluated by Dr. Santo previously, felt not consistent with HCM or cardiac amyloidosis.  Patient reports that she stays very well-hydrated, denies any presyncope or syncope.  Mild AI: Noted on echocardiogram in 03/2023.  She denies any shortness of breath, lower extremity edema, orthopnea or PND.  ROS: .   Today she denies shortness of breath, lower extremity edema, fatigue, palpitations, melena, hematuria, hemoptysis, diaphoresis, weakness, presyncope, syncope, orthopnea, and PND.  All other systems are reviewed and otherwise negative. Studies Reviewed: Sharon Munoz   EKG:  EKG is ordered today, personally reviewed, demonstrating      CV Studies: Cardiac studies reviewed are outlined and summarized above. Otherwise please see EMR for full report. Cardiac Studies & Procedures   ______________________________________________________________________________________________ CARDIAC CATHETERIZATION  CARDIAC CATHETERIZATION 03/08/2020  Conclusion Conclusions: 1. Tortuous coronary arteries without angiographically significant stenosis. 2. Upper normal to mildly elevated left heart and pulmonary artery pressures. 3. Normal right heart filling pressures. 4. Normal cardiac output/index. 5. Dynamic LVOT gradient of 25 mmHg at rest and 65-70 mmHg post-PVC.  Recommendations: 1. Primary prevention of coronary artery disease. 2. Hold amlodipine  and increase metoprolol  tartrate to 100 mg twice daily  to improve rate  control.  Sharon Hanson, MD [facility]  Findings Coronary Findings Diagnostic  Dominance: Right  Left Main Vessel is large.  Left Anterior Descending Vessel is large. Vessel is angiographically normal. The vessel is severely tortuous.  First Diagonal Branch Vessel is moderate in size.  Second Diagonal Branch Vessel is small in size.  Third Diagonal Branch Vessel is moderate in size.  Left Circumflex Vessel is large. Vessel is angiographically normal. The vessel is moderately tortuous.  First Obtuse Marginal Branch Vessel is large in size.  Second Obtuse Marginal Branch Vessel is small in size.  Third Obtuse Marginal Branch Vessel is small in size.  Right Coronary Artery Vessel is large. Vessel is angiographically normal.  Right Posterior Descending Artery Vessel is moderate in size. The vessel is tortuous.  Right Posterior Atrioventricular Artery Vessel is moderate in size.  Intervention  No interventions have been documented.   STRESS TESTS  ECHOCARDIOGRAM STRESS TEST 10/28/2022  Narrative EXERCISE STRESS ECHO REPORT   --------------------------------------------------------------------------------  Patient Name:   Sharon Munoz Date of Exam: 10/28/2022 Medical Rec #:  [id number]        Height:       63.0 in Accession #:    [id number]       Weight:       153.0 lb Date of Birth:  09-Jun-1956         BSA:          1.726 m Patient Age:    66 years         BP:           143/86 mmHg Patient Gender: F                HR:           65 bpm. Exam Location:  [address]  Procedure: Stress Echo  Indications:    I34.0 Nonrheumatic mitral (valve) insufficiency Exercise associated mitral regurgitation, Inducible LVOT gradient  History:        Patient has prior history of Echocardiogram examinations, most recent 03/19/2022. Dyspnea on exertion. Asymmetric septal hypertrophy. LVH.  Sonographer:    Sharon Munoz RCS Referring Phys:  [id number] Sharon Munoz I A Sharon Munoz  IMPRESSIONS   1. Baseline AV sclerosis with mild AR and trivial AR. 2. Baseline ECG with LVH no SAM but appeared to be some apposition of basal septum and hypertrophied papillary muscle. 3. Poor exercise tolerance only achieved 5.4 METS with maximum HR 106 bom Negative ETT for ischemia. 4. With stress peak LVOT gradient 12 mmHg and no change in MR. 5. This is a negative stress echocardiogram for ischemia. 6. This is a low risk study.  FINDINGS  Exam Protocol: The patient exercised on a treadmill according to a Bruce protocol.   Patient Performance: The patient exercised for 3 minutes and 44 seconds, achieving 5.4 METS. The maximum stage achieved was I of the Bruce protocol. The heart rate at peak stress was 103 bpm. The target heart rate was calculated to be 130 bpm. The percentage of maximum predicted heart rate achieved was 67.1 %. The baseline blood pressure was 143/86 mmHg. The blood pressure at peak stress was 173/94 mmHg. The blood pressure response was normal. The patient developed fatigue during the stress exam. Patient only exercised 3:45 minutes with maximum HR of 106 bpm.  EKG: Resting EKG showed normal sinus rhythm with no abnormal findings. The patient developed no abnormal EKG findings during exercise.   2D Echo Findings: Baseline regional wall motion abnormalities were  not present. There were no stress-induced wall motion abnormalities. This is a negative stress echocardiogram for ischemia.  Additional Findings: Poor exercise tolerance only achieved 5.4 METS with maximum HR 106 bom Negative ETT for ischemia. Baseline ECG with LVH no SAM but appeared to be some apposition of basal septum and hypertrophied papillary muscle. Baseline AV sclerosis with mild AR and trivial AR. With stress peak LVOT gradient 12 mmHg and no change in MR.   Maude Emmer MD Electronically signed on 10/28/2022 at 4:50:25 PM     Final    ECHOCARDIOGRAM  ECHOCARDIOGRAM COMPLETE 03/17/2023  Narrative ECHOCARDIOGRAM REPORT    Patient Name:   Sharon Munoz Date of Exam: 03/17/2023 Medical Rec #:  [id number]        Height:       63.0 in Accession #:    [id number]       Weight:       148.0 lb Date of Birth:  September 05, 1956         BSA:          1.701 m Patient Age:    66 years         BP:           159/83 mmHg Patient Gender: F                HR:           54 bpm. Exam Location:  Outpatient  Procedure: 2D Echo, Cardiac Doppler, Color Doppler, 3D Echo and Strain Analysis  Indications:    Essential hypertension [257793]  History:        Patient has prior history of Echocardiogram examinations, most recent 10/28/2022. Risk Factors:Diabetes, Dyslipidemia and Hypertension.  Sonographer:    Lauraine Pilot RDCS Referring Phys: [phone] JONATHAN J BERRY   Sonographer Comments: Global longitudinal strain was attempted. IMPRESSIONS   1. Left ventricular ejection fraction, by estimation, is 55 to 60%. Left ventricular ejection fraction by 3D volume is 56 %. The left ventricle has normal function. The left ventricle has no regional wall motion abnormalities. Left ventricular diastolic parameters are consistent with Grade I diastolic dysfunction (impaired relaxation). The average left ventricular global longitudinal strain is -20.0 %. The global longitudinal strain is normal. 2. Right ventricular systolic function is normal. The right ventricular size is normal. 3. The mitral valve is normal in structure. Mild mitral valve regurgitation. No evidence of mitral stenosis. 4. The aortic valve is tricuspid. Aortic valve regurgitation is mild. No aortic stenosis is present. Aortic regurgitation PHT measures 489 msec. 5. The inferior vena cava is normal in size with greater than 50% respiratory variability, suggesting right atrial pressure of 3 mmHg.  Comparison(s): Prior images reviewed side by side.  FINDINGS Left Ventricle: Left  ventricular ejection fraction, by estimation, is 55 to 60%. Left ventricular ejection fraction by 3D volume is 56 %. The left ventricle has normal function. The left ventricle has no regional wall motion abnormalities. The average left ventricular global longitudinal strain is -20.0 %. The global longitudinal strain is normal. The left ventricular internal cavity size was normal in size. There is no left ventricular hypertrophy. Left ventricular diastolic parameters are consistent with Grade I diastolic dysfunction (impaired relaxation).  Right Ventricle: The right ventricular size is normal. No increase in right ventricular wall thickness. Right ventricular systolic function is normal.  Left Atrium: Left atrial size was normal in size.  Right Atrium: Right atrial size was normal in size.  Pericardium: There is no evidence  of pericardial effusion.  Mitral Valve: The mitral valve is normal in structure. Mild mitral valve regurgitation. No evidence of mitral valve stenosis.  Tricuspid Valve: The tricuspid valve is normal in structure. Tricuspid valve regurgitation is mild . No evidence of tricuspid stenosis.  Aortic Valve: The aortic valve is tricuspid. Aortic valve regurgitation is mild. Aortic regurgitation PHT measures 489 msec. No aortic stenosis is present.  Pulmonic Valve: The pulmonic valve was normal in structure. Pulmonic valve regurgitation is not visualized. No evidence of pulmonic stenosis.  Aorta: The aortic root is normal in size and structure.  Venous: The inferior vena cava is normal in size with greater than 50% respiratory variability, suggesting right atrial pressure of 3 mmHg.  IAS/Shunts: No atrial level shunt detected by color flow Doppler.   LEFT VENTRICLE PLAX 2D LVIDd:         3.80 cm         Diastology LVIDs:         2.40 cm         LV e' medial:    6.53 cm/s LV PW:         1.10 cm         LV E/e' medial:  12.4 LV IVS:        1.10 cm         LV e' lateral:    9.38 cm/s LVOT diam:     1.80 cm         LV E/e' lateral: 8.6 LV SV:         72 LV SV Index:   42              2D LVOT Area:     2.54 cm        Longitudinal Strain 2D Strain GLS  -20.0 % LV Volumes (MOD)               Avg: LV vol d, MOD    83.8 ml A2C:                           3D Volume EF LV vol d, MOD    89.9 ml       LV 3D EF:    Left A4C:                                        ventricul LV vol s, MOD    27.5 ml                    ar A2C:                                        ejection LV vol s, MOD    27.6 ml                    fraction A4C:                                        by 3D LV SV MOD A2C:   56.3 ml  volume is LV SV MOD A4C:   89.9 ml                    56 %. LV SV MOD BP:    62.8 ml  3D Volume EF: 3D EF:        56 % LV EDV:       146 ml LV ESV:       65 ml LV SV:        81 ml  RIGHT VENTRICLE RV S prime:     10.80 cm/s TAPSE (M-mode): 2.3 cm  LEFT ATRIUM             Index        RIGHT ATRIUM           Index LA diam:        3.70 cm 2.17 cm/m   RA Area:     12.00 cm LA Vol (A2C):   34.9 ml 20.51 ml/m  RA Volume:   26.50 ml  15.58 ml/m LA Vol (A4C):   39.8 ml 23.39 ml/m LA Biplane Vol: 38.0 ml 22.33 ml/m AORTIC VALVE LVOT Vmax:         140.00 cm/s LVOT Vmean:        84.400 cm/s LVOT VTI:          0.283 m AI PHT:            489 msec AR Vena Contracta: 0.20 cm  AORTA Ao Root diam: 3.00 cm Ao Asc diam:  3.70 cm  MITRAL VALVE MV Area (PHT): 2.82 cm    SHUNTS MV Decel Time: 269 msec    Systemic VTI:  0.28 m MV E velocity: 80.90 cm/s  Systemic Diam: 1.80 cm MV A velocity: 94.00 cm/s MV E/A ratio:  0.86  Oneil Parchment MD Electronically signed by Oneil Parchment MD Signature Date/Time: 03/22/2023/12:47:42 PM    Final    MONITORS  LONG TERM MONITOR (3-14 DAYS) 05/28/2022  Narrative  Patient had a minimum heart rate of 31 bpm, maximum heart rate of 125 bpm, and average heart rate of 75 bpm.  Predominant underlying rhythm was  sinus rhythm.  One four beat run of NSVT with a max rate of 109 bpm at fastest.  Isolated PACs were rare (<1.0%).  Isolated PVCs were rare (<1.0%).  Mobitz Type I heart block seen (7 AM, heart rate 31 bpm).  Triggered and diary events associated with sinus rhythm.  Asymptomatic morning Mobitz Type I heart block.     CARDIAC MRI  MR CARDIAC MORPHOLOGY W WO CONTRAST 07/14/2022  Narrative CLINICAL DATA:  Clinical question of hypertrophic cardiomyopathy Study assumes HCT of 42 and BSA of 1.75 m2.  EXAM: CARDIAC MRI  TECHNIQUE: The patient was scanned on a 1.5 Tesla GE magnet. A dedicated cardiac coil was used. Functional imaging was done using Fiesta sequences. 2,3, and 4 chamber views were done to assess for RWMA's. Modified Simpson's rule using a short axis stack was used to calculate an ejection fraction on a dedicated work [employer]. The patient received 10 cc of Gadavist . After 10 minutes inversion recovery sequences were used to assess for infiltration and scar tissue.  CONTRAST:  10 cc  of Gadavist   FINDINGS: 1. Normal left ventricular size, with LVEDD 54 mm, and LVEDVi 78 mL/m2.  Mild asymmetric remodeling, with mid-intraventricular septal thickness of 12 mm, posterior wall thickness of 8 mm, and myocardial mass index of 55 g/m2.  Normal  left ventricular systolic function (LVEF =64%). There are no regional wall motion abnormalities. There is no systolic motion of the anterior valve.  Left ventricular parametric mapping notable for normal T2 and ECV.  There is no late gadolinium enhancement in the left ventricular myocardium.  2. Normal right ventricular size with RVEDVI 75 mL/m2.  Normal right ventricular thickness.  Normal right ventricular systolic function (RVEF =52%). There are no regional wall motion abnormalities or aneurysms.  3.  Normal left and right atrial size.  4. Normal size of the aortic root, ascending aorta and  pulmonary artery.  5. Valve assessment:  Aortic Valve: Tri-leaflet aortic valve. No aortic stenosis. Regurgitant fraction 1 %. No significant regurgitation.  Pulmonic Valve: Regurgitant fraction 1 %. No significant regurgitation.  Tricuspid Valve: Regurgitant fraction 7 %. Mild central regurgitation.  Mitral Valve: Regurgitant fraction 8 %.  Mild central regurgitation.  6.  Normal pericardium.  No pericardial effusion.  7. Grossly, no extracardiac findings. Recommended dedicated study if concerned for non-cardiac pathology.  IMPRESSION: Study does not meet diagnostic criteria for hypertrophic cardiomyopathy.  Stanly Leavens MD   Electronically Signed By: Stanly Leavens M.D. On: 07/14/2022 21:36   ______________________________________________________________________________________________       Current Reported Medications:.    No outpatient medications have been marked as taking for the 08/18/24 encounter (Appointment) with Chlora Mcbain D, NP.    Physical Exam:    VS:  There were no vitals taken for this visit.   Wt Readings from Last 3 Encounters:  07/03/24 147 lb (66.7 kg)  06/20/24 148 lb 9.4 oz (67.4 kg)  08/05/23 148 lb 9.4 oz (67.4 kg)    GEN: Well nourished, well developed in no acute distress NECK: No JVD; No carotid bruits CARDIAC: RRR, no murmurs, rubs, gallops RESPIRATORY:  Clear to auscultation without rales, wheezing or rhonchi  ABDOMEN: Soft, non-tender, non-distended EXTREMITIES:  No edema; No acute deformity     Asessement and Plan:.       Disposition: F/u with Bhavana Kady, NP in   Signed, Marcelene Weidemann D Fawnda Vitullo, NP

## 2024-08-18 ENCOUNTER — Ambulatory Visit: Admitting: Cardiology

## 2024-08-21 ENCOUNTER — Encounter (HOSPITAL_COMMUNITY): Payer: Self-pay

## 2024-08-23 ENCOUNTER — Encounter (HOSPITAL_BASED_OUTPATIENT_CLINIC_OR_DEPARTMENT_OTHER): Payer: Self-pay

## 2024-08-23 ENCOUNTER — Ambulatory Visit (HOSPITAL_COMMUNITY)
Admission: RE | Admit: 2024-08-23 | Discharge: 2024-08-23 | Disposition: A | Discharge status: Home / Self Care | Source: Ambulatory Visit | Attending: Cardiology | Admitting: Cardiology

## 2024-08-23 ENCOUNTER — Other Ambulatory Visit (HOSPITAL_BASED_OUTPATIENT_CLINIC_OR_DEPARTMENT_OTHER): Payer: Self-pay

## 2024-08-23 ENCOUNTER — Telehealth: Payer: Self-pay | Admitting: Cardiovascular Disease

## 2024-08-23 DIAGNOSIS — I700 Atherosclerosis of aorta: Secondary | ICD-10-CM | POA: Diagnosis not present

## 2024-08-23 DIAGNOSIS — R072 Precordial pain: Secondary | ICD-10-CM | POA: Insufficient documentation

## 2024-08-23 LAB — POCT I-STAT CREATININE: Creatinine, Ser: 0.5 mg/dL (ref 0.44–1.00)

## 2024-08-23 MED ORDER — NITROGLYCERIN 0.4 MG SL SUBL
0.8000 mg | SUBLINGUAL_TABLET | Freq: Once | SUBLINGUAL | Status: AC
  Administered 2024-08-23: 0.8 mg via SUBLINGUAL

## 2024-08-23 MED ORDER — IOHEXOL 350 MG/ML SOLN
100.0000 mL | Freq: Once | INTRAVENOUS | Status: AC | PRN
  Administered 2024-08-23: 100 mL via INTRAVENOUS

## 2024-08-23 NOTE — Telephone Encounter (Signed)
 Patient scheduled for cor CTA today at 0930. Left message. Unclear what is needed as it is almost her test time.

## 2024-08-23 NOTE — Telephone Encounter (Signed)
 Patient returned RN's call.

## 2024-08-23 NOTE — Telephone Encounter (Signed)
 Patient has questions regarding upcoming test.

## 2024-08-23 NOTE — Progress Notes (Signed)
 Opened in error

## 2024-08-23 NOTE — Progress Notes (Unsigned)
 Cardiology Office Note    Date:  08/25/2024  ID:  Haeli TERSEA AULDS, DOB 1955-12-25, MRN [id number] PCP:  Tisovec, Richard W, MD  Cardiologist:  Dorn Lesches, MD  Electrophysiologist:  None   Chief Complaint: Follow-up for precordial pain  History of Present Illness: .    Destany R Kindig is a 68 y.o. female with visit-pertinent history of hypertension with diabetes mellitus, LVH, carpal tunnel and LVOT gradient in 2021.  Patient's cardiac MRI previously not consistent with HCM or cardiac amyloidosis.  Patient was admitted to the hospital with chest pain in 02/2020, enzymes rose minimally.  2D echo showed apical wall motion and normalities with basal hypokinesia and dynamic outflow tract gradient.  Cardiac cath was performed revealing normal coronary arteries.  Possibility of Takotsubo syndrome was raised as well as HOCM.  Echo in 02/2022 indicated LVEF 65 to 70%, no RWMA, severe asymmetric LVH of the basal septal segment, G1 DD.  She was referred to Dr. Santo, she underwent cardiac MRI that was not consistent with HCM or cardiac amyloidosis.  Echocardiogram on 03/17/2023 indicated LVEF of 55 to 60%, no RWMA, G1 DD, RV systolic function and size was normal, mild mitral valve regurgitation with no evidence of stenosis, aortic valve regurgitation was mild with no evidence of stenosis.  Patient was last seen in clinic by Dr. Santo on 04/12/2023, she was doing well at that time.  She denied any chest pain or pressure, denies shortness of breath, lower extremity edema, orthopnea or PND.  Patient was seen in clinic on 07/03/2024 for follow-up.  She reported 2 episodes of chest discomfort in the prior weeks that occurred after working upstairs in a hot environment, reports she drank a cold beverage consisting in origin beverage mixed with lemonade and felt a discomfort under her left breast that traveled to her mid sternum.  She described as a gas pain however denied any feeling of acid  reflux or belching.  Reports that it is resolved after a few seconds.  She reported second episode not associate with exertion that occurred on Saturday again only lasting for a few seconds and resolved.  Coronary CTA was recommended.  Coronary CTA on 08/19/2024 indicated coronary calcium score of 0, this was 1st percentile for age and sex matched control.  No evidence of CAD.  Today she presents for follow up. She reports that she has been doing well. She denies chest pain, shortness of breath, lower extremity edema, orthopnea or PND.  She denies any dizziness, lightheadedness, presyncope or syncope.  She notes that she has had a slight headache since her test earlier this week, notes reassurance on review of coronary CTA results.  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  She notes she stays extremely busy caring for her twins and for her mother, tolerates overall well.  ROS: .   Today she denies shortness of breath, lower extremity edema, fatigue, palpitations, melena, hematuria, hemoptysis, diaphoresis, weakness, presyncope, syncope, orthopnea, and PND.  All other systems are reviewed and otherwise negative. Studies Reviewed: [name]   EKG:  EKG is not ordered today.  CV Studies: Cardiac studies reviewed are outlined and summarized above. Otherwise please see EMR for full report. Cardiac Studies & Procedures   ______________________________________________________________________________________________ CARDIAC CATHETERIZATION  CARDIAC CATHETERIZATION 03/08/2020  Conclusion Conclusions: 1. Tortuous coronary arteries without angiographically significant stenosis. 2. Upper normal to mildly elevated left heart and pulmonary artery pressures. 3. Normal right  heart filling pressures. 4. Normal cardiac output/index. 5. Dynamic LVOT gradient of 25 mmHg at rest and 65-70 mmHg post-PVC.  Recommendations: 1. Primary prevention of coronary  artery disease. 2. Hold amlodipine  and increase metoprolol  tartrate to 100 mg twice daily to improve rate control.  Lonni Hanson, MD [facility]  Findings Coronary Findings Diagnostic  Dominance: Right  Left Main Vessel is large.  Left Anterior Descending Vessel is large. Vessel is angiographically normal. The vessel is severely tortuous.  First Diagonal Branch Vessel is moderate in size.  Second Diagonal Branch Vessel is small in size.  Third Diagonal Branch Vessel is moderate in size.  Left Circumflex Vessel is large. Vessel is angiographically normal. The vessel is moderately tortuous.  First Obtuse Marginal Branch Vessel is large in size.  Second Obtuse Marginal Branch Vessel is small in size.  Third Obtuse Marginal Branch Vessel is small in size.  Right Coronary Artery Vessel is large. Vessel is angiographically normal.  Right Posterior Descending Artery Vessel is moderate in size. The vessel is tortuous.  Right Posterior Atrioventricular Artery Vessel is moderate in size.  Intervention  No interventions have been documented.   STRESS TESTS  ECHOCARDIOGRAM STRESS TEST 10/28/2022  Narrative EXERCISE STRESS ECHO REPORT   --------------------------------------------------------------------------------  Patient Name:   MANIYA DONOVAN Date of Exam: 10/28/2022 Medical Rec #:  [id number]        Height:       63.0 in Accession #:    [id number]       Weight:       153.0 lb Date of Birth:  [birth year]         BSA:          1.726 m Patient Age:    66 years         BP:           143/86 mmHg Patient Gender: F                HR:           65 bpm. Exam Location:  [address]  Procedure: Stress Echo  Indications:    I34.0 Nonrheumatic mitral (valve) insufficiency Exercise associated mitral regurgitation, Inducible LVOT gradient  History:        Patient has prior history of Echocardiogram examinations, most recent 03/19/2022. Dyspnea on exertion.  Asymmetric septal hypertrophy. LVH.  Sonographer:    Jon Hacker RCS Referring Phys: [id number] [facility] A CHANDRASEKHAR  IMPRESSIONS   1. Baseline AV sclerosis with mild AR and trivial AR. 2. Baseline ECG with LVH no SAM but appeared to be some apposition of basal septum and hypertrophied papillary muscle. 3. Poor exercise tolerance only achieved 5.4 METS with maximum HR 106 bom Negative ETT for ischemia. 4. With stress peak LVOT gradient 12 mmHg and no change in MR. 5. This is a negative stress echocardiogram for ischemia. 6. This is a low risk study.  FINDINGS  Exam Protocol: The patient exercised on a treadmill according to a Bruce protocol.   Patient Performance: The patient exercised for 3 minutes and 44 seconds, achieving 5.4 METS. The maximum stage achieved was I of the Bruce protocol. The heart rate at peak stress was 103 bpm. The target heart rate was calculated to be 130 bpm. The percentage of maximum predicted heart rate achieved was 67.1 %. The baseline blood pressure was 143/86 mmHg. The blood pressure at peak stress was 173/94 mmHg. The blood pressure response was normal. The patient developed fatigue during the  stress exam. Patient only exercised 3:45 minutes with maximum HR of 106 bpm.  EKG: Resting EKG showed normal sinus rhythm with no abnormal findings. The patient developed no abnormal EKG findings during exercise.   2D Echo Findings: Baseline regional wall motion abnormalities were not present. There were no stress-induced wall motion abnormalities. This is a negative stress echocardiogram for ischemia.  Additional Findings: Poor exercise tolerance only achieved 5.4 METS with maximum HR 106 bom Negative ETT for ischemia. Baseline ECG with LVH no SAM but appeared to be some apposition of basal septum and hypertrophied papillary muscle. Baseline AV sclerosis with mild AR and trivial AR. With stress peak LVOT gradient 12 mmHg and no change in MR.   Maude Emmer  MD Electronically signed on 10/28/2022 at 4:50:25 PM     Final   ECHOCARDIOGRAM  ECHOCARDIOGRAM COMPLETE 03/17/2023  Narrative ECHOCARDIOGRAM REPORT    Patient Name:   CAITLYN BUCHANAN Date of Exam: 03/17/2023 Medical Rec #:  [id number]        Height:       63.0 in Accession #:    [id number]       Weight:       148.0 lb Date of Birth:  17-Apr-1956         BSA:          1.701 m Patient Age:    66 years         BP:           159/83 mmHg Patient Gender: F                HR:           54 bpm. Exam Location:  Outpatient  Procedure: 2D Echo, Cardiac Doppler, Color Doppler, 3D Echo and Strain Analysis  Indications:    Essential hypertension [257793]  History:        Patient has prior history of Echocardiogram examinations, most recent 10/28/2022. Risk Factors:Diabetes, Dyslipidemia and Hypertension.  Sonographer:    Lauraine Pilot RDCS Referring Phys: [phone] JONATHAN J BERRY   Sonographer Comments: Global longitudinal strain was attempted. IMPRESSIONS   1. Left ventricular ejection fraction, by estimation, is 55 to 60%. Left ventricular ejection fraction by 3D volume is 56 %. The left ventricle has normal function. The left ventricle has no regional wall motion abnormalities. Left ventricular diastolic parameters are consistent with Grade I diastolic dysfunction (impaired relaxation). The average left ventricular global longitudinal strain is -20.0 %. The global longitudinal strain is normal. 2. Right ventricular systolic function is normal. The right ventricular size is normal. 3. The mitral valve is normal in structure. Mild mitral valve regurgitation. No evidence of mitral stenosis. 4. The aortic valve is tricuspid. Aortic valve regurgitation is mild. No aortic stenosis is present. Aortic regurgitation PHT measures 489 msec. 5. The inferior vena cava is normal in size with greater than 50% respiratory variability, suggesting right atrial pressure of 3 mmHg.  Comparison(s): Prior  images reviewed side by side.  FINDINGS Left Ventricle: Left ventricular ejection fraction, by estimation, is 55 to 60%. Left ventricular ejection fraction by 3D volume is 56 %. The left ventricle has normal function. The left ventricle has no regional wall motion abnormalities. The average left ventricular global longitudinal strain is -20.0 %. The global longitudinal strain is normal. The left ventricular internal cavity size was normal in size. There is no left ventricular hypertrophy. Left ventricular diastolic parameters are consistent with Grade I diastolic dysfunction (impaired relaxation).  Right Ventricle:  The right ventricular size is normal. No increase in right ventricular wall thickness. Right ventricular systolic function is normal.  Left Atrium: Left atrial size was normal in size.  Right Atrium: Right atrial size was normal in size.  Pericardium: There is no evidence of pericardial effusion.  Mitral Valve: The mitral valve is normal in structure. Mild mitral valve regurgitation. No evidence of mitral valve stenosis.  Tricuspid Valve: The tricuspid valve is normal in structure. Tricuspid valve regurgitation is mild . No evidence of tricuspid stenosis.  Aortic Valve: The aortic valve is tricuspid. Aortic valve regurgitation is mild. Aortic regurgitation PHT measures 489 msec. No aortic stenosis is present.  Pulmonic Valve: The pulmonic valve was normal in structure. Pulmonic valve regurgitation is not visualized. No evidence of pulmonic stenosis.  Aorta: The aortic root is normal in size and structure.  Venous: The inferior vena cava is normal in size with greater than 50% respiratory variability, suggesting right atrial pressure of 3 mmHg.  IAS/Shunts: No atrial level shunt detected by color flow Doppler.   LEFT VENTRICLE PLAX 2D LVIDd:         3.80 cm         Diastology LVIDs:         2.40 cm         LV e' medial:    6.53 cm/s LV PW:         1.10 cm         LV E/e'  medial:  12.4 LV IVS:        1.10 cm         LV e' lateral:   9.38 cm/s LVOT diam:     1.80 cm         LV E/e' lateral: 8.6 LV SV:         72 LV SV Index:   42              2D LVOT Area:     2.54 cm        Longitudinal Strain 2D Strain GLS  -20.0 % LV Volumes (MOD)               Avg: LV vol d, MOD    83.8 ml A2C:                           3D Volume EF LV vol d, MOD    89.9 ml       LV 3D EF:    Left A4C:                                        ventricul LV vol s, MOD    27.5 ml                    ar A2C:                                        ejection LV vol s, MOD    27.6 ml                    fraction A4C:  by 3D LV SV MOD A2C:   56.3 ml                    volume is LV SV MOD A4C:   89.9 ml                    56 %. LV SV MOD BP:    62.8 ml  3D Volume EF: 3D EF:        56 % LV EDV:       146 ml LV ESV:       65 ml LV SV:        81 ml  RIGHT VENTRICLE RV S prime:     10.80 cm/s TAPSE (M-mode): 2.3 cm  LEFT ATRIUM             Index        RIGHT ATRIUM           Index LA diam:        3.70 cm 2.17 cm/m   RA Area:     12.00 cm LA Vol (A2C):   34.9 ml 20.51 ml/m  RA Volume:   26.50 ml  15.58 ml/m LA Vol (A4C):   39.8 ml 23.39 ml/m LA Biplane Vol: 38.0 ml 22.33 ml/m AORTIC VALVE LVOT Vmax:         140.00 cm/s LVOT Vmean:        84.400 cm/s LVOT VTI:          0.283 m AI PHT:            489 msec AR Vena Contracta: 0.20 cm  AORTA Ao Root diam: 3.00 cm Ao Asc diam:  3.70 cm  MITRAL VALVE MV Area (PHT): 2.82 cm    SHUNTS MV Decel Time: 269 msec    Systemic VTI:  0.28 m MV E velocity: 80.90 cm/s  Systemic Diam: 1.80 cm MV A velocity: 94.00 cm/s MV E/A ratio:  0.86  Oneil Parchment MD Electronically signed by Oneil Parchment MD Signature Date/Time: 03/22/2023/12:47:42 PM    Final    MONITORS  LONG TERM MONITOR (3-14 DAYS) 05/28/2022  Narrative  Patient had a minimum heart rate of 31 bpm, maximum heart rate of 125 bpm, and  average heart rate of 75 bpm.  Predominant underlying rhythm was sinus rhythm.  One four beat run of NSVT with a max rate of 109 bpm at fastest.  Isolated PACs were rare (<1.0%).  Isolated PVCs were rare (<1.0%).  Mobitz Type I heart block seen (7 AM, heart rate 31 bpm).  Triggered and diary events associated with sinus rhythm.  Asymptomatic morning Mobitz Type I heart block.   CT SCANS  CT CORONARY MORPH W/CTA COR W/SCORE 08/23/2024  Narrative HISTORY: precordial pain  EXAM: Cardiac/Coronary  CT  PROTOCOL: A non-contrast, gated CT scan was obtained with axial slices of 2.5 mm through the heart for calcium scoring. Calcium scoring was performed using the Agatston method. A 120 kV prospective, gated, contrast cardiac CT scan was obtained. Gantry rotation speed was 230 msec and collimation was 0.63 mm. Two sublingual nitroglycerin  tablets (0.8 mg) were given. The 3D data set was reconstructed with motion correction for the best systolic or diastolic phase. Images were analyzed on a dedicated workstation using MPR, MIP, and VRT modes. The patient received 95 cc of contrast.  FINDINGS: Image quality: Excellent.  Artifact: Limited  Coronary calcium score is 0, which places the patient in the 1st percentile  for age and sex matched control.  Coronary arteries: Normal coronary origins.  Right dominance.  Left Main Coronary Artery: Normal caliber vessel, originates from the left coronary cusp, bifurcates to form a left anterior descending artery (LAD) and a left circumflex artery (LCX). Left main is patent.  Left Anterior Descending Artery: Normal caliber vessel, wraps the apex, gives off 4 diagonal branches. LAD is patent.  First Diagonal branch: Patent  Second Diagonal branch: Patent  Third Diagonal branch: Patent  Fourth Diagonal branch: Patent  Left Circumflex Artery: Normal caliber vessel, non-dominant, travels within the atrioventricular groove, gives off  4 obtuse marginal branches. LCx is patent.  First Obtuse Marginal branch: Small caliber, patent  Second Obtuse Marginal branch: Normal caliber, large size, patent  Third Obtuse Marginal branch: Small caliber, patent  Fourth Obtuse Marginal branch: Normal caliber, patent  Right Coronary Artery: Dominant vessel, originates from the right coronary cusp, terminates as a PDA and right posterolateral branch. RCA is patent.  Aorta: Normal size, 35 mm at the mid ascending aorta (level of the PA bifurcation) measured double oblique. Aortic atherosclerosis.  Aortic Valve: Native valve, trileaflet aortic valve, no significant calcification.  Mitral valve: Native valve, no significant mitral annular calcification.  Other findings:  Normal pulmonary vein drainage into the left atrium.  Normal left atrial appendage without thrombus.  Normal size of the pulmonary artery.  Please see separate report from [facility] for non-cardiac findings.  IMPRESSION: 1. Coronary calcium score of 0. This was 1st percentile for age and sex matched control.  2. Normal coronary origins with right dominance.  3. CAD-RADS 0.  No evidence of CAD.  4. Aortic atherosclerosis.  RECOMMENDATION: Consider non-atherosclerotic causes of chest pain.   Electronically Signed By: Madonna Large On: 08/23/2024 19:01   CARDIAC MRI  MR CARDIAC MORPHOLOGY W WO CONTRAST 07/14/2022  Narrative CLINICAL DATA:  Clinical question of hypertrophic cardiomyopathy Study assumes HCT of 42 and BSA of 1.75 m2.  EXAM: CARDIAC MRI  TECHNIQUE: The patient was scanned on a 1.5 Tesla GE magnet. A dedicated cardiac coil was used. Functional imaging was done using Fiesta sequences. 2,3, and 4 chamber views were done to assess for RWMA's. Modified Simpson's rule using a short axis stack was used to calculate an ejection fraction on a dedicated work [employer]. The patient received 10 cc of  Gadavist . After 10 minutes inversion recovery sequences were used to assess for infiltration and scar tissue.  CONTRAST:  10 cc  of Gadavist   FINDINGS: 1. Normal left ventricular size, with LVEDD 54 mm, and LVEDVi 78 mL/m2.  Mild asymmetric remodeling, with mid-intraventricular septal thickness of 12 mm, posterior wall thickness of 8 mm, and myocardial mass index of 55 g/m2.  Normal left ventricular systolic function (LVEF =64%). There are no regional wall motion abnormalities. There is no systolic motion of the anterior valve.  Left ventricular parametric mapping notable for normal T2 and ECV.  There is no late gadolinium enhancement in the left ventricular myocardium.  2. Normal right ventricular size with RVEDVI 75 mL/m2.  Normal right ventricular thickness.  Normal right ventricular systolic function (RVEF =52%). There are no regional wall motion abnormalities or aneurysms.  3.  Normal left and right atrial size.  4. Normal size of the aortic root, ascending aorta and pulmonary artery.  5. Valve assessment:  Aortic Valve: Tri-leaflet aortic valve. No aortic stenosis. Regurgitant fraction 1 %. No significant regurgitation.  Pulmonic Valve: Regurgitant fraction 1 %. No significant regurgitation.  Tricuspid Valve: Regurgitant fraction 7 %. Mild central regurgitation.  Mitral Valve: Regurgitant fraction 8 %.  Mild central regurgitation.  6.  Normal pericardium.  No pericardial effusion.  7. Grossly, no extracardiac findings. Recommended dedicated study if concerned for non-cardiac pathology.  IMPRESSION: Study does not meet diagnostic criteria for hypertrophic cardiomyopathy.  Stanly Leavens MD   Electronically Signed By: Stanly Leavens M.D. On: 07/14/2022 21:36   ______________________________________________________________________________________________       Current Reported Medications:.    Current Meds  Medication Sig    KLOR-CON  M10 10 MEQ tablet Take 10 mEq by mouth 3 (three) times a week. For 30 days (Patient taking differently: Take 10 mEq by mouth daily. For 30 days)   Magnesium  250 MG TABS daily. (Patient taking differently: Take 1 tablet by mouth every other day.)   Multiple Vitamins-Minerals (HAIR SKIN NAILS PO) Take 1 capsule by mouth 3 (three) times a week.   Omega-3 Fatty Acids (FISH OIL) 1000 MG CAPS Take 1,000 mg by mouth 2 (two) times a week. (Patient taking differently: Take 1,000 mg by mouth every other day.)   Physical Exam:    VS:  BP (!) 142/78 (BP Location: Right Arm)   Pulse 70   Ht 5' 3 (1.6 m)   Wt 151 lb 9.6 oz (68.8 kg)   SpO2 95%   BMI 26.85 kg/m    Wt Readings from Last 3 Encounters:  08/25/24 151 lb 9.6 oz (68.8 kg)  07/03/24 147 lb (66.7 kg)  06/20/24 148 lb 9.4 oz (67.4 kg)    GEN: Well nourished, well developed in no acute distress NECK: No JVD; No carotid bruits CARDIAC: RRR, no murmurs, rubs, gallops RESPIRATORY:  Clear to auscultation without rales, wheezing or rhonchi  ABDOMEN: Soft, non-tender, non-distended EXTREMITIES:  No edema; No acute deformity     Asessement and Plan:.    Precordial pain: At last office visit patient reported 2 episodes of chest discomfort, questionably GI in nature however given history was agreeable to pursuing coronary CTA. Coronary CTA on 08/19/2024 indicated coronary calcium score of 0, this was 1st percentile for age and sex matched control.  No evidence of CAD. Stable with no anginal symptoms. No indication for ischemic evaluation.  Heart healthy diet and regular cardiovascular exercise encouraged.  Reviewed ED precautions.  Continue amlodipine  10 mg daily, aspirin  81 mg daily, atenolol  50 mg twice daily, Jardiance  25 mg daily, pravastatin  20 mg daily.  Hypertension: Initial blood pressure today 136/82, on recheck 142/78.  Patient notes that she has not yet taken her amlodipine  or atenolol  this morning, she will take when she returns  home and continue to monitor her blood pressure at home. Continue current antihypertensive regimen.  Dyslipidemia: Last lipid profile on 04/03/2024 indicated total cholesterol 141, triglyceride 67, HDL 61 and LDL 67.  Continue pravastatin  20 mg daily.  History of stress cardiomyopathy: Patient presented with chest pain in 2021, echo and clean cath indicated possible Takotsubo syndrome.  Resolved on following echocardiograms.  Today she denies any further chest discomfort, denies shortness of breath, lower extremity Dema orthopnea PND.  Patient will notify the office of any weight gain or increased lower extremity edema.  Basal septal hypertrophy with prior LVOT obstruction: Per notes morphologically more consistent with basal septal hypertrophy, peak gradient of 17 mmHg.  Patient evaluated by Dr. Leavens previously, felt not consistent with HCM or cardiac amyloidosis.  Patient denies any dizziness, lightheadedness, presyncope or syncope.  She reports that she stays very  well-hydrated.  Mild AI: Noted on echocardiogram in 03/2023.  Patient denies any further chest discomfort, shortness of breath, lower extreme edema, thawing or PND.   Disposition: F/u with Dr. Court or Kylar Leonhardt, NP in one year or sooner if needed.   Signed, Maimuna Leaman D Olawale Marney, NP

## 2024-08-23 NOTE — Telephone Encounter (Signed)
 Pt called to say that she is scheduled for a CT at 0930, and did not receive a call to go over instructions before the appt. Informed her that she is still scheduled for 0930, she needs to check in on the 1st floor when she gets here. She verbalized understanding.

## 2024-08-25 ENCOUNTER — Encounter: Payer: Self-pay | Admitting: Cardiology

## 2024-08-25 ENCOUNTER — Ambulatory Visit: Attending: Cardiology | Admitting: Cardiology

## 2024-08-25 VITALS — BP 142/78 | HR 70 | Ht 63.0 in | Wt 151.6 lb

## 2024-08-25 DIAGNOSIS — I517 Cardiomegaly: Secondary | ICD-10-CM | POA: Diagnosis not present

## 2024-08-25 DIAGNOSIS — I10 Essential (primary) hypertension: Secondary | ICD-10-CM

## 2024-08-25 DIAGNOSIS — I429 Cardiomyopathy, unspecified: Secondary | ICD-10-CM

## 2024-08-25 DIAGNOSIS — R072 Precordial pain: Secondary | ICD-10-CM

## 2024-08-25 DIAGNOSIS — E785 Hyperlipidemia, unspecified: Secondary | ICD-10-CM | POA: Diagnosis not present

## 2024-08-25 DIAGNOSIS — I340 Nonrheumatic mitral (valve) insufficiency: Secondary | ICD-10-CM

## 2024-08-25 NOTE — Patient Instructions (Signed)
 Medication Instructions:  Your physician recommends that you continue on your current medications as directed. Please refer to the Current Medication list given to you today. *If you need a refill on your cardiac medications before your next appointment, please call your pharmacy*  Lab Work: None ordered If you have labs (blood work) drawn today and your tests are completely normal, you will receive your results only by: MyChart Message (if you have MyChart) OR A paper copy in the mail If you have any lab test that is abnormal or we need to change your treatment, we will call you to review the results.  Testing/Procedures: None ordered  Follow-Up: At [facility], you and your health needs are our priority.  As part of our continuing mission to provide you with exceptional heart care, our providers are all part of one team.  This team includes your primary Cardiologist (physician) and Advanced Practice Providers or APPs (Physician Assistants and Nurse Practitioners) who all work together to provide you with the care you need, when you need it.  Your next appointment:   12 month(s)  Provider:   Dorn Lesches, MD  or Katlyn West, NP  We recommend signing up for the patient portal called MyChart.  Sign up information is provided on this After Visit Summary.  MyChart is used to connect with patients for Virtual Visits (Telemedicine).  Patients are able to view lab/test results, encounter notes, upcoming appointments, etc.  Non-urgent messages can be sent to your provider as well.   To learn more about what you can do with MyChart, go to [email or web address].   Other Instructions

## 2024-09-05 ENCOUNTER — Telehealth: Payer: Self-pay | Admitting: Cardiovascular Disease

## 2024-09-05 NOTE — Telephone Encounter (Signed)
 Called and spoke with patient. Patient states she received a voicemail to call back about results around 9 AM today. Upon chart review patient was attempted to be contacted by Delon Medley RMA on 09/05/24 at 9:11 AM with CT results. Reviewed CT result note with patient:   Sharon D West, NP 09/04/2024 12:50 PM EDT     Please let [name] know that her CT over read by radiology shows that she has a small hiatal hernia with some mild wall thickening of the esophagus which is common with reflux or esophagitis.  Recommend that she follow-up with her PCP or gastroenterologist if she is having any increases in acid reflux or abdominal discomfort.   Patient requested that result note be sent to her primary care provider so they will know what she is talking about when she calls them. Patient states her PCP is Sharon Munoz. Advised patient that result note will be forwarded to Sharon Munoz and her GI provider Sharon Munoz so that she can follow up with him. Patient verbalized understanding and agreeable to plan.

## 2024-09-05 NOTE — Telephone Encounter (Signed)
 Patient is returning call. Please advise?

## 2024-09-08 DIAGNOSIS — M25511 Pain in right shoulder: Secondary | ICD-10-CM | POA: Diagnosis not present

## 2024-09-08 DIAGNOSIS — M79641 Pain in right hand: Secondary | ICD-10-CM | POA: Diagnosis not present

## 2024-09-14 DIAGNOSIS — M25511 Pain in right shoulder: Secondary | ICD-10-CM | POA: Diagnosis not present

## 2024-09-14 DIAGNOSIS — G5601 Carpal tunnel syndrome, right upper limb: Secondary | ICD-10-CM | POA: Diagnosis not present

## 2024-09-20 DIAGNOSIS — S62366A Nondisplaced fracture of neck of fifth metacarpal bone, right hand, initial encounter for closed fracture: Secondary | ICD-10-CM | POA: Diagnosis not present

## 2024-09-20 DIAGNOSIS — M79641 Pain in right hand: Secondary | ICD-10-CM | POA: Diagnosis not present

## 2024-10-09 ENCOUNTER — Emergency Department (HOSPITAL_BASED_OUTPATIENT_CLINIC_OR_DEPARTMENT_OTHER)
Admission: EM | Admit: 2024-10-09 | Discharge: 2024-10-10 | Disposition: A | Discharge status: Home / Self Care | Attending: Emergency Medicine | Admitting: Emergency Medicine

## 2024-10-09 ENCOUNTER — Other Ambulatory Visit: Payer: Self-pay

## 2024-10-09 ENCOUNTER — Emergency Department (HOSPITAL_BASED_OUTPATIENT_CLINIC_OR_DEPARTMENT_OTHER)

## 2024-10-09 ENCOUNTER — Encounter (HOSPITAL_BASED_OUTPATIENT_CLINIC_OR_DEPARTMENT_OTHER): Payer: Self-pay

## 2024-10-09 DIAGNOSIS — Z7982 Long term (current) use of aspirin: Secondary | ICD-10-CM | POA: Diagnosis not present

## 2024-10-09 DIAGNOSIS — N281 Cyst of kidney, acquired: Secondary | ICD-10-CM | POA: Diagnosis not present

## 2024-10-09 DIAGNOSIS — I10 Essential (primary) hypertension: Secondary | ICD-10-CM | POA: Insufficient documentation

## 2024-10-09 DIAGNOSIS — Z79899 Other long term (current) drug therapy: Secondary | ICD-10-CM | POA: Diagnosis not present

## 2024-10-09 DIAGNOSIS — Z7984 Long term (current) use of oral hypoglycemic drugs: Secondary | ICD-10-CM | POA: Diagnosis not present

## 2024-10-09 DIAGNOSIS — R1031 Right lower quadrant pain: Secondary | ICD-10-CM | POA: Insufficient documentation

## 2024-10-09 DIAGNOSIS — E119 Type 2 diabetes mellitus without complications: Secondary | ICD-10-CM | POA: Diagnosis not present

## 2024-10-09 DIAGNOSIS — D259 Leiomyoma of uterus, unspecified: Secondary | ICD-10-CM | POA: Diagnosis not present

## 2024-10-09 DIAGNOSIS — D72829 Elevated white blood cell count, unspecified: Secondary | ICD-10-CM | POA: Insufficient documentation

## 2024-10-09 LAB — CBC WITH DIFFERENTIAL/PLATELET
Abs Immature Granulocytes: 0.03 K/uL (ref 0.00–0.07)
Basophils Absolute: 0.1 K/uL (ref 0.0–0.1)
Basophils Relative: 1 %
Eosinophils Absolute: 0.4 K/uL (ref 0.0–0.5)
Eosinophils Relative: 4 %
HCT: 44 % (ref 36.0–46.0)
Hemoglobin: 14.3 g/dL (ref 12.0–15.0)
Immature Granulocytes: 0 %
Lymphocytes Relative: 18 %
Lymphs Abs: 2 K/uL (ref 0.7–4.0)
MCH: 29.6 pg (ref 26.0–34.0)
MCHC: 32.5 g/dL (ref 30.0–36.0)
MCV: 91.1 fL (ref 80.0–100.0)
Monocytes Absolute: 1.2 K/uL — ABNORMAL HIGH (ref 0.1–1.0)
Monocytes Relative: 11 %
Neutro Abs: 7.3 K/uL (ref 1.7–7.7)
Neutrophils Relative %: 66 %
Platelets: 240 K/uL (ref 150–400)
RBC: 4.83 MIL/uL (ref 3.87–5.11)
RDW: 14.4 % (ref 11.5–15.5)
WBC: 10.9 K/uL — ABNORMAL HIGH (ref 4.0–10.5)
nRBC: 0 % (ref 0.0–0.2)

## 2024-10-09 LAB — URINALYSIS, ROUTINE W REFLEX MICROSCOPIC
Bilirubin Urine: NEGATIVE
Glucose, UA: >1000 mg/dL — AB
Hgb urine dipstick: NEGATIVE
Ketones, ur: NEGATIVE mg/dL
Nitrite: NEGATIVE
Specific Gravity, Urine: 1.029 (ref 1.005–1.030)
pH: 7 (ref 5.0–8.0)

## 2024-10-09 LAB — COMPREHENSIVE METABOLIC PANEL WITH GFR
ALT: 19 U/L (ref 0–44)
AST: 27 U/L (ref 15–41)
Albumin: 4.4 g/dL (ref 3.5–5.0)
Alkaline Phosphatase: 83 U/L (ref 38–126)
Anion gap: 12 (ref 5–15)
BUN: 12 mg/dL (ref 8–23)
CO2: 25 mmol/L (ref 22–32)
Calcium: 9.9 mg/dL (ref 8.9–10.3)
Chloride: 106 mmol/L (ref 98–111)
Creatinine, Ser: 0.44 mg/dL (ref 0.44–1.00)
GFR, Estimated: >60 mL/min (ref >60)
Glucose, Bld: 110 mg/dL — ABNORMAL HIGH (ref 70–99)
Potassium: 3.5 mmol/L (ref 3.5–5.1)
Sodium: 143 mmol/L (ref 135–145)
Total Bilirubin: 1.5 mg/dL — ABNORMAL HIGH (ref 0.0–1.2)
Total Protein: 7.9 g/dL (ref 6.5–8.1)

## 2024-10-09 LAB — LIPASE, BLOOD: Lipase: 23 U/L (ref 11–51)

## 2024-10-09 LAB — CBG MONITORING, ED: Glucose-Capillary: 107 mg/dL — ABNORMAL HIGH (ref 70–99)

## 2024-10-09 MED ORDER — LACTATED RINGERS IV BOLUS
1000.0000 mL | Freq: Once | INTRAVENOUS | Status: AC
  Administered 2024-10-09: 1000 mL via INTRAVENOUS

## 2024-10-09 MED ORDER — ONDANSETRON HCL 4 MG/2ML IJ SOLN
4.0000 mg | Freq: Once | INTRAMUSCULAR | Status: AC
  Administered 2024-10-09: 4 mg via INTRAVENOUS
  Filled 2024-10-09: qty 2

## 2024-10-09 MED ORDER — HYDROMORPHONE HCL 1 MG/ML IJ SOLN
0.5000 mg | Freq: Once | INTRAMUSCULAR | Status: AC
  Administered 2024-10-09: 0.5 mg via INTRAVENOUS
  Filled 2024-10-09: qty 1

## 2024-10-09 MED ORDER — IOHEXOL 300 MG/ML  SOLN
100.0000 mL | Freq: Once | INTRAMUSCULAR | Status: AC | PRN
  Administered 2024-10-09: 100 mL via INTRAVENOUS

## 2024-10-09 NOTE — ED Notes (Signed)
 Patient educated about not driving or performing other critical tasks (such as operating heavy machinery, caring for infant/toddler/child) due to sedative nature of narcotic medications received while in the ED.  Pt/caregiver verbalized understanding, reports her granddaughter is coming pick her up

## 2024-10-09 NOTE — ED Provider Notes (Signed)
 [name] [facility] DEPARTMENT AT [facility] Provider Note   CSN: [id number] Arrival date & time: 10/09/24  2137     Patient presents with: Abdominal Pain   Sharon Munoz is a 68 y.o. female.  {Add pertinent medical, surgical, social history, OB history to HPI:346} 68 year old female history of insulin -dependent diabetes and hypertension presents to the emergency department right lower quadrant abdominal pain.  Patient reports that yesterday he started having pain in her right lower quadrant.  Initially was a dull nagging pain.  Today became sharp 10/10 pain.  Worsened when she moves around.  Does not radiate.  No nausea or vomiting.  No fevers or chills.  No diarrhea.  No dysuria or frequency.  No vaginal discharge.  Also reports that she felt lightheaded since yesterday as well.       Prior to Admission medications   Medication Sig Start Date End Date Taking? Authorizing Provider  acetaminophen  (TYLENOL ) 500 MG tablet Take 1,000 mg by mouth every 6 (six) hours as needed for mild pain.    [provider]  albuterol  (VENTOLIN  HFA) 108 (90 Base) MCG/ACT inhaler Inhale 2 puffs into the lungs every 4 (four) hours as needed for wheezing or shortness of breath. 01/20/24   Vonna Sharlet POUR, MD  amLODipine  (NORVASC ) 10 MG tablet Take 10 mg by mouth daily. 01/17/24   [provider]  Ascorbic Acid (VITAMIN C) 500 MG CHEW daily at 6 (six) AM.    [provider]  aspirin  EC 81 MG tablet Take 81 mg by mouth daily.    [provider]  atenolol  (TENORMIN ) 50 MG tablet Take 1 tablet (50 mg total) by mouth 2 (two) times daily. 10/21/21   Emelia Josefa HERO, NP  Baclofen  5 MG TABS Take 1 tablet (5 mg total) by mouth 2 (two) times daily as needed. 06/20/24   Rising, Asberry, PA-C  BD PEN NEEDLE NANO 2ND GEN 32G X 4 MM MISC USE WITH TRESIBA  DAILY 90 12/18/23   [provider]  BISACODYL 5 MG EC tablet Take by mouth as directed. 03/27/24   [provider]  Calcium Carb-Cholecalciferol (CALCIUM 500 + D3) 500-15 MG-MCG TABS 2 (two) times a week.    [provider]  cetirizine (ZYRTEC) 10 MG chewable tablet Chew 10 mg by mouth as needed for allergies. 03/06/20   [provider]  Cholecalciferol (VITAMIN D3 PO) Take 1 capsule by mouth 4 (four) times a week. [employer] Brand    [provider]  clindamycin (CLEOCIN T) 1 % external solution Apply 1 Application topically 2 (two) times daily. 01/13/24   [provider]  empagliflozin  (JARDIANCE ) 25 MG TABS tablet Take 1 tablet (25 mg total) by mouth daily. 11/20/22   Emelia Josefa HERO, NP  escitalopram  (LEXAPRO ) 20 MG tablet Take 1 tablet (20 mg total) by mouth daily. 03/09/20 07/03/24  Samtani, Jai-Gurmukh, MD  fluticasone  (FLONASE ) 50 MCG/ACT nasal spray Place 1 spray into both nostrils daily. 12/27/22   Hazen Darryle BRAVO, FNP  gabapentin  (NEURONTIN ) 100 MG capsule Take 3 capsules (300 mg total) by mouth 3 (three) times daily. 02/12/16   Onita Duos, MD  gatifloxacin (ZYMAXID) 0.5 % SOLN Place into both eyes daily at 6 (six) AM. 04/08/23   [provider]  hydrochlorothiazide  (MICROZIDE ) 12.5 MG capsule Take 12.5 mg by mouth daily. 04/06/23   [provider]  ibandronate (BONIVA) 150 MG tablet every 30 (thirty) days.    [provider]  ibuprofen  (ADVIL )  400 MG tablet Take 1 tablet (400 mg total) by mouth every 6 (six) hours as needed. 06/20/24   Rising, Asberry, PA-C  KLOR-CON  M10 10 MEQ tablet Take 10 mEq by mouth 3 (three) times a week. For 30 days Patient taking differently: Take 10 mEq by mouth daily. For 30 days 02/11/22   [provider]  levocetirizine (XYZAL ) 5 MG tablet Take 5 mg by mouth as needed for allergies.    [provider]  Magnesium  250 MG TABS daily. Patient taking differently: Take 1 tablet by mouth every other day.    [provider]  Multiple Vitamins-Minerals (HAIR SKIN NAILS PO) Take 1 capsule  by mouth 3 (three) times a week.    [provider]  Omega-3 Fatty Acids (FISH OIL) 1000 MG CAPS Take 1,000 mg by mouth 2 (two) times a week. Patient taking differently: Take 1,000 mg by mouth every other day.    [provider]  pravastatin  (PRAVACHOL ) 20 MG tablet TAKE 1 TABLET BY MOUTH EVERY DAY 12/14/22   Emelia Josefa HERO, NP  TRESIBA  FLEXTOUCH 100 UNIT/ML FlexTouch Pen Inject 36 Units into the skin daily. 06/20/20   [provider]    Allergies: Codeine, Ace inhibitors, Citrus, Lisinopril, Other, Percocet [oxycodone-acetaminophen ], Propoxyphene, and Januvia [sitagliptin]    Review of Systems  Updated Vital Signs BP (!) 161/79   Pulse 79   Temp 98.2 F (36.8 C) (Oral)   Resp 16   Ht 5' 3 (1.6 m)   Wt 67.6 kg   SpO2 99%   BMI 26.39 kg/m   Physical Exam Vitals and nursing note reviewed.  Constitutional:      General: She is not in acute distress.    Appearance: She is well-developed.  HENT:     Head: Normocephalic and atraumatic.     Right Ear: External ear normal.     Left Ear: External ear normal.     Nose: Nose normal.  Eyes:     Extraocular Movements: Extraocular movements intact.     Conjunctiva/sclera: Conjunctivae normal.     Pupils: Pupils are equal, round, and reactive to light.  Pulmonary:     Effort: Pulmonary effort is normal. No respiratory distress.  Abdominal:     General: Abdomen is flat. There is no distension.     Palpations: Abdomen is soft. There is no mass.     Tenderness: There is abdominal tenderness (Right lower quadrant). There is no guarding.     Comments: Positive psoas sign  Musculoskeletal:     Cervical back: Normal range of motion and neck supple.  Skin:    General: Skin is warm and dry.     Comments: Bruising on the abdomen from her insulin  injection  Neurological:     Mental Status: She is alert and oriented to person, place, and time. Mental status is at baseline.  Psychiatric:        Mood and Affect:  Mood normal.     (all labs ordered are listed, but only abnormal results are displayed) Labs Reviewed  CBC WITH DIFFERENTIAL/PLATELET - Abnormal; Notable for the following components:      Result Value   WBC 10.9 (*)    Monocytes Absolute 1.2 (*)    All other components within normal limits  COMPREHENSIVE METABOLIC PANEL WITH GFR - Abnormal; Notable for the following components:   Glucose, Bld 110 (*)    Total Bilirubin 1.5 (*)    All other components within normal limits  URINALYSIS,  ROUTINE W REFLEX MICROSCOPIC - Abnormal; Notable for the following components:   Glucose, UA >1,000 (*)    Protein, ur TRACE (*)    Leukocytes,Ua SMALL (*)    Bacteria, UA RARE (*)    All other components within normal limits  CBG MONITORING, ED - Abnormal; Notable for the following components:   Glucose-Capillary 107 (*)    All other components within normal limits  LIPASE, BLOOD    EKG: None  Radiology: No results found.  {Document cardiac monitor, telemetry assessment procedure when appropriate:32947} Procedures   Medications Ordered in the ED - No data to display    {Click here for ABCD2, HEART and other calculators REFRESH Note before signing:1}                              Medical Decision Making Amount and/or Complexity of Data Reviewed Labs: ordered. Radiology: ordered.  Risk Prescription drug management.   ***  {Document critical care time when appropriate  Document review of labs and clinical decision tools ie CHADS2VASC2, etc  Document your independent review of radiology images and any outside records  Document your discussion with family members, caretakers and with consultants  Document social determinants of health affecting pt's care  Document your decision making why or why not admission, treatments were needed:32947:::1}   Final diagnoses:  None    ED Discharge Orders     None

## 2024-10-09 NOTE — ED Triage Notes (Signed)
 Pt reports rt. Lower quad pain since yesterday.  Worse today. +dizziness

## 2024-10-12 DIAGNOSIS — K2289 Other specified disease of esophagus: Secondary | ICD-10-CM | POA: Diagnosis not present

## 2024-10-12 DIAGNOSIS — N281 Cyst of kidney, acquired: Secondary | ICD-10-CM | POA: Diagnosis not present

## 2024-10-12 DIAGNOSIS — M545 Low back pain, unspecified: Secondary | ICD-10-CM | POA: Diagnosis not present

## 2024-10-12 DIAGNOSIS — K449 Diaphragmatic hernia without obstruction or gangrene: Secondary | ICD-10-CM | POA: Diagnosis not present

## 2024-10-12 DIAGNOSIS — M62830 Muscle spasm of back: Secondary | ICD-10-CM | POA: Diagnosis not present

## 2024-10-12 DIAGNOSIS — R2689 Other abnormalities of gait and mobility: Secondary | ICD-10-CM | POA: Diagnosis not present

## 2024-10-12 DIAGNOSIS — E1140 Type 2 diabetes mellitus with diabetic neuropathy, unspecified: Secondary | ICD-10-CM | POA: Diagnosis not present

## 2024-10-18 DIAGNOSIS — R140 Abdominal distension (gaseous): Secondary | ICD-10-CM | POA: Diagnosis not present

## 2024-10-18 DIAGNOSIS — R9389 Abnormal findings on diagnostic imaging of other specified body structures: Secondary | ICD-10-CM | POA: Diagnosis not present
